# Patient Record
Sex: Female | Born: 1945 | Race: White | Hispanic: Yes | Marital: Married | Smoking: Never smoker
Health system: Southern US, Community
[De-identification: ages and names within clinical notes are randomized; demographics above are authoritative.]

## PROBLEM LIST (undated history)

## (undated) DIAGNOSIS — E119 Type 2 diabetes mellitus without complications: Secondary | ICD-10-CM

## (undated) DIAGNOSIS — I10 Essential (primary) hypertension: Secondary | ICD-10-CM

## (undated) DIAGNOSIS — E785 Hyperlipidemia, unspecified: Secondary | ICD-10-CM

## (undated) DIAGNOSIS — R569 Unspecified convulsions: Secondary | ICD-10-CM

## (undated) DIAGNOSIS — I639 Cerebral infarction, unspecified: Secondary | ICD-10-CM

## (undated) HISTORY — PX: REPLACEMENT TOTAL KNEE: SUR1224

## (undated) HISTORY — PX: HX BACK SURGERY: SHX140

## (undated) HISTORY — DX: Unspecified convulsions: R56.9

## (undated) HISTORY — PX: VEIN LIGATION: SHX2652

## (undated) HISTORY — PX: HX APPENDECTOMY: SHX54

## (undated) HISTORY — DX: Cerebral infarction, unspecified: I63.9

## (undated) HISTORY — PX: SHOULDER SURGERY: SHX246

## (undated) HISTORY — PX: JOINT REPLACEMENT: SHX530

---

## 2008-04-20 ENCOUNTER — Ambulatory Visit (HOSPITAL_COMMUNITY): Payer: Self-pay | Admitting: OBSTETRICS/GYNECOLOGY

## 2016-10-28 ENCOUNTER — Other Ambulatory Visit (INDEPENDENT_AMBULATORY_CARE_PROVIDER_SITE_OTHER): Payer: Self-pay | Admitting: Family Medicine

## 2016-10-28 MED ORDER — SITAGLIPTIN PHOSPHATE 50 MG-METFORMIN 1,000 MG TABLET
1.00 | ORAL_TABLET | Freq: Two times a day (BID) | ORAL | 3 refills | Status: DC
Start: 2016-10-28 — End: 2017-10-29

## 2016-10-28 NOTE — Telephone Encounter (Signed)
Regarding: refill  ----- Message from Johnny Bridge sent at 10/27/2016 11:46 AM EDT -----  Means    janument    Preferred Pharmacy     [facility] - [place], [place] - [address]    [address] [place] [address]    Phone: [phone] Fax: [phone]    Not a 24 hour pharmacy; exact hours not known

## 2016-11-27 ENCOUNTER — Other Ambulatory Visit (INDEPENDENT_AMBULATORY_CARE_PROVIDER_SITE_OTHER): Payer: Self-pay | Admitting: Family Medicine

## 2016-12-17 ENCOUNTER — Encounter (INDEPENDENT_AMBULATORY_CARE_PROVIDER_SITE_OTHER): Payer: Self-pay | Admitting: Family Medicine

## 2016-12-17 DIAGNOSIS — I10 Essential (primary) hypertension: Secondary | ICD-10-CM

## 2016-12-17 DIAGNOSIS — E039 Hypothyroidism, unspecified: Secondary | ICD-10-CM

## 2016-12-17 DIAGNOSIS — E782 Mixed hyperlipidemia: Secondary | ICD-10-CM

## 2016-12-17 DIAGNOSIS — G4733 Obstructive sleep apnea (adult) (pediatric): Secondary | ICD-10-CM

## 2016-12-17 DIAGNOSIS — E119 Type 2 diabetes mellitus without complications: Secondary | ICD-10-CM

## 2016-12-17 HISTORY — DX: Obstructive sleep apnea (adult) (pediatric): G47.33

## 2016-12-17 HISTORY — DX: Essential (primary) hypertension: I10

## 2016-12-17 HISTORY — DX: Mixed hyperlipidemia: E78.2

## 2016-12-17 HISTORY — DX: Type 2 diabetes mellitus without complications: E11.9

## 2016-12-17 HISTORY — DX: Hypothyroidism, unspecified: E03.9

## 2016-12-19 ENCOUNTER — Ambulatory Visit (INDEPENDENT_AMBULATORY_CARE_PROVIDER_SITE_OTHER): Payer: Medicare Other | Admitting: Family Medicine

## 2016-12-19 ENCOUNTER — Encounter (INDEPENDENT_AMBULATORY_CARE_PROVIDER_SITE_OTHER): Payer: Self-pay | Admitting: Family Medicine

## 2016-12-19 VITALS — BP 134/84 | HR 68 | Temp 98.0°F | Resp 16 | Ht 66.0 in | Wt 213.0 lb

## 2016-12-19 DIAGNOSIS — M25571 Pain in right ankle and joints of right foot: Secondary | ICD-10-CM

## 2016-12-19 DIAGNOSIS — G4733 Obstructive sleep apnea (adult) (pediatric): Secondary | ICD-10-CM

## 2016-12-19 DIAGNOSIS — Z23 Encounter for immunization: Secondary | ICD-10-CM

## 2016-12-19 DIAGNOSIS — G8929 Other chronic pain: Secondary | ICD-10-CM

## 2016-12-19 DIAGNOSIS — I10 Essential (primary) hypertension: Secondary | ICD-10-CM

## 2016-12-19 DIAGNOSIS — Z1239 Encounter for other screening for malignant neoplasm of breast: Secondary | ICD-10-CM

## 2016-12-19 DIAGNOSIS — E119 Type 2 diabetes mellitus without complications: Secondary | ICD-10-CM

## 2016-12-19 DIAGNOSIS — Z1231 Encounter for screening mammogram for malignant neoplasm of breast: Secondary | ICD-10-CM

## 2016-12-19 DIAGNOSIS — E039 Hypothyroidism, unspecified: Secondary | ICD-10-CM

## 2016-12-19 DIAGNOSIS — E782 Mixed hyperlipidemia: Principal | ICD-10-CM

## 2016-12-19 HISTORY — DX: Other chronic pain: G89.29

## 2016-12-19 HISTORY — DX: Pain in right ankle and joints of right foot: M25.571

## 2016-12-19 NOTE — Progress Notes (Signed)
[facility]  [address]  [place] [address]  Office Visit    Name: Linda [name]   DOB: [birth year]  Date of Service: 12/19/2016     Chief Complaint(s):   Chief Complaint   Patient presents with   . Diabetes Follow up   . Hypertension   . Hyperlipidemia   . Hypothyroidism       SUBJECTIVE:  She has been doing well and she has been really trying to watch her diet and she has been staying active. She states that her bs has been very good at home and she denies any hypo or hyperglycemic episodes. She denies any issues with the medications. She is having the pain at the right foot. She has seen ortho for this and she did have an injection and she did also have pt but still no relief. She states that th ankle feels like it is burning and this is constant pain. The pain is below the ankle and posteriorly. She denies any cp or sob. She did have the labs done and her labs are stable but they did not do an a1c this time but her last a1c was 6.2.    ROS:  Constitutional: Denies fevers, chills, night sweats. No recent weight changes or fatigue.   Eyes: Denies change in vision. No irritation, erythema, discharge or pain.   Ears: Denies any difficulty with hearing. No ear pain, tinnitus or discharge.  Mouth/Throat: Denies any oral ulcers or other lesions. No sore throat, hoarseness or dysphagia.  Cardiovascular: Denies any chest pain, palpitations, PND or DOE  Respiratory: Denies any shortness of breath, wheezing, cough   GI: Denies any abdominal pain, nausea, vomiting, diarrhea or constipation. No melena or hematochezia.  GU: Denies any dysuria, frequency, hematuria, hesitancy. Denies nocturia or incontinence.  Musculoskeletal: Does have the right ankle pain and burning.  Neurological: No history of syncope or seizures. Denies any dizziness or headaches.  Emotional/Psychiatric: Denies any memory issues. No history of depression or anxiety.  Skin: Denies any recent rashes or lesions.     Past Medical  History:  Patient Active Problem List    Diagnosis Date Noted   . Chronic pain of right ankle 12/19/2016   . Type 2 diabetes mellitus without complication, without long-term current use of insulin (CMS HCC) 12/17/2016   . Mixed hyperlipidemia 12/17/2016   . Essential hypertension, benign 12/17/2016   . Obstructive sleep apnea 12/17/2016   . Hypothyroidism, adult 12/17/2016       Medications:  Current Outpatient Prescriptions   Medication Sig   . glimepiride (AMARYL) 2 mg Oral Tablet take 1 tablet by mouth once daily   . levothyroxine (SYNTHROID) 125 mcg Oral Tablet Take 125 mcg by mouth Once a day   . losartan (COZAAR) 50 mg Oral Tablet Take 50 mg by mouth Once a day   . pravastatin (PRAVACHOL) 80 mg Oral Tablet Take 80 mg by mouth Once a day   . sitaGLIPtin-metformin (JANUMET) 50-1,000 mg Oral Tablet Take 1 Tab by mouth Twice daily with food        Allergies:  No Known Allergies     Social History:  Social History   Substance Use Topics   . Smoking status: Never Smoker   . Smokeless tobacco: Never Used   . Alcohol use No        Family History:  Family Medical History:     Problem Relation (Age of Onset)    Leukemia Mother  Lymphoma Father              Objective:  BP 134/84  Pulse 68  Temp 36.7 C (98 F)  Resp 16  Ht 1.676 m ( )  Wt 96.6 kg (213 lb)  SpO2 98%  BMI 34.38 kg/m2     Physical Exam:  General: No apparent acute distress. Very pleasant.   Eyes: Conjunctiva are clear. No discharge or icterus noted.  HENT: Mucous membranes are moist. Nares are clear. Posterior oropharynx is clear without erythema or exudates.    Neck: Supple.  Thyroid palpated as normal  Lungs: Clear to auscultation bilaterally. Good air movement. No wheezes or rhonchi.    Cardiovascular: Normal rate and regular rhythm. No murmurs, rubs or gallops.  Abdomen: Soft. BS present. Non-tender. No rebound, guarding, or peritoneal signs.   Extremities: Atraumatic. No cyanosis. No significant peripheral edema.  Skin: Warm and dry. No  significant rashes or lesions  Neurologic: Strength and sensation grossly normal throughout. Normal gait.    Psychiatric: Alert and oriented x 3. Affect within normal limits.  Musculoskeletal: Extremities intact x 4. Spine full range of motion without restrictions. No gross deformities.  BMI addressed: Advised on diet, weight loss, and exercise to reduce above normal BMI.              POCT Results:                 I have reviewed and confirmed the above point of care results.    Assessment and Plan:    ICD-10-CM    1. Mixed hyperlipidemia E78.2 Lipid Panel   2. Essential hypertension, benign I10 CBC     Comp Metabolic Panel- Fasting   3. Obstructive sleep apnea G47.33    4. Type 2 diabetes mellitus without complication, without long-term current use of insulin (CMS HCC) E11.9 A1C with Estimated Average Glucose   5. Hypothyroidism, adult E03.9    6. Chronic pain of right ankle M25.571     G89.29    7. Screening for breast cancer Z12.31 Mammogram: Screening Bilateral       Orders Placed This Encounter   . Mammogram: Screening Bilateral   . A1C with Estimated Average Glucose   . CBC   . Comp Metabolic Panel- Fasting   . Lipid Panel           We did review patient diabetes status and need for continued diet, exercise and regular follow up with labs and A1c. We will repeat the a1c as this was not done on the current labs. We did rec yearly ophthalmology exams and also regular foot exam and did review foot care. Patient will continue to monitor blood glucose and call with any issues or concerns. She will cont f/u with orthopedics for the ankle problems. She will have the labs prior to her next visit. Patient has been [facility] patient with [facility] and has been seen within the previous 3 years.        Algis Downs [employer] DO 12/19/2016, 08:45        Portions of this note may be dictated using voice recognition software. Variances in spelling and vocabulary are possible and unintentional. Not all errors are  caught/corrected. Please notify the Thereasa Parkin if any discrepancies are noted or if the meaning of any statement is not clear.

## 2016-12-19 NOTE — Nursing Note (Signed)
Patient here for check up doing well she tries to watch diet and stay active  she denies chest pain or sob she does c/o right ankle pain seeing ortho Solmon Ice, MA  12/19/2016, 08:22

## 2016-12-19 NOTE — Nurses Notes (Signed)
Patient received vaccine in clinic.  Tolerated it well, given VIS sheet and was discharged to home.  Immunization administered     Name Date Dose VIS Date Route    INFLUENZA VACCINE IM 12/19/2016 0.5 mL 10/14/2013 Intramuscular    Site: Right deltoid    Given By: Solmon Ice, MA    Manufacturer: [employer]    Lot: [id number]    NDC: [id number]    Prevnar 13 (Admin) 12/19/2016 0.5 mL 07/22/2016 Intramuscular    Site: Right deltoid    Given By: Solmon Ice, MA    Manufacturer: [employer]    Lot: [id number]    NDC: [id number]            Solmon Ice, MA  12/19/2016, 17:10

## 2017-03-04 ENCOUNTER — Other Ambulatory Visit (INDEPENDENT_AMBULATORY_CARE_PROVIDER_SITE_OTHER): Payer: Self-pay | Admitting: Family Medicine

## 2017-05-27 ENCOUNTER — Ambulatory Visit (INDEPENDENT_AMBULATORY_CARE_PROVIDER_SITE_OTHER): Payer: Self-pay | Admitting: Family Medicine

## 2017-05-27 NOTE — Telephone Encounter (Signed)
-----   Message from Ronette Deteronya Baker sent at 05/27/2017 11:22 AM EDT -----  Jacob MooresPaul E Means Jr., DO    Pt called for refill, states she is completely out and pharmacy has sent a fax for this refill.  Thank you     levothyroxine (SYNTHROID) 125 mcg Oral Tablet   08/02/2016    Sig - Route: Take 125 mcg by mouth Once a day - Oral     Preferred Pharmacy     [facility] - [place], [place] - [address]    [address] [place] [id number]    Phone: [phone] Fax: [phone]    Not a 24 hour pharmacy; exact hours not known

## 2017-05-28 MED ORDER — LEVOTHYROXINE 125 MCG TABLET
125.0000 ug | ORAL_TABLET | Freq: Every day | ORAL | 1 refills | Status: DC
Start: 2017-05-28 — End: 2017-11-13

## 2017-05-29 ENCOUNTER — Other Ambulatory Visit (INDEPENDENT_AMBULATORY_CARE_PROVIDER_SITE_OTHER): Payer: Self-pay | Admitting: Family Medicine

## 2017-10-29 ENCOUNTER — Other Ambulatory Visit (INDEPENDENT_AMBULATORY_CARE_PROVIDER_SITE_OTHER): Payer: Self-pay | Admitting: Family Medicine

## 2017-11-11 ENCOUNTER — Other Ambulatory Visit (INDEPENDENT_AMBULATORY_CARE_PROVIDER_SITE_OTHER): Payer: Self-pay | Admitting: Family Medicine

## 2017-11-13 ENCOUNTER — Other Ambulatory Visit (INDEPENDENT_AMBULATORY_CARE_PROVIDER_SITE_OTHER): Payer: Self-pay | Admitting: Family Medicine

## 2017-11-23 ENCOUNTER — Ambulatory Visit (INDEPENDENT_AMBULATORY_CARE_PROVIDER_SITE_OTHER): Payer: No Typology Code available for payment source

## 2017-11-23 ENCOUNTER — Encounter (FREE_STANDING_LABORATORY_FACILITY): Payer: Medicare Other | Admitting: Family Medicine

## 2017-11-23 ENCOUNTER — Encounter (FREE_STANDING_LABORATORY_FACILITY)
Admit: 2017-11-23 | Discharge: 2017-11-23 | Disposition: A | Discharge status: Home / Self Care | Payer: Medicare Other | Attending: Family Medicine | Admitting: Family Medicine

## 2017-11-23 DIAGNOSIS — E782 Mixed hyperlipidemia: Secondary | ICD-10-CM

## 2017-11-23 DIAGNOSIS — E119 Type 2 diabetes mellitus without complications: Secondary | ICD-10-CM | POA: Insufficient documentation

## 2017-11-23 DIAGNOSIS — I10 Essential (primary) hypertension: Secondary | ICD-10-CM

## 2017-11-23 LAB — CBC
HCT: 42.6 % (ref 34.8–46.0)
HGB: 13.7 g/dL (ref 11.5–16.0)
MCH: 30.5 pg (ref 26.0–32.0)
MCHC: 32.2 g/dL (ref 31.0–35.5)
MCV: 94.9 fL (ref 78.0–100.0)
MPV: 9.9 fL (ref 8.7–12.5)
PLATELETS: 246 x10ˆ3/uL (ref 150–400)
RBC: 4.49 x10ˆ6/uL (ref 3.85–5.22)
RDW-CV: 13.6 % (ref 11.5–15.5)
WBC: 5.6 x10ˆ3/uL (ref 3.7–11.0)

## 2017-11-23 LAB — COMPREHENSIVE METABOLIC PNL, FASTING
ALBUMIN: 4.5 g/dL (ref 3.4–4.8)
ALKALINE PHOSPHATASE: 64 U/L (ref 55–145)
ALT (SGPT): 15 U/L (ref <55)
ANION GAP: 6 mmol/L (ref 4–13)
AST (SGOT): 22 U/L (ref 8–41)
BILIRUBIN TOTAL: 0.6 mg/dL (ref 0.3–1.3)
BUN/CREA RATIO: 21 (ref 6–22)
BUN: 16 mg/dL (ref 8–25)
CALCIUM: 10.1 mg/dL (ref 8.5–10.2)
CHLORIDE: 109 mmol/L (ref 96–111)
CO2 TOTAL: 26 mmol/L (ref 22–32)
CREATININE: 0.78 mg/dL (ref 0.49–1.10)
ESTIMATED GFR: >59 mL/min/1.73m?2 (ref >59)
ESTIMATED GFR: >59 mL/min/{1.73_m2} (ref >59)
GLUCOSE: 122 mg/dL — ABNORMAL HIGH (ref 70–105)
POTASSIUM: 4.5 mmol/L (ref 3.5–5.1)
PROTEIN TOTAL: 7.5 g/dL (ref 6.0–8.0)
SODIUM: 141 mmol/L (ref 136–145)

## 2017-11-23 LAB — HGA1C (HEMOGLOBIN A1C WITH EST AVG GLUCOSE)
ESTIMATED AVERAGE GLUCOSE: 126 mg/dL
HEMOGLOBIN A1C: 6 % — ABNORMAL HIGH (ref 4.0–5.6)

## 2017-11-23 LAB — LIPID PANEL
CHOL/HDL RATIO: 4.3
CHOLESTEROL: 171 mg/dL (ref <200)
HDL CHOL: 40 mg/dL — ABNORMAL LOW (ref >=49)
LDL CALC: 100 mg/dL (ref <=100)
NON-HDL: 131 mg/dL (ref <=190)
TRIGLYCERIDES: 156 mg/dL — ABNORMAL HIGH (ref <=150)
VLDL CALC: 31 mg/dL — ABNORMAL HIGH (ref <30)

## 2017-11-23 NOTE — Nursing Note (Signed)
Department of Community Practice     Venipuncture performed in office on right arm antecubital vein, dry pressure dressing was applied to site and patient tolerated it well.  Specimen was centrifuged, aliquoted as needed and specimen was labeled and packaged for transport.  Garnet Overfield [place], MA  11/23/2017, 10:53

## 2017-11-24 ENCOUNTER — Other Ambulatory Visit (INDEPENDENT_AMBULATORY_CARE_PROVIDER_SITE_OTHER): Payer: Self-pay | Admitting: Family Medicine

## 2017-11-24 ENCOUNTER — Encounter (FREE_STANDING_LABORATORY_FACILITY): Payer: No Typology Code available for payment source | Attending: Family Medicine | Admitting: Family Medicine

## 2017-11-24 ENCOUNTER — Ambulatory Visit (INDEPENDENT_AMBULATORY_CARE_PROVIDER_SITE_OTHER): Payer: Medicare Other | Admitting: Family Medicine

## 2017-11-24 VITALS — BP 120/80 | HR 73 | Temp 98.0°F | Resp 16 | Ht 66.0 in | Wt 208.0 lb

## 2017-11-24 DIAGNOSIS — E119 Type 2 diabetes mellitus without complications: Secondary | ICD-10-CM

## 2017-11-24 DIAGNOSIS — E039 Hypothyroidism, unspecified: Secondary | ICD-10-CM

## 2017-11-24 DIAGNOSIS — G4733 Obstructive sleep apnea (adult) (pediatric): Secondary | ICD-10-CM

## 2017-11-24 DIAGNOSIS — I10 Essential (primary) hypertension: Secondary | ICD-10-CM

## 2017-11-24 DIAGNOSIS — E782 Mixed hyperlipidemia: Secondary | ICD-10-CM

## 2017-11-24 DIAGNOSIS — Z0000 Encounter for general adult medical examination without abnormal findings: Secondary | ICD-10-CM

## 2017-11-24 NOTE — Nursing Note (Signed)
11/24/17 1253   Medicare Wellness Assessment   Medicare initial or wellness physical in the last year? Yes   Advance Directives   Does patient have a living will or MPOA no   Has patient provided [facility] with a copy? no   Advance directive information given to the patient today? no   Activities of Daily Living   Do you need help with dressing, bathing, or walking? No   Do you need help with shopping, housekeeping, medications, or finances? No   Do you have rugs in hallways, broken steps, or poor lighting? No   Do you have grab bars in your bathroom, non-slip strips in your tub, and hand rails on your stairs? No   Urinary Incontinence Screen-Women only   Do you ever leak urine when you don't want to? YES   Cognitive Function Screen   What is you age? 1   What is the time to the nearest hour? 1   What is the year? 1   What is the name of this clinic? 1   Can the patient recognize two persons (the doctor, the nurse, home help, etc.)? 1   What is the date of your birth? (day and month sufficient)  1   In what year did World War II end? 0   Who is the current president of the [place]? 1   Count from 20 down to 1? 1   What address did I give you earlier? 0   Total Score 8   Depression Screen   Little interest or pleasure in doing things. 0   Feeling down, depressed, or hopeless 0   PHQ 2 Total 0   Hearing Screen   Have you noticed any hearing difficulties? Yes   After whispering 9-1-6 how many numbers did the patient repeat correctly? 1   After whispering 4-7-8 how many numbers did the patient repeat correctly? 1   Total Correct 2   Fall Risk Assessment   Do you feel unsteady when standing or walking? No   Do you worry about falling? No   Have you fallen in the past year? No   How many times have you fallen? Once   Were you ever injured from falling? Yes   Vision Screen   Right Eye = 20 30   Left Eye = 20 30

## 2017-11-24 NOTE — Progress Notes (Signed)
[facility]  [address]  [place] [id number]  [facility]  Medicare Annual Wellness Visit    Name: Linda MartiniCarol Bayley MRN:  [id number]   Date: 11/24/2017 Age: 72 y.o.       SUBJECTIVE:   Linda [name] is a 72 y.o. female for presenting for Medicare Wellness exam.   I have reviewed and reconciled the medication list with the patient today.  DM: Fasting FS range 100   Nonfasting FS range 120   Hypoglycemia episodes  No   Compliant with diabetic diet Yes   Sores on feet No  Diabetes Monitors  A1C: 6  A1C Date: 11/23/2017          Urine Microalbumin: Not Found   Microalbumin Date: Not Found    Last Lipid Panel  (Last result in the past 2 years)      Cholesterol   HDL   LDL   Direct LDL   Triglycerides      11/23/17 0904 171 40 100   156        Retinal Exam Date: Not Found  Last diabetic foot exam: Not Found  HTN: HAs No    Dizziness No   Feeling like BP is too high or low No   Compliant with taking meds Yes   Home BP:  not doing  HLD: at goal     "healthy" diet  in general   Is pt fasting today? No  She has been doing much better and she is trying to lose weight as she is going to have tkra done in October. She is having increased pain at the knee and this is limiting her activity. Her bs have really improved and her a1c has been excellent. She denies any issues with the medications.   I have reviewed and updated as appropriate the past medical, family and social history. 11/24/2017 as summarized below:  Past Medical History:   Diagnosis Date   . Chronic pain of right ankle 12/19/2016   . Essential hypertension, benign 12/17/2016   . Hypothyroidism, adult 12/17/2016   . Mixed hyperlipidemia 12/17/2016   . Obstructive sleep apnea 12/17/2016   . Type 2 diabetes mellitus without complication, without long-term current use of insulin (CMS HCC) 12/17/2016     Past Surgical History:   Procedure Laterality Date   . Hx appendectomy     . Hx back surgery     . Shoulder surgery     . Vein ligation        Current Outpatient Medications   Medication Sig   . glimepiride (AMARYL) 2 mg Oral Tablet take 1 tablet by mouth once daily   . JANUMET 50-1,000 mg Oral Tablet take 1 tablet by mouth twice a day with food   . levothyroxine (SYNTHROID) 125 mcg Oral Tablet take 1 tablet by mouth once daily   . losartan (COZAAR) 50 mg Oral Tablet take 1 tablet by mouth once daily   . pravastatin (PRAVACHOL) 80 mg Oral Tablet take 1 tablet by mouth once daily     Family Medical History:     Problem Relation (Age of Onset)    Leukemia Mother    Lymphoma Father            Social History     Socioeconomic History   . Marital status: Married     Spouse name: Not on file   . Number of children: Not on file   . Years of education: Not on  file   . Highest education level: Not on file   Tobacco Use   . Smoking status: Never Smoker   . Smokeless tobacco: Never Used   Substance and Sexual Activity   . Alcohol use: No     Review of Systems: Pertinent items are noted in HPI.     List of Current Health Care Providers   Care Team     PCP     Name Type Specialty Phone Number    Means, Hipolito Bayley., DO Physician [facility] [phone]          Care Team     No care team found                  Health Maintenance   Topic Date Due   . Adult Tdap-Td (1 - Tdap) 04/20/1965   . Hepatitis C screening antibody  04/21/1991   . Mammography  04/20/1996   . Shingles Vaccine (1 of 2) 04/20/1996   . Colonoscopy  04/20/1996   . Osteoporosis screening  04/21/2011   . Annual Wellness Exam  10/22/2016   . Influenza Vaccine (1) 11/08/2017   . Pneumococcal 65+ Years Low Risk (2 of 2 - PPSV23) 12/19/2017   . Depression Screening  12/19/2017     Medicare Wellness Assessment   Medicare initial or wellness physical in the last year?: Yes  Advance Directives (optional)   Does patient have a living will or MPOA: no   Has patient provided [employer] with a copy?: no   Advance directive information given to the patient today?: no      Activities of Daily Living   Do  you need help with dressing, bathing, or walking?: No   Do you need help with shopping, housekeeping, medications, or finances?: No   Do you have rugs in hallways, broken steps, or poor lighting?: No   Do you have grab bars in your bathroom, non-slip strips in your tub, and hand rails on your stairs?: No   Urinary Incontinence Screen (Women >=65 only)   Do you ever leak urine when you don't want to?: YES   Cognitive Function Screen (1=Yes, 0=No)   What is you age?: Correct   What is the time to the nearest hour?: Correct   What is the year?: Correct   What is the name of this clinic?: Correct   Can the patient recognize two persons (the doctor, the nurse, home help, etc.)?: Correct   What is the date of your birth? (day and month sufficient) : Correct   In what year did World War II end?: Incorrect   Who is the current president of the [place]?: Correct   Count from 20 down to 1?: Correct   What address did I give you earlier?: Incorrect   Total Score: 8       Hearing Screen   Have you noticed any hearing difficulties?: Yes  After whispering 9-1-6 how many numbers did the patient repeat correctly?: 1  After whispering 4-7-8 how many numbers did the patient repeat correctly?: 1   Fall Risk Screen   Do you feel unsteady when standing or walking?: No  Do you worry about falling?: No  Have you fallen in the past year?: No  How many times have you fallen?: Once  Were you ever injured from falling?: Yes   Vision Screen   Right Eye = 20: 30   Left Eye = 20: 30   Depression Screen  Little interest or pleasure in doing things.: Not at all  Feeling down, depressed, or hopeless: Not at all  PHQ 2 Total: 0        OBJECTIVE:   BP 120/80   Pulse 73   Temp 36.7 C (98 F)   Resp 16   Ht 1.676 m (5\' 6" )   Wt 94.3 kg (208 lb)   SpO2 97%   BMI 33.57 kg/m        Other appropriate exam:  General: Patient is very pleasant and cooperative and in no acute distress.  HEENT: Perl, eomi, tm intact. Nares clear. Oral mucosa  pink and moist. No Lesions. TM intact bilaterally.  Neck: Soft, Supple, no masses, Thyroid palpated and not enlarged.  Heart: Regular rate and rhythm and no murmurs, clicks or rubs.  Lungs: Clear to auscultation bilaterally. Good air movement. No wheezing or rhochi.  Abdomen: Soft, non tender, no masses , no organomegaly. + normoactive/ normopitch bowel sounds.  Extremities: Intact x 4. no gross deformity and no edema.  Musculoskeletal: knees with decreased rom and joint line tenderness and pain with rom  Neurologic: Strength and sensation grossly equal and appropriate. No focal deficits noted.  Skin: No lesions or rashes noted.    Health Maintenance Due   Topic Date Due   . Adult Tdap-Td (1 - Tdap) 04/20/1965   . Hepatitis C screening antibody  04/21/1991   . Mammography  04/20/1996   . Shingles Vaccine (1 of 2) 04/20/1996   . Colonoscopy  04/20/1996   . Osteoporosis screening  04/21/2011   . Annual Wellness Exam  10/22/2016   . Influenza Vaccine (1) 11/08/2017      ASSESSMENT & PLAN:    Identified Risk Factors/ Recommended Actions     ICD-10-CM    1. Medicare annual wellness visit, subsequent Z00.00    2. Mixed hyperlipidemia E78.2 Lipid Panel   3. Essential hypertension, benign I10 CBC     Comp Metabolic Panel- Fasting   4. Obstructive sleep apnea G47.33    5. Type 2 diabetes mellitus without complication, without long-term current use of insulin (CMS HCC) E11.9 Hemoglobin A1C   6. Hypothyroidism, adult E03.9 TSH Sensitive     Fall Risk Follow up plan of care: Footwear and potential problems addressed    Urinary Incontinence Plan of Care: Behavioral interventions with bladder training, pelvic floor muscle training, and/or prompted voiding  Orders Placed This Encounter   . CBC   . Comp Metabolic Panel- Fasting   . Lipid Panel   . Hemoglobin A1C   . TSH Sensitive          The patient has been educated about risk factors and recommended preventive care. Written Prevention Plan completed/ updated and given to patient  (see After Visit Summary).  We did review patient diabetes status and need for continued diet, exercise and regular follow up with labs and A1c. Her a1c is excellent and her other labs are stable. I did rec that continue her current medications. She will continue follow up with orthopedics for her knee oa and will have the right tkra. We did rec yearly ophthalmology exams and also regular foot exam and did review foot care. Patient will continue to monitor blood glucose and call with any issues or concerns.  Return in about 3 months (around 02/23/2018).    Jacob Moores., DO  [facility]  [facility]  [address]  [place] [address]  [phone]

## 2017-11-24 NOTE — Patient Instructions (Signed)
Medicare Preventive Services  Medicare coverage information Recommendation for YOU   Heart Disease and Diabetes   Lipid profile every 5 years or more often if at risk for cardiovascular disease  Last Lipid Panel  (Last result in the past 2 years)      Cholesterol   HDL   LDL   Direct LDL   Triglycerides      11/23/17 0904 171 40 100   156         Diabetes Screening  yearly for those at risk for diabetes, 2 tests per year for those with prediabetes Last Glucose: 122    Diabetes Self Management Training or Medical Nutrition Therapy  for those with diabetes, up to 10 hrs initial training within a year, subsequent years up to 2 hrs of follow up training Optional for those with diabetes     Medical Nutrition Therapy three hours of one-on-one counseling in first year, two hours in subsequent years Optional for those with diabetes, kidney disease   Intensive Behavioral Therapy for Obesity  Face-to-face counseling, first month every week, month 2-6 every other week, month 7-12 every month if continued progress is documented Optional for those with Body Mass Index 30 or higher  Your Body mass index is 33.57 kg/m.   Tobacco Cessation (Quitting) Counseling   two attempts per year, max 4 sessions per attempt, up to 8 per year, for those with tobacco-related health condition Optional for those that use tobacco   Cancer Screening   Colorectal screening   for anyone age 40 to 2 or any age if high risk:  . Screening Colonoscopy every 10 yrs if low risk,  more frequent if higher risk  OR  . Flexible  Sigmoidoscopy  every 5 yr OR  . Fecal Occult Blood Testing yearly OR  . Cologuard Stool DNA test once every 3 years OR  . CT Colonography every 5 yrs    See your schedule below   Screening Pap Test recommended every 3 years for all women age 28 to 55, or every five years if combined with HPV test (routine screening not needed after total hysterectomy).  Medicare covers every 2 years, up to yearly if high risk.  Screening Pelvic Exam  Medicare covers every 2 years, yearly if high risk or childbearing age with abnormal Pap in last 3 yrs. See your schedule below   Screening Mammogram   Recommended every 1-2 years for women age 27 to 66, and selectively recommended for women between 82-49 based on shared decisions about risk. Covered by Medicare up to every year for women age 72 or older See your schedule below   Lung Cancer Screening  Annual low dose computed tomography (LDCT scan) is recommended for those age 3-77 who smoked 30 pack-years and are current smokers or quit smoking within past 15 years (one pack-year= smoking one PPD for one year), after counseling by your doctor or nurse clinician about the possible benefits or harms See your schedule below   Vaccinations   Pneumococcal Vaccine recommended routinely age 55+ with two separate vaccines one year apart (Prevnar then Pneumovax).  Recommended before age 71 if medical conditions increase risk  Seasonal Influenza Vaccine once every flu season   Hepatitis B Vaccine 3 doses if risk (including anyone with diabetes or liver disease)  Shingles Vaccine Once or twice at age 89 or older  Diphtheria Tetanus Pertussis Vaccine ONCE as adult, booster every 10 years     Immunization History   Administered  Date(s) Administered   . Influenza Vaccine IM (ADMIN) 12/19/2016   . Prevnar 13 (Admin) 12/19/2016     Shingles vaccine and Diphtheria Tetanus Pertussis vaccines are available at pharmacies or local health department without a prescription.   Other Screening   Bone Densitometry   every 24 months for anyone at risk, including postmenopausal       Glaucoma Screening   yearly if in high risk group such as diabetes, family history, African American age 71+ or Hispanic American age 70+      Hepatitis C Screening recommended ONCE for those born between 1945-1965, or high risk for HCV infection     HIV Testing   yearly or up to 3 times in pregnancy     Abdominal Aortic Aneurysm Screening Ultrasound   once  with Initial Welcome to Medicare Physical within 12 months of coverage if  65 + with family history of AAA       Your Personalized Schedule for Preventive Tests   Health Maintenance: Pending and Last Completed       Date Due Completion Date    Adult Tdap-Td (1 - Tdap) 04/20/1965 ---    Hepatitis C screening antibody 04/21/1991 ---    Mammography 04/20/1996 ---    Shingles Vaccine (1 of 2) 04/20/1996 ---    Colonoscopy 04/20/1996 ---    Osteoporosis screening 04/21/2011 ---    Annual Wellness Exam 10/22/2016 ---    Influenza Vaccine (1) 11/08/2017 12/19/2016    Pneumococcal 65+ Years Low Risk (2 of 2 - PPSV23) 12/19/2017 12/19/2016    Depression Screening 12/19/2017 12/19/2016

## 2017-11-30 ENCOUNTER — Other Ambulatory Visit (INDEPENDENT_AMBULATORY_CARE_PROVIDER_SITE_OTHER): Payer: Self-pay | Admitting: Family Medicine

## 2017-12-04 ENCOUNTER — Ambulatory Visit (INDEPENDENT_AMBULATORY_CARE_PROVIDER_SITE_OTHER): Payer: Self-pay | Admitting: Family Medicine

## 2017-12-04 ENCOUNTER — Ambulatory Visit (INDEPENDENT_AMBULATORY_CARE_PROVIDER_SITE_OTHER): Payer: Self-pay | Admitting: Family

## 2017-12-04 NOTE — Telephone Encounter (Signed)
faxed.    Starleen Arms, Ambulatory Care Assistant  12/04/2017, 13:27

## 2017-12-04 NOTE — Telephone Encounter (Signed)
Please advise, thank you.    Starleen Arms, Ambulatory Care Assistant  12/04/2017, 13:16

## 2017-12-04 NOTE — Telephone Encounter (Signed)
Regarding: Fax   ----- Message from Jill Alexanders sent at 12/04/2017  1:07 PM EDT -----  Jacob Moores., DO  DR. Dijoya's office  Was Calling to find out if patient was cleared for surgery, She saw Dr. Charlestine Night on the 17th of this month     Patient is Scheduled for a total knee replacement on  October 22nd  And they were needing office notes from her visit on the 17th.     Fax [phone]

## 2018-01-07 ENCOUNTER — Other Ambulatory Visit (INDEPENDENT_AMBULATORY_CARE_PROVIDER_SITE_OTHER): Payer: Self-pay | Admitting: Family Medicine

## 2018-02-09 ENCOUNTER — Other Ambulatory Visit (INDEPENDENT_AMBULATORY_CARE_PROVIDER_SITE_OTHER): Payer: Self-pay | Admitting: Family Medicine

## 2018-02-10 ENCOUNTER — Other Ambulatory Visit (INDEPENDENT_AMBULATORY_CARE_PROVIDER_SITE_OTHER): Payer: Self-pay | Admitting: Family Medicine

## 2018-02-22 ENCOUNTER — Other Ambulatory Visit (INDEPENDENT_AMBULATORY_CARE_PROVIDER_SITE_OTHER): Payer: Self-pay | Admitting: Family Medicine

## 2018-03-07 ENCOUNTER — Other Ambulatory Visit (INDEPENDENT_AMBULATORY_CARE_PROVIDER_SITE_OTHER): Payer: Self-pay | Admitting: Family Medicine

## 2018-04-09 ENCOUNTER — Other Ambulatory Visit (INDEPENDENT_AMBULATORY_CARE_PROVIDER_SITE_OTHER): Payer: Self-pay | Admitting: Family Medicine

## 2018-04-12 ENCOUNTER — Other Ambulatory Visit (INDEPENDENT_AMBULATORY_CARE_PROVIDER_SITE_OTHER): Payer: Self-pay | Admitting: Family Medicine

## 2018-05-11 ENCOUNTER — Other Ambulatory Visit (INDEPENDENT_AMBULATORY_CARE_PROVIDER_SITE_OTHER): Payer: Self-pay | Admitting: Family Medicine

## 2018-05-13 ENCOUNTER — Other Ambulatory Visit (INDEPENDENT_AMBULATORY_CARE_PROVIDER_SITE_OTHER): Payer: Self-pay | Admitting: Family Medicine

## 2018-05-14 ENCOUNTER — Other Ambulatory Visit (INDEPENDENT_AMBULATORY_CARE_PROVIDER_SITE_OTHER): Payer: Self-pay | Admitting: Family

## 2018-05-22 ENCOUNTER — Other Ambulatory Visit (INDEPENDENT_AMBULATORY_CARE_PROVIDER_SITE_OTHER): Payer: Self-pay | Admitting: Family Medicine

## 2018-05-24 ENCOUNTER — Other Ambulatory Visit (INDEPENDENT_AMBULATORY_CARE_PROVIDER_SITE_OTHER): Payer: Self-pay | Admitting: Family Medicine

## 2018-05-24 MED ORDER — BLOOD SUGAR DIAGNOSTIC STRIPS
ORAL_STRIP | 3 refills | Status: DC
Start: 2018-05-24 — End: 2020-10-23

## 2018-05-24 NOTE — Telephone Encounter (Signed)
-----   Message from Gerhard Munch sent at 05/24/2018  9:54 AM EDT -----  Jacob Moores., DO    Refills    Contour next test strips    Preferred Pharmacy     [facility] - [place], [place] - [address]    [address] [place] [address]    Phone: [phone] Fax: [phone]    Not a 24 hour pharmacy; exact hours not known.

## 2018-05-25 ENCOUNTER — Encounter (INDEPENDENT_AMBULATORY_CARE_PROVIDER_SITE_OTHER): Payer: Self-pay

## 2018-05-25 ENCOUNTER — Ambulatory Visit (INDEPENDENT_AMBULATORY_CARE_PROVIDER_SITE_OTHER): Payer: Self-pay | Admitting: Family Medicine

## 2018-06-14 ENCOUNTER — Other Ambulatory Visit (INDEPENDENT_AMBULATORY_CARE_PROVIDER_SITE_OTHER): Payer: Self-pay | Admitting: Family

## 2018-06-14 NOTE — Nursing Note (Signed)
Last scheduled appointment with you was Visit date not found.  Currently scheduled future appointment is Visit date not found.      Mertie Clause, MA  06/14/2018, 10:25

## 2018-06-17 ENCOUNTER — Ambulatory Visit (INDEPENDENT_AMBULATORY_CARE_PROVIDER_SITE_OTHER): Payer: Self-pay | Admitting: Family Medicine

## 2018-06-17 NOTE — Telephone Encounter (Signed)
Regarding: Consult   ----- Message from Jill Alexanders sent at 06/17/2018  8:50 AM EDT -----  Jacob Moores., DO    DWO form for medicare please fax back as soon as possible so they can dispense medication     Blood Sugar Diagnostic (CONTOUR NEXT TEST STRIPS) Strip 100 Strip 3 05/24/2018    Sig: check BS twice a day   Sent to pharmacy as: blood sugar diagnostic strips (Contour Next Test Strips)   Class: E-Rx   Notes to Pharmacy: dx e11.65 and npi [id number]   Non-formulary Exception Code: RXHUB/No Formulary Info Available       Preferred Pharmacy     [facility] - [place], [place] - [address]    [address] [place] [address]    Phone: [phone] Fax: [phone]    Not a 24 hour pharmacy; exact hours not known.

## 2018-06-21 ENCOUNTER — Other Ambulatory Visit (INDEPENDENT_AMBULATORY_CARE_PROVIDER_SITE_OTHER): Payer: Self-pay | Admitting: Family Medicine

## 2018-06-21 NOTE — Nursing Note (Signed)
Last scheduled appointment with you was Visit date not found.  Currently scheduled future appointment is Visit date not found.      Mertie Clause, MA  06/21/2018, 17:39

## 2018-07-06 ENCOUNTER — Other Ambulatory Visit (INDEPENDENT_AMBULATORY_CARE_PROVIDER_SITE_OTHER): Payer: Self-pay | Admitting: Family Medicine

## 2018-07-06 MED ORDER — GLIMEPIRIDE 2 MG TABLET
ORAL_TABLET | ORAL | 3 refills | Status: DC
Start: 2018-07-06 — End: 2018-08-30

## 2018-07-14 ENCOUNTER — Other Ambulatory Visit (INDEPENDENT_AMBULATORY_CARE_PROVIDER_SITE_OTHER): Payer: Self-pay | Admitting: Family Medicine

## 2018-07-14 NOTE — Telephone Encounter (Signed)
Pt LOV 11/24/2017.       Starleen Arms, Ambulatory Care Assistant  07/14/2018, 09:50

## 2018-07-26 ENCOUNTER — Telehealth (INDEPENDENT_AMBULATORY_CARE_PROVIDER_SITE_OTHER): Payer: Self-pay | Admitting: Family

## 2018-07-26 ENCOUNTER — Ambulatory Visit (INDEPENDENT_AMBULATORY_CARE_PROVIDER_SITE_OTHER): Payer: Self-pay | Admitting: Family Medicine

## 2018-07-26 NOTE — Telephone Encounter (Signed)
Called patient prior to appointment to prescreen for COVID-19.      Have you had new or worsened shortness of breath in the past 14 days?  No  Have you had a new or worsening cough in the past 14 days?  No  Have you had a fever in the past 14 days?  No  Have you experienced a loss of taste or smell in the past 14 days? No  Have you or someone you have been in close contact with, been test for COVID-19 or tested positive for COVID-19? No    Patient or patients guardian/attendant has a Negative Prescreen.  Instructed patient to proceed with appointment as planned.    Patient informed of visitor policy at this time.    Informed patient that we are requesting all patients wear a patient supplied mask when entering the clinic.     Appointment notes have been updated to reflect screening.    Patient instructed to present to the clinic for scheduled appointment    Linda [name]  07/26/2018, 13:35

## 2018-07-26 NOTE — Telephone Encounter (Signed)
I spoke with the patient and she is aware she can have blood work at her appointment tomorrow. Patient is also aware she can still schedule to see Dr. Charlestine Night tomorrow after her Appointment with Delice Bison if she needs to.  Earnest Rosier, [place]  07/26/2018, 08:52

## 2018-07-26 NOTE — Telephone Encounter (Signed)
Regarding: numb / swollen legs and feet   ----- Message from Lynnell Grain, [facility] sent at 07/23/2018  2:58 PM EDT -----  Pt would also like to do b/w when she comes in along with an order for lyme's.     ----- Message from Lynnell Grain, [facility] sent at 07/23/2018  2:57 PM EDT -----  Jacob Moores., DO    Pt called and scheduled an apt with Margo Aye for the 19th, But stated that she has surgery on her right knee in October, well the pt is having issues with both of her legs / feet are swollen and numb and wanted to discuss the with means.     Please call pt to advise ,thank you

## 2018-07-26 NOTE — Telephone Encounter (Signed)
Attempted to contact patient to complete COVID-19 prescreen prior to scheduled appointment. Unable to reach patient at this time, left message for patient to call back in and complete prescreening prior to appointment. Call back number provided [facility] [phone].   Gar Ponto  07/26/2018, 11:42

## 2018-07-27 ENCOUNTER — Ambulatory Visit (INDEPENDENT_AMBULATORY_CARE_PROVIDER_SITE_OTHER): Payer: Medicare Other | Admitting: Family

## 2018-07-27 ENCOUNTER — Encounter (FREE_STANDING_LABORATORY_FACILITY): Payer: Medicare Other | Admitting: Family

## 2018-07-27 ENCOUNTER — Encounter (INDEPENDENT_AMBULATORY_CARE_PROVIDER_SITE_OTHER): Payer: Self-pay | Admitting: Family

## 2018-07-27 ENCOUNTER — Encounter (FREE_STANDING_LABORATORY_FACILITY)
Admit: 2018-07-27 | Discharge: 2018-07-27 | Disposition: A | Discharge status: Home / Self Care | Payer: Medicare Other | Attending: Family | Admitting: Family

## 2018-07-27 VITALS — BP 136/92 | HR 68 | Temp 97.3°F | Resp 14 | Ht 66.5 in | Wt 222.5 lb

## 2018-07-27 DIAGNOSIS — E039 Hypothyroidism, unspecified: Secondary | ICD-10-CM | POA: Insufficient documentation

## 2018-07-27 DIAGNOSIS — R202 Paresthesia of skin: Secondary | ICD-10-CM

## 2018-07-27 DIAGNOSIS — I10 Essential (primary) hypertension: Secondary | ICD-10-CM | POA: Insufficient documentation

## 2018-07-27 DIAGNOSIS — E782 Mixed hyperlipidemia: Secondary | ICD-10-CM

## 2018-07-27 DIAGNOSIS — W57XXXA Bitten or stung by nonvenomous insect and other nonvenomous arthropods, initial encounter: Secondary | ICD-10-CM

## 2018-07-27 DIAGNOSIS — E119 Type 2 diabetes mellitus without complications: Secondary | ICD-10-CM

## 2018-07-27 DIAGNOSIS — M5416 Radiculopathy, lumbar region: Secondary | ICD-10-CM

## 2018-07-27 DIAGNOSIS — I83813 Varicose veins of bilateral lower extremities with pain: Secondary | ICD-10-CM

## 2018-07-27 LAB — COMPREHENSIVE METABOLIC PNL, FASTING
ALBUMIN: 4.3 g/dL (ref 3.4–4.8)
ALKALINE PHOSPHATASE: 60 U/L (ref 55–145)
ALT (SGPT): 16 U/L (ref <55)
ANION GAP: 10 mmol/L (ref 4–13)
AST (SGOT): 19 U/L (ref 8–41)
BILIRUBIN TOTAL: 0.5 mg/dL (ref 0.3–1.3)
BUN/CREA RATIO: 25 — ABNORMAL HIGH (ref 6–22)
BUN: 21 mg/dL (ref 8–25)
CALCIUM: 10.4 mg/dL — ABNORMAL HIGH (ref 8.5–10.2)
CHLORIDE: 106 mmol/L (ref 96–111)
CO2 TOTAL: 23 mmol/L (ref 22–32)
CREATININE: 0.85 mg/dL (ref 0.49–1.10)
ESTIMATED GFR: >60 mL/min/1.73mˆ2 (ref >60)
GLUCOSE: 134 mg/dL — ABNORMAL HIGH (ref 70–105)
POTASSIUM: 4.5 mmol/L (ref 3.5–5.1)
PROTEIN TOTAL: 7.5 g/dL (ref 6.0–8.0)
SODIUM: 139 mmol/L (ref 136–145)

## 2018-07-27 LAB — CBC WITH DIFF
BASOPHIL #: <0.1 x10ˆ3/uL (ref <=0.20)
BASOPHIL %: 1 %
EOSINOPHIL #: <0.1 10*3/uL (ref <=0.50)
EOSINOPHIL %: 1 %
HCT: 41.6 % (ref 34.8–46.0)
HGB: 13.4 g/dL (ref 11.5–16.0)
IMMATURE GRANULOCYTE #: <0.1 10*3/uL (ref <0.10)
IMMATURE GRANULOCYTE #: <0.1 10*3/uL (ref <0.10)
IMMATURE GRANULOCYTE %: 0 % (ref 0–1)
LYMPHOCYTE #: 1.11 10*3/uL (ref 1.00–4.80)
LYMPHOCYTE %: 27 %
MCH: 30.7 pg (ref 26.0–32.0)
MCHC: 32.2 g/dL (ref 31.0–35.5)
MCV: 95.4 fL (ref 78.0–100.0)
MCV: 95.4 fL (ref 78.0–100.0)
MONOCYTE #: 0.35 x10ˆ3/uL (ref 0.20–1.10)
MONOCYTE %: 8 %
MPV: 9.9 fL (ref 8.7–12.5)
NEUTROPHIL #: 2.67 10*3/uL (ref 1.50–7.70)
NEUTROPHIL %: 63 %
PLATELETS: 217 10*3/uL (ref 150–400)
RBC: 4.36 x10ˆ6/uL (ref 3.85–5.22)
RDW-CV: 13.9 % (ref 11.5–15.5)
WBC: 4.2 x10ˆ3/uL (ref 3.7–11.0)

## 2018-07-27 LAB — LIPID PANEL
CHOL/HDL RATIO: 4.7
CHOLESTEROL: 217 mg/dL — ABNORMAL HIGH (ref <200)
HDL CHOL: 46 mg/dL — ABNORMAL LOW (ref >=49)
LDL CALC: 127 mg/dL — ABNORMAL HIGH (ref <=100)
NON-HDL: 171 mg/dL (ref <=190)
TRIGLYCERIDES: 221 mg/dL — ABNORMAL HIGH (ref <=150)
VLDL CALC: 44 mg/dL — ABNORMAL HIGH (ref <30)

## 2018-07-27 LAB — HEPATIC FUNCTION PANEL
ALBUMIN: 4.3 g/dL (ref 3.4–4.8)
ALKALINE PHOSPHATASE: 60 U/L (ref 55–145)
ALT (SGPT): 16 U/L (ref <55)
AST (SGOT): 19 U/L (ref 8–41)
BILIRUBIN DIRECT: 0.2 mg/dL (ref <0.3)
BILIRUBIN TOTAL: 0.5 mg/dL (ref 0.3–1.3)
PROTEIN TOTAL: 7.5 g/dL (ref 6.0–8.0)

## 2018-07-27 LAB — IRON TRANSFERRIN AND TIBC
IRON (TRANSFERRIN) SATURATION: 16 % — ABNORMAL LOW (ref 20–50)
IRON: 76 ug/dL (ref 45–170)
TOTAL IRON BINDING CAPACITY: 463 ug/dL — ABNORMAL HIGH (ref 210–330)
TRANSFERRIN: 331 mg/dL (ref 160–340)

## 2018-07-27 LAB — VITAMIN B12: VITAMIN B 12: 469 pg/mL (ref 200–1000)

## 2018-07-27 LAB — HGA1C (HEMOGLOBIN A1C WITH EST AVG GLUCOSE)
ESTIMATED AVERAGE GLUCOSE: 128 mg/dL
HEMOGLOBIN A1C: 6.1 % — ABNORMAL HIGH (ref 4.0–5.6)

## 2018-07-27 LAB — FOLATE: FOLATE: 19.7 ng/mL (ref >=7.0)

## 2018-07-27 LAB — THYROID STIMULATING HORMONE WITH FREE T4 REFLEX: TSH: 0.794 u[IU]/mL (ref 0.350–5.000)

## 2018-07-27 LAB — FERRITIN: FERRITIN: 54 ng/mL (ref 10–200)

## 2018-07-27 MED ORDER — JANUMET 50 MG-1,000 MG TABLET
1.0000 | ORAL_TABLET | Freq: Every day | ORAL | 3 refills | Status: DC
Start: 2018-07-27 — End: 2018-08-05

## 2018-07-27 NOTE — Progress Notes (Signed)
OUTPATIENT PROGRESS NOTE    Subjective:   Patient ID:  Linda [name] is a pleasant 73 y.o. female.    Chief Complaint: Pain In Joint and Swelling      History of Present Illness:  HTN: HAs No    Dizziness No   Numbness/tingling lower legs and feet   Feeling like BP is too high or low No   Compliant with taking meds Yes   Home BP:  not doing              Any chest pain or shortness of breath No              Any side effects to medication No  HLD: tolerating cholosterol lowering medication     "unhealthy" diet in general   Is pt fasting today? Yes  Hypothyroidism: Hair changes No     Skin changes No     Diarrhea or constipation  No     Heat or cold intolerance  No     Palpitations No     Compliant with taking Synthroid Yes    Patient complains of chronic lumbar spine pain.  Patient does have a history of surgery to her lumbar spine and states that she has been having pain that radiates from her back down her posterior buttocks down her leg into her feet.  Patient states that she does have paresthesia to her feet but she does have a lot of tenderness to her lower extremities.  Patient states that she has been sitting around a lot over the past 2 months and not exercising and she has not been eating well.  Patient states that now she has been trying to improve her diet over the past week as well as increased activity and she has noticed increased swelling and tenderness to lower legs due to it more activity.  Patient says she has been having increased tiredness and fatigue and shortness of breath with minimal exertion.  Patient denies any chest pain. Patient states that she did find a tick on her this past Fall and states that she did not have Lyme testing but was treated with doxycycline for 14 days.   The history is provided by the patient.      Allergies:   No Known Allergies      Medications:   Blood Sugar Diagnostic (CONTOUR NEXT TEST STRIPS) Strip, check BS twice a day  glimepiride (AMARYL) 2 mg Oral Tablet, take 1  tablet by mouth once daily  Ibuprofen (MOTRIN) 800 mg Oral Tablet, take 1 tablet by mouth three times a day  levothyroxine (SYNTHROID) 125 mcg Oral Tablet, take 1 tablet by mouth once daily  losartan (COZAAR) 50 mg Oral Tablet, take 1 tablet by mouth once daily  pravastatin (PRAVACHOL) 80 mg Oral Tablet, take 1 tablet by mouth once daily  JANUMET 50-1,000 mg Oral Tablet, take 1 tablet by mouth twice a day with food    No facility-administered medications prior to visit.         Immunization History:     Immunization History   Administered Date(s) Administered   . Influenza Vaccine IM (ADMIN) 12/19/2016   . Prevnar 13 (Admin) 12/19/2016         Past Medical History:     Past Medical History:   Diagnosis Date   . Chronic pain of right ankle 12/19/2016   . Essential hypertension, benign 12/17/2016   . Hypothyroidism, adult 12/17/2016   . Mixed hyperlipidemia 12/17/2016   .  Obstructive sleep apnea 12/17/2016   . Type 2 diabetes mellitus without complication, without long-term current use of insulin (CMS HCC) 12/17/2016             Past Surgical History:     Past Surgical History:   Procedure Laterality Date   . HX APPENDECTOMY     . HX BACK SURGERY     . REPLACEMENT TOTAL KNEE     . SHOULDER SURGERY     . VEIN LIGATION               Family History:     Family Medical History:     Problem Relation (Age of Onset)    Leukemia Mother    Lymphoma Father                Social History:   Linda [name]  reports that she has never smoked. She has never used smokeless tobacco. She reports that she does not drink alcohol.          Review of Systems: Other than ROS in HPI, all other systems are negative.      Objective:   Vitals:    Vitals:    07/27/18 1040   BP: (!) 136/92   Pulse: 68   Resp: 14   Temp: 36.3 C (97.3 F)   TempSrc: Thermal Scan   SpO2: 97%   Weight: 101 kg (222 lb 8 oz)   Height: 1.689 m (5' 6.5")   BMI: 35.45          Body mass index is 35.37 kg/m.      Constitutional: Alert, well developed, well nourished  HEENT:   Head: NC/AT    Eyes: Sclera anicteric, conjunctiva not injected    Ears: EAC normal, bilateral TMs clear    Nose: No discharge    Throat: MMM, posterior pharynx without erythema or exudate  Neck:   Supple with normal ROM, no cervical LAD, no thyromegaly, no JVD, no carotid bruits  Cardiovascular: RRR, normal S1/S2, no murmurs/rubs/gallops  Pulmonary:  CTAB, equal air entry, nonlabored, no wheezes/crackles/rhonchi  Abdomen:   NABS, NT/ND, soft, no HSM, no masses  Musculoskeletal:  No deformity, no injury, no edema  Neurological:   Alert, oriented x 3, no abnormal tone  Skin:     Warm, pink, dry, no rashes, no jaundice, no pallor, no cyanosis, varicose veins to bilateral lower legs and feet, trace edema to bilateral lower legs and feer.   Psychiatric:  Normal mood, affect, behavior, judgment, and thought content        Assessment & Plan:   Linda [name] was seen today for pain in joint and swelling.    Diagnoses and all orders for this visit:    Essential hypertension, benign  -     IRON; Future  -     FERRITIN; Future  -     IRON TRANSFERRIN AND TIBC; Future  -     CBC/DIFF; Future  -     COMPREHENSIVE METABOLIC PNL, FASTING; Future  -     LIPID PANEL; Future  -     HEPATIC FUNCTION PANEL; Future  -     THYROID STIMULATING HORMONE WITH FREE T4 REFLEX; Future  -     VITAMIN B12; Future  -     FOLATE; Future  -     VITAMIN D 25, TOTAL; Future  -     NCS/EMG; Future  -     Peripheral Venous Duplex- Lower; Future  -  Pulse Volume Recording (PVR) with ABI- Lower; Future  -     TRANSTHORACIC ECHOCARDIOGRAM - ADULT; Future  -     MRI Lumbosacral Spine WO; Future  -     Cancel: IRON  -     FERRITIN  -     IRON TRANSFERRIN AND TIBC  -     CBC/DIFF  -     COMPREHENSIVE METABOLIC PNL, FASTING  -     LIPID PANEL  -     HEPATIC FUNCTION PANEL  -     THYROID STIMULATING HORMONE WITH FREE T4 REFLEX  -     VITAMIN B12  -     FOLATE  -     VITAMIN D 25, TOTAL  -     CBC WITH DIFF    Mixed hyperlipidemia  -     IRON; Future  -      FERRITIN; Future  -     IRON TRANSFERRIN AND TIBC; Future  -     CBC/DIFF; Future  -     COMPREHENSIVE METABOLIC PNL, FASTING; Future  -     LIPID PANEL; Future  -     HEPATIC FUNCTION PANEL; Future  -     THYROID STIMULATING HORMONE WITH FREE T4 REFLEX; Future  -     VITAMIN B12; Future  -     FOLATE; Future  -     VITAMIN D 25, TOTAL; Future  -     NCS/EMG; Future  -     Peripheral Venous Duplex- Lower; Future  -     Pulse Volume Recording (PVR) with ABI- Lower; Future  -     TRANSTHORACIC ECHOCARDIOGRAM - ADULT; Future  -     MRI Lumbosacral Spine WO; Future  -     Cancel: IRON  -     FERRITIN  -     IRON TRANSFERRIN AND TIBC  -     CBC/DIFF  -     COMPREHENSIVE METABOLIC PNL, FASTING  -     LIPID PANEL  -     HEPATIC FUNCTION PANEL  -     THYROID STIMULATING HORMONE WITH FREE T4 REFLEX  -     VITAMIN B12  -     FOLATE  -     VITAMIN D 25, TOTAL  -     CBC WITH DIFF    Type 2 diabetes mellitus without complication, without long-term current use of insulin (CMS HCC)  -     IRON; Future  -     FERRITIN; Future  -     IRON TRANSFERRIN AND TIBC; Future  -     CBC/DIFF; Future  -     COMPREHENSIVE METABOLIC PNL, FASTING; Future  -     LIPID PANEL; Future  -     HEPATIC FUNCTION PANEL; Future  -     THYROID STIMULATING HORMONE WITH FREE T4 REFLEX; Future  -     VITAMIN B12; Future  -     FOLATE; Future  -     VITAMIN D 25, TOTAL; Future  -     Hemoglobin A1C; Future  -     NCS/EMG; Future  -     Peripheral Venous Duplex- Lower; Future  -     Pulse Volume Recording (PVR) with ABI- Lower; Future  -     TRANSTHORACIC ECHOCARDIOGRAM - ADULT; Future  -     MRI Lumbosacral Spine WO; Future  -  Cancel: IRON  -     FERRITIN  -     IRON TRANSFERRIN AND TIBC  -     CBC/DIFF  -     COMPREHENSIVE METABOLIC PNL, FASTING  -     LIPID PANEL  -     HEPATIC FUNCTION PANEL  -     THYROID STIMULATING HORMONE WITH FREE T4 REFLEX  -     VITAMIN B12  -     FOLATE  -     VITAMIN D 25, TOTAL  -     Hemoglobin A1C  -     CBC WITH  DIFF    Hypothyroidism, adult  -     IRON; Future  -     FERRITIN; Future  -     IRON TRANSFERRIN AND TIBC; Future  -     CBC/DIFF; Future  -     COMPREHENSIVE METABOLIC PNL, FASTING; Future  -     LIPID PANEL; Future  -     HEPATIC FUNCTION PANEL; Future  -     THYROID STIMULATING HORMONE WITH FREE T4 REFLEX; Future  -     VITAMIN B12; Future  -     FOLATE; Future  -     VITAMIN D 25, TOTAL; Future  -     NCS/EMG; Future  -     Peripheral Venous Duplex- Lower; Future  -     Pulse Volume Recording (PVR) with ABI- Lower; Future  -     TRANSTHORACIC ECHOCARDIOGRAM - ADULT; Future  -     MRI Lumbosacral Spine WO; Future  -     Cancel: IRON  -     FERRITIN  -     IRON TRANSFERRIN AND TIBC  -     CBC/DIFF  -     COMPREHENSIVE METABOLIC PNL, FASTING  -     LIPID PANEL  -     HEPATIC FUNCTION PANEL  -     THYROID STIMULATING HORMONE WITH FREE T4 REFLEX  -     VITAMIN B12  -     FOLATE  -     VITAMIN D 25, TOTAL  -     CBC WITH DIFF    Varicose veins of both lower extremities with pain  -     NCS/EMG; Future  -     Peripheral Venous Duplex- Lower; Future  -     Pulse Volume Recording (PVR) with ABI- Lower; Future  -     TRANSTHORACIC ECHOCARDIOGRAM - ADULT; Future  -     MRI Lumbosacral Spine WO; Future    Bilateral leg paresthesia  -     NCS/EMG; Future  -     Peripheral Venous Duplex- Lower; Future  -     Pulse Volume Recording (PVR) with ABI- Lower; Future  -     TRANSTHORACIC ECHOCARDIOGRAM - ADULT; Future  -     MRI Lumbosacral Spine WO; Future    Lumbar radiculopathy  -     NCS/EMG; Future  -     Peripheral Venous Duplex- Lower; Future  -     Pulse Volume Recording (PVR) with ABI- Lower; Future  -     TRANSTHORACIC ECHOCARDIOGRAM - ADULT; Future  -     MRI Lumbosacral Spine WO; Future    Tick bite, initial encounter  -     LYME ANTIBODY PANEL WITH REFLEX; Future    Other orders  -     SITagliptin-metformin (  JANUMET) 50-1,000 mg Oral Tablet; Take 1 Tab by mouth Once a day     Discussed with patient to increase activity  as tolerated and to check blood pressure morning and evening over the next week or if symptomatic with increased fatigue.  Discussed with patient will call her with results of testing and if symptoms worsen to go to the emergency room.              Health Maintenance   Topic Date Due   . Adult Tdap-Td (1 - Tdap) 04/20/1965   . Hepatitis C screening antibody  04/21/1991   . Mammography  04/20/1996   . Shingles Vaccine (1 of 2) 04/20/1996   . Osteoporosis screening  04/21/2011   . Pneumococcal 65+ Years Low Risk (2 of 2 - PPSV23) 12/19/2017   . Influenza Vaccine (Season Ended) 11/09/2018   . Depression Screening  11/25/2018   . Annual Wellness Exam  11/25/2018   . Colonoscopy  07/02/2025       Return in about 1 week (around 08/03/2018), or if symptoms worsen or fail to improve.    The patient has been educated and verbalized understanding regarding the services provided during this visit.      Grace Isaac, APRN,NP-C  Patient was seen independently in clinic. The cosigning physician was available but did not participate in an examination or management of this patient unless otherwise noted.   Algis Downs [employer] DO

## 2018-07-27 NOTE — Nursing Note (Signed)
Department of Community Practice     Venipuncture performed in office on right arm antecubital vein, dry pressure dressing was applied to site and patient tolerated it well.  Specimen was centrifuged, aliquoted as needed and specimen was labeled and packaged for transport.  Rudi Coco, CA  07/27/2018, 11:27

## 2018-07-28 ENCOUNTER — Other Ambulatory Visit (INDEPENDENT_AMBULATORY_CARE_PROVIDER_SITE_OTHER): Payer: Self-pay | Admitting: Family Medicine

## 2018-07-28 LAB — VITAMIN D 25, TOTAL: VITAMIN D, 25OH: 29 ng/mL — ABNORMAL LOW (ref 30–100)

## 2018-07-28 MED ORDER — CHOLECALCIFEROL (VITAMIN D3) 1,250 MCG (50,000 UNIT) CAPSULE
50000.00 [IU] | ORAL_CAPSULE | ORAL | 0 refills | Status: DC
Start: 2018-07-28 — End: 2018-08-30

## 2018-07-28 NOTE — Telephone Encounter (Signed)
-----   Message from [place], [place] sent at 07/28/2018  1:14 PM EDT -----  vit d 50,000 q week x 8 weeks

## 2018-07-28 NOTE — Telephone Encounter (Signed)
Left message. Pended med.      Starleen Arms, Ambulatory Care Assistant  07/28/2018, 14:30

## 2018-07-29 ENCOUNTER — Other Ambulatory Visit (INDEPENDENT_AMBULATORY_CARE_PROVIDER_SITE_OTHER): Payer: Self-pay | Admitting: Family Medicine

## 2018-07-29 MED ORDER — CHOLECALCIFEROL (VITAMIN D3) 1,250 MCG (50,000 UNIT) CAPSULE
50000.00 [IU] | ORAL_CAPSULE | ORAL | 0 refills | Status: AC
Start: 2018-07-29 — End: ?

## 2018-07-29 NOTE — Telephone Encounter (Signed)
Pended med. Spoke to pt.      Starleen Arms, Ambulatory Care Assistant  07/29/2018, 08:26

## 2018-07-29 NOTE — Telephone Encounter (Signed)
Regarding: return call   ----- Message from Lynnell Grain, [facility] sent at 07/28/2018  3:17 PM EDT -----  Jacob Moores., DO    Linda [name] - pt called back to speak with you, Please call.

## 2018-07-30 LAB — LYME ANTIBODY PANEL WITH REFLEX
LYME IGG: NEGATIVE
LYME IGM: NEGATIVE

## 2018-08-02 ENCOUNTER — Other Ambulatory Visit (INDEPENDENT_AMBULATORY_CARE_PROVIDER_SITE_OTHER): Payer: Self-pay | Admitting: Family Medicine

## 2018-08-03 ENCOUNTER — Ambulatory Visit (INDEPENDENT_AMBULATORY_CARE_PROVIDER_SITE_OTHER): Payer: Self-pay | Admitting: Family Medicine

## 2018-08-03 NOTE — Telephone Encounter (Signed)
Pt requesting a different script can not afford meds. Thank You Sorry Doc,

## 2018-08-03 NOTE — Telephone Encounter (Signed)
Regarding: med issue   ----- Message from Lynnell Grain, [facility] sent at 08/03/2018 10:15 AM EDT -----  Jacob Moores., DO    Pt called stating that the Thera Flake is to expensive, and the insurance state that there is other medications.  Pt would like for a nurse to call her.  Please call pt to discuss medication. Thank you    SITagliptin-metformin (JANUMET) 50-1,000 mg Oral Tablet 90 Tab 3 07/27/2018    Sig - Route: Take 1 Tab by mouth Once a day - Oral   Sent to pharmacy as: Janumet 50 mg-1,000 mg tablet (SITagliptin-metformin)   Class: E-Rx   Non-formulary Exception Code: RXHUB/No Formulary Info Available   E-Prescribing Status: Receipt confirmed by pharmacy (07/27/2018 11:16 AM EDT)     Preferred Pharmacy     [facility] - [place], [place] - [address]    [address] [place] [address]    Phone: [phone] Fax: [phone]    Not a 24 hour pharmacy; exact hours not known.

## 2018-08-04 ENCOUNTER — Ambulatory Visit (INDEPENDENT_AMBULATORY_CARE_PROVIDER_SITE_OTHER): Payer: Self-pay | Admitting: Family Medicine

## 2018-08-04 NOTE — Telephone Encounter (Signed)
Regarding: med issue / labs   ----- Message from Lynnell Grain, [facility] sent at 08/04/2018  2:36 PM EDT -----  Jacob Moores., DO    Pt called stating that she would like to get her lab results, and a new diabetica medication called in, pt has been out for a few days.  Pt is requesting a call to go over the list of medication she can use.      Please call

## 2018-08-04 NOTE — Telephone Encounter (Signed)
Pt states her insurance will only cover Actosplus-met instead of JANUMET. And if prescribed do you want her to continue Glimepiride. Please advise, thank you.      Veatrice Bourbon, Ambulatory Care Assistant  08/04/2018, 15:40

## 2018-08-05 MED ORDER — METFORMIN 1,000 MG TABLET
1000.00 mg | ORAL_TABLET | Freq: Two times a day (BID) | ORAL | 4 refills | Status: DC
Start: 2018-08-05 — End: 2018-08-30

## 2018-08-05 NOTE — Telephone Encounter (Signed)
I do not rec the actos plus met due to potential side effects and I do rec just changing from the janumet to the metformin and increase to bid and yes continue the Amaryl. I sent in new rx for metformin and DC the janumet

## 2018-08-05 NOTE — Telephone Encounter (Signed)
Pt notified.    Starleen Arms, Ambulatory Care Assistant  08/05/2018, 10:26

## 2018-08-10 ENCOUNTER — Ambulatory Visit (INDEPENDENT_AMBULATORY_CARE_PROVIDER_SITE_OTHER): Payer: Self-pay | Admitting: Family Medicine

## 2018-08-10 NOTE — Telephone Encounter (Signed)
Regarding: fax 2 orders  ----- Message from Lelon Frohlich Egidi sent at 08/10/2018 10:03 AM EDT -----  Jacob Moores., DO//T. Catarina Hartshorn, [facility] is requesting the order for pt's echo and mri be faxed over to [phone].  Pt has an appt for both of these tomorrow, 06/03

## 2018-08-10 NOTE — Telephone Encounter (Signed)
Faxed orders.  Earnest Rosier, [place]  08/10/2018, 11:01

## 2018-08-12 ENCOUNTER — Ambulatory Visit (INDEPENDENT_AMBULATORY_CARE_PROVIDER_SITE_OTHER): Payer: Self-pay | Admitting: Family Medicine

## 2018-08-12 ENCOUNTER — Other Ambulatory Visit (INDEPENDENT_AMBULATORY_CARE_PROVIDER_SITE_OTHER): Payer: Self-pay | Admitting: Family Medicine

## 2018-08-12 NOTE — Telephone Encounter (Signed)
Pt having side effects with metformin and Amaryl. Terrible stomach pains, diarrhea, nausea. Pt stopped medication yesterday and is feeling better. Please advise, thank you.        Starleen Arms, Ambulatory Care Assistant  08/12/2018, 15:52

## 2018-08-12 NOTE — Telephone Encounter (Signed)
Regarding: Missed Call   ----- Message from Gerhard Munch sent at 08/12/2018  3:50 PM EDT -----  Pt was returning your call again.  Please call to advise.     ----- Message from [facility] sent at 08/12/2018  1:35 PM EDT -----  Jacob Moores., DO    Patient is returning a missed call from [facility]

## 2018-08-12 NOTE — Telephone Encounter (Signed)
Regarding: med side affects   ----- Message from Lynnell Grain, [facility] sent at 08/11/2018  9:22 AM EDT -----  Jacob Moores., DO    Pt called stating that she is having a lot of side affects, with her new diabetic medication. Pt really would like to speak with a nurse.  Please call. Thank you.    MetFORMIN (GLUCOPHAGE) 1,000 mg Oral Tablet  glimepiride (AMARYL) 2 mg Oral Tablet

## 2018-08-12 NOTE — Telephone Encounter (Signed)
Left message.       Starleen Arms, Ambulatory Care Assistant  08/12/2018, 11:51

## 2018-08-12 NOTE — Telephone Encounter (Signed)
Regarding: Missed Call   ----- Message from [facility] sent at 08/12/2018  1:35 PM EDT -----  Jacob Moores., DO    Patient is returning a missed call from [facility]

## 2018-08-12 NOTE — Telephone Encounter (Signed)
it would be the metformin so resume the Amaryl as rx

## 2018-08-12 NOTE — Telephone Encounter (Signed)
Left message.      Starleen Arms, Ambulatory Care Assistant  08/12/2018, 14:48

## 2018-08-13 ENCOUNTER — Other Ambulatory Visit (INDEPENDENT_AMBULATORY_CARE_PROVIDER_SITE_OTHER): Payer: Self-pay | Admitting: Family Medicine

## 2018-08-13 NOTE — Telephone Encounter (Signed)
Pt notified.      Starleen Arms, Ambulatory Care Assistant  08/13/2018, 11:19

## 2018-08-23 ENCOUNTER — Ambulatory Visit (INDEPENDENT_AMBULATORY_CARE_PROVIDER_SITE_OTHER): Payer: Self-pay | Admitting: Family

## 2018-08-23 NOTE — Telephone Encounter (Signed)
Echo report normal.  Patient voiced understanding.  Has questions about he medicine and sugar. I explained she should come in and discuss this with Dr.Means.  Lowella Dell Tameyah Koch, MA  08/23/2018, 11:53

## 2018-08-27 ENCOUNTER — Telehealth (INDEPENDENT_AMBULATORY_CARE_PROVIDER_SITE_OTHER): Payer: Self-pay | Admitting: Family Medicine

## 2018-08-27 NOTE — Telephone Encounter (Signed)
Called patient prior to appointment to prescreen for COVID-19.      Have you had new or worsened shortness of breath in the past 14 days?  No  Have you had a new or worsening cough in the past 14 days?  No  Have you had a fever in the past 14 days?  No  Have you experienced a loss of taste or smell in the past 14 days? No  Have you or someone you have been in close contact with, been test for COVID-19 or tested positive for COVID-19? No    Patient or patients guardian/attendant has a Negative Prescreen.  Instructed patient to proceed with appointment as planned.    Patient informed of visitor policy at this time.    Informed patient that we are requesting all patients wear a patient supplied mask when entering the clinic.     Appointment notes have been updated to reflect screening.    Patient instructed to present to the clinic for scheduled appointment    Linda [name]  08/27/2018, 11:01

## 2018-08-29 NOTE — Progress Notes (Signed)
OUTPATIENT PROGRESS NOTE    Subjective:   Patient ID:  Linda [name] is a pleasant 73 y.o. female.    Chief Complaint: Diabetes      History of Present Illness:  DM: Fasting FS range 120   Nonfasting FS range 170   Hypoglycemia episodes  No   Compliant with diabetic diet Yes   Sores on feet No  Diabetes Monitors  A1C: 6.1  A1C Date: 07/27/2018           Nephropathy Screening: On ACEI or ARB    Last Lipid Panel  (Last result in the past 2 years)      Cholesterol   HDL   LDL   Direct LDL   Triglycerides      07/27/18 1119 217 46 127   221        Retinal Exam Date: Not Found  Last diabetic foot exam: Not Found  HTN: HAs No    Dizziness No   Feeling like BP is too high or low No   Compliant with taking meds Yes   Home BP:  not doing  HLD: tolerating cholosterol lowering medication     "healthy" diet  in general   Is pt fasting today? No   Hypothyroidism: Hair changes No     Skin changes No     Diarrhea or constipation  No     Heat or cold intolerance  No     Palpitations No     Compliant with taking Synthroid Yes   She states that she did stop the metformin as she was having lower abdominal cramping and some diarrhea and she states that she stopped the medications over a month ago but her symptoms have persisted. She has been under a lot of stress and she states that this has been due to a wedding that has been drastically changed due to the COVID situation. She states that when she is stressed this is worse. She has not had any low BS or hypoglycemia.  The history is provided by the patient.      Allergies:   No Known Allergies      Medications:   Blood Sugar Diagnostic (CONTOUR NEXT TEST STRIPS) Strip, check BS twice a day  cholecalciferol, vitamin D3, 1,250 mcg (50,000 unit) Oral Capsule, Take 1 Cap (50,000 Units total) by mouth Every 7 days  Ibuprofen (MOTRIN) 800 mg Oral Tablet, take 1 tablet by mouth three times a day  levothyroxine (SYNTHROID) 125 mcg Oral Tablet, take 1 tablet by mouth once daily  losartan (COZAAR)  50 mg Oral Tablet, take 1 tablet by mouth once daily  pravastatin (PRAVACHOL) 80 mg Oral Tablet, take 1 tablet by mouth once daily  cholecalciferol, vitamin D3, 1,250 mcg (50,000 unit) Oral Capsule, Take 1 Cap (50,000 Units total) by mouth Every 7 days  glimepiride (AMARYL) 2 mg Oral Tablet, take 1 tablet by mouth once daily  MetFORMIN (GLUCOPHAGE) 1,000 mg Oral Tablet, Take 1 Tab (1,000 mg total) by mouth Twice daily with food    No facility-administered medications prior to visit.         Immunization History:     Immunization History   Administered Date(s) Administered   . Influenza Vaccine IM (ADMIN) 12/19/2016   . Prevnar 13 (Admin) 12/19/2016         Past Medical History:     Past Medical History:   Diagnosis Date   . Chronic pain of right ankle 12/19/2016   . Essential hypertension,  benign 12/17/2016   . Hypothyroidism, adult 12/17/2016   . Mixed hyperlipidemia 12/17/2016   . Obstructive sleep apnea 12/17/2016   . Type 2 diabetes mellitus without complication, without long-term current use of insulin (CMS HCC) 12/17/2016             Past Surgical History:     Past Surgical History:   Procedure Laterality Date   . HX APPENDECTOMY     . HX BACK SURGERY     . REPLACEMENT TOTAL KNEE     . SHOULDER SURGERY     . VEIN LIGATION               Family History:     Family Medical History:     Problem Relation (Age of Onset)    Leukemia Mother    Lymphoma Father                Social History:   Rhys MartiniCarol Delgreco  reports that she has never smoked. She has never used smokeless tobacco. She reports that she does not drink alcohol.          ROS:  Constitutional: Denies fevers, chills, night sweats. No recent weight changes or fatigue.   Eyes: Denies change in vision. No irritation, erythema, discharge or pain.   Ears: Denies any difficulty with hearing. No ear pain, tinnitus or discharge.  Mouth/Throat: Denies any oral ulcers or other lesions. No sore throat, hoarseness or dysphagia.  Cardiovascular: Denies any chest pain,  palpitations, PND or DOE  Respiratory: Denies any shortness of breath, wheezing, cough   GI: Does have the lower abd cramping and diarrhea  GU: Denies any dysuria, frequency, hematuria, hesitancy. Denies nocturia or incontinence.  Musculoskeletal: Does have the low back pain and leg pain.  Neurological: No history of syncope or seizures. Denies any dizziness or headaches.  Emotional/Psychiatric: Denies any memory issues. No history of depression. does have some anxiety.  Skin: Denies any recent rashes or lesions.         Objective:   Vitals:    Vitals:    08/30/18 0804   BP: 128/80   Pulse: 76   Resp: 18   Temp: 36.4 C (97.5 F)   SpO2: 97%   Weight: 99.8 kg (220 lb)   Height: 1.689 m (5' 6.5")   BMI: 35.05          Body mass index is 34.98 kg/m.      Constitutional: Alert, well developed, well nourished  HEENT:  Head: NC/AT    Eyes: Sclera anicteric, conjunctiva not injected    Ears: EAC normal, bilateral TMs clear    Nose: No discharge    Throat: MMM, posterior pharynx without erythema or exudate  Neck:   Supple with normal ROM, no cervical LAD, no thyromegaly, no JVD, no carotid bruits  Cardiovascular: RRR, normal S1/S2, no murmurs/rubs/gallops  Pulmonary:  CTAB, equal air entry, nonlabored, no wheezes/crackles/rhonchi  Abdomen:   NABS, NT/ND, soft, no HSM, no masses  Musculoskeletal:  tender lumbar spine and with ropiness and spasm  Neurological:   Alert, oriented x 3, no abnormal tone  Skin:     Warm, pink, dry, no rashes, no jaundice, no pallor, no cyanosis  Psychiatric:  Normal mood, affect, behavior, judgment, and thought content        Assessment & Plan:       ICD-10-CM    1. Type 2 diabetes mellitus without complication, without long-term current use of insulin (CMS HCC) E11.9 Hemoglobin  A1C    with recent change in medications due to cost   2. Mixed hyperlipidemia E78.2 Lipid Panel   3. Essential hypertension, benign I10 CBC     Comp Metabolic Panel- Fasting   4. Obstructive sleep apnea G47.33    5.  Hypothyroidism, adult E03.9 TSH Sensitive   6. Screening for breast cancer Z12.39 Mammogram: Screening Bilateral   7. Chronic pain of right ankle M25.571     G89.29    8. Lumbar radiculopathy M54.16 Refer to External Provider     Refer to Physical Therapy-External    with increased right leg sx and with foraminal narrowing at l5-s1 and with now bulging disc at l3-4       Orders Placed This Encounter   . Mammogram: Screening Bilateral   . CBC   . Comp Metabolic Panel- Fasting   . Lipid Panel   . Hemoglobin A1C   . TSH Sensitive   . Refer to External Provider   . Refer to Physical Therapy-External   . dicyclomine (BENTYL) 10 mg Oral Capsule   . SITagliptin-metformin (JANUMET) 50-1,000 mg Oral Tablet                   Health Maintenance   Topic Date Due   . Adult Tdap-Td (1 - Tdap) 04/20/1965   . Hepatitis C screening  04/21/1991   . Mammography  04/20/1996   . Shingles Vaccine (1 of 2) 04/20/1996   . Osteoporosis screening  04/21/2011   . Pneumococcal 65+ Years Low Risk (2 of 2 - PPSV23) 12/19/2017   . Influenza Vaccine (Season Ended) 11/09/2018   . Depression Screening  11/25/2018   . Annual Wellness Exam  11/25/2018   . Colonoscopy  07/02/2025   We did review patient diabetes status and need for continued diet, exercise and regular follow up with labs and A1c. I did review her last labs and these were excellent but now her medications have changed and this was due to cost but she now wants to resume her prior medications due to her lack of control now. I did rec that she continue her other medications. 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 I did rec that we try the bentyl as above and did rec stress reduction. If her symptoms do worsen then I did rec GI w/u. I did rec repeat labs in 2 months.  We did rec yearly ophthalmology exams and also regular foot exam and did review foot care. Patient will continue to monitor blood glucose and call with any issues or  concerns. 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 I would rec physical therapy for her lumbar spine and also referral to pain management for probable injections.     Return in about 3 months (around 11/30/2018).    Junious Dresser [employer] DO

## 2018-08-30 ENCOUNTER — Encounter (INDEPENDENT_AMBULATORY_CARE_PROVIDER_SITE_OTHER): Payer: Self-pay | Admitting: Family Medicine

## 2018-08-30 ENCOUNTER — Ambulatory Visit (INDEPENDENT_AMBULATORY_CARE_PROVIDER_SITE_OTHER): Payer: Medicare Other | Admitting: Family Medicine

## 2018-08-30 ENCOUNTER — Other Ambulatory Visit: Payer: Self-pay

## 2018-08-30 VITALS — BP 128/80 | HR 76 | Temp 97.5°F | Resp 18 | Ht 66.5 in | Wt 220.0 lb

## 2018-08-30 DIAGNOSIS — G8929 Other chronic pain: Secondary | ICD-10-CM

## 2018-08-30 DIAGNOSIS — I10 Essential (primary) hypertension: Secondary | ICD-10-CM

## 2018-08-30 DIAGNOSIS — G4733 Obstructive sleep apnea (adult) (pediatric): Secondary | ICD-10-CM

## 2018-08-30 DIAGNOSIS — E119 Type 2 diabetes mellitus without complications: Principal | ICD-10-CM

## 2018-08-30 DIAGNOSIS — M25571 Pain in right ankle and joints of right foot: Secondary | ICD-10-CM

## 2018-08-30 DIAGNOSIS — E039 Hypothyroidism, unspecified: Secondary | ICD-10-CM

## 2018-08-30 DIAGNOSIS — M5416 Radiculopathy, lumbar region: Secondary | ICD-10-CM

## 2018-08-30 DIAGNOSIS — E782 Mixed hyperlipidemia: Secondary | ICD-10-CM

## 2018-08-30 DIAGNOSIS — Z1239 Encounter for other screening for malignant neoplasm of breast: Secondary | ICD-10-CM

## 2018-08-30 MED ORDER — DICYCLOMINE 10 MG CAPSULE
10.00 mg | ORAL_CAPSULE | Freq: Two times a day (BID) | ORAL | 5 refills | Status: AC | PRN
Start: 2018-08-30 — End: ?

## 2018-08-30 MED ORDER — SITAGLIPTIN PHOSPHATE 50 MG-METFORMIN 1,000 MG TABLET
1.0000 | ORAL_TABLET | Freq: Two times a day (BID) | ORAL | 5 refills | Status: DC
Start: 2018-08-30 — End: 2019-09-22

## 2018-08-30 NOTE — Nursing Note (Signed)
08/30/18 0800   Depression Screen   Little interest or pleasure in doing things. 0   Feeling down, depressed, or hopeless 0   PHQ 2 Total 0   Consandra Laske, Ambulatory Care Assistant  08/30/2018, 08:02

## 2018-10-04 ENCOUNTER — Ambulatory Visit (INDEPENDENT_AMBULATORY_CARE_PROVIDER_SITE_OTHER): Payer: Self-pay | Admitting: Family Medicine

## 2018-10-04 DIAGNOSIS — M5416 Radiculopathy, lumbar region: Secondary | ICD-10-CM

## 2018-10-04 NOTE — Telephone Encounter (Signed)
Regarding: [place] rehab - script request   ----- Message from Sandria Senter, [facility] sent at 10/04/2018  1:46 PM EDT -----  Harrietta Guardian., DO    Stephane called from [facility] stating that the pt called to scheduled an apt for her lumbar physical therapy, but her script is out dated by June and there only good for 30days, and wanted to see if they can have another updated script faxed over so they can get her scheduled..     Please fax, thank you    Fax: [phone]

## 2018-10-04 NOTE — Telephone Encounter (Signed)
done. please  fax

## 2018-10-05 NOTE — Telephone Encounter (Signed)
Linda [name], Ambulatory Care Assistant  10/05/2018, 11:39

## 2018-10-07 ENCOUNTER — Ambulatory Visit (INDEPENDENT_AMBULATORY_CARE_PROVIDER_SITE_OTHER): Payer: Self-pay | Admitting: Family Medicine

## 2018-10-07 NOTE — Telephone Encounter (Signed)
Leonia Reader, Ambulatory Care Assistant  10/07/2018, 14:15

## 2018-10-07 NOTE — Telephone Encounter (Signed)
Faxed to [employer]  10/07/2018, 13:37

## 2018-10-07 NOTE — Telephone Encounter (Signed)
Regarding: Order Request   ----- Message from [facility] sent at 10/07/2018 11:26 AM EDT -----  Harrietta Guardian., DO    Colletta Maryland is requesting a copy of the patients lumbar physical therapy order with the update date on it. Colletta Maryland states she hs not received a copy of it yet.     [phone]

## 2018-11-11 ENCOUNTER — Ambulatory Visit (INDEPENDENT_AMBULATORY_CARE_PROVIDER_SITE_OTHER): Payer: Self-pay | Admitting: Family Medicine

## 2018-11-11 DIAGNOSIS — M5416 Radiculopathy, lumbar region: Secondary | ICD-10-CM

## 2018-11-11 NOTE — Telephone Encounter (Signed)
Can you please order and i will fax?      Veatrice Bourbon, Ambulatory Care Assistant  11/11/2018, 11:38

## 2018-11-11 NOTE — Telephone Encounter (Signed)
done , please fax

## 2018-11-11 NOTE — Telephone Encounter (Signed)
Regarding: Rehab orders requested   ----- Message from Luciana Axe sent at 11/11/2018 10:59 AM EDT -----  Harrietta Guardian., DO    [facility] rehab called stating they will need an updated script for the patients physical therapy for her lumbar ( lower back ) Please fax to advise .      Fax [phone]

## 2018-11-16 NOTE — Telephone Encounter (Signed)
Linda [name], Ambulatory Care Assistant  11/16/2018, 07:57

## 2018-11-21 ENCOUNTER — Other Ambulatory Visit (INDEPENDENT_AMBULATORY_CARE_PROVIDER_SITE_OTHER): Payer: Self-pay | Admitting: Family Medicine

## 2019-01-10 ENCOUNTER — Telehealth (INDEPENDENT_AMBULATORY_CARE_PROVIDER_SITE_OTHER): Payer: Self-pay | Admitting: Family Medicine

## 2019-01-10 NOTE — Progress Notes (Signed)
OUTPATIENT PROGRESS NOTE    Subjective:   Patient ID:  Linda [name] is a pleasant 73 y.o. female.    Chief Complaint: Diabetes Follow up (Here for 3 month check Labs Frick Janumet 50/1000 ); Hypertension (Maintained on meds to control losartan 100 po q d); Hypothyroidism (cont on levothyroxine 125 po q d ); and Hyperlipidemia (Maintained on pravastatin 80)      History of Present Illness:  HTN: HAs No    Dizziness No   Feeling like BP is too high or low No   Compliant with taking meds Yes   Home BP:  not doing  HLD: tolerating cholosterol lowering medication     "healthy" diet  in general   Is pt fasting today? No   DM: Fasting FS range 190   Nonfasting FS range 200   Hypoglycemia episodes  No   Compliant with diabetic diet Yes   Sores on feet No  Diabetes Monitors  A1C: 6.1  A1C Date: 07/27/2018           Nephropathy Screening: On ACEI or ARB    Last Lipid Panel  (Last result in the past 2 years)      Cholesterol   HDL   LDL   Direct LDL   Triglycerides      07/27/18 1119 217 46 127   221        Retinal Exam Date: Not Found  Last diabetic foot exam: Not Found  Hypothyroidism: Hair changes No     Skin changes No     Diarrhea or constipation  No     Heat or cold intolerance  No     Palpitations No     Compliant with taking Synthroid Yes  She states that her BS have been increased and sometimes are in the 200 range. She states that she had changed her medications in the past due to the cost. She does try to watch her diet and she does try to stay active. She feels that her thyroid has been fine and her energy levels have been ok.   The history is provided by the patient.      Allergies:   No Known Allergies      Medications:   Blood Sugar Diagnostic (CONTOUR NEXT TEST STRIPS) Strip, check BS twice a day  cholecalciferol, vitamin D3, 1,250 mcg (50,000 unit) Oral Capsule, Take 1 Cap (50,000 Units total) by mouth Every 7 days  dicyclomine (BENTYL) 10 mg Oral Capsule, Take 1 Cap (10 mg total) by mouth Twice per day as  needed  Ibuprofen (MOTRIN) 800 mg Oral Tablet, take 1 tablet by mouth three times a day  levothyroxine (SYNTHROID) 125 mcg Oral Tablet, take 1 tablet by mouth once daily  losartan (COZAAR) 50 mg Oral Tablet, take 1 tablet by mouth once daily  pravastatin (PRAVACHOL) 80 mg Oral Tablet, take 1 tablet by mouth once daily  SITagliptin-metformin (JANUMET) 50-1,000 mg Oral Tablet, Take 1 Tab by mouth Twice daily with food    No facility-administered medications prior to visit.         Immunization History:     Immunization History   Administered Date(s) Administered   . Influenza Vaccine IM-Quadrivalent (Admin) 12/19/2016   . Prevnar 13 (Admin) 12/19/2016         Past Medical History:     Past Medical History:   Diagnosis Date   . Chronic pain of right ankle 12/19/2016   . Essential hypertension, benign 12/17/2016   .  Hypothyroidism, adult 12/17/2016   . Mixed hyperlipidemia 12/17/2016   . Obstructive sleep apnea 12/17/2016   . Type 2 diabetes mellitus without complication, without long-term current use of insulin (CMS HCC) 12/17/2016             Past Surgical History:     Past Surgical History:   Procedure Laterality Date   . HX APPENDECTOMY     . HX BACK SURGERY     . REPLACEMENT TOTAL KNEE     . SHOULDER SURGERY     . VEIN LIGATION               Family History:     Family Medical History:     Problem Relation (Age of Onset)    Leukemia Mother    Lymphoma Father                Social History:   [name]  reports that she has never smoked. She has never used smokeless tobacco. She reports that she does not drink alcohol.          ROS:  Constitutional: Denies fevers, chills, night sweats. No recent weight changes or fatigue.   Eyes: Denies change in vision. No irritation, erythema, discharge or pain.   Ears: Denies any difficulty with hearing. No ear pain, tinnitus or discharge.  Mouth/Throat: Denies any oral ulcers or other lesions. No sore throat, hoarseness or dysphagia.  Cardiovascular: Denies any chest pain,  palpitations, PND or DOE  Respiratory: Denies any shortness of breath, wheezing, cough   GI: does have the occ ibs symptoms.   GU: Denies any dysuria, frequency, hematuria, hesitancy. Denies nocturia or incontinence.  Musculoskeletal: Denies any arthritis or edema. No muscle/joint pain or swelling.   Neurological: No history of syncope or seizures. Denies any dizziness or headaches.  Emotional/Psychiatric: Denies any memory issues. No history of depression or anxiety.  Skin: Denies any recent rashes or lesions.         Objective:   Vitals:    Vitals:    01/11/19 0931   BP: (!) 140/92   Pulse: 74   Resp: 14   Temp: 35.8 C (96.5 F)   TempSrc: Thermal Scan   SpO2: 97%   Weight: 99.2 kg (218 lb 11.2 oz)          Body mass index is 34.77 kg/m.      Constitutional: Alert, well developed, well nourished  HEENT:  Head: NC/AT    Eyes: Sclera anicteric, conjunctiva not injected    Ears: EAC normal, bilateral TMs clear    Nose: No discharge    Throat: MMM, posterior pharynx without erythema or exudate  Neck:   Supple with normal ROM, no cervical LAD, no thyromegaly, no JVD, no carotid bruits  Cardiovascular: RRR, normal S1/S2, no murmurs/rubs/gallops  Pulmonary:  CTAB, equal air entry, nonlabored, no wheezes/crackles/rhonchi  Abdomen:   NABS, NT/ND, soft, no HSM, no masses  Musculoskeletal:  No deformity, no injury, no edema  Neurological:   Alert, oriented x 3, no abnormal tone  Skin:     Warm, pink, dry, no rashes, no jaundice, no pallor, no cyanosis  Psychiatric:  Normal mood, affect, behavior, judgment, and thought content        Assessment & Plan:       ICD-10-CM    1. Type 2 diabetes mellitus without complication, without long-term current use of insulin (CMS HCC)  E11.9 Hemoglobin A1C    with elevated a1c   2.  Mixed hyperlipidemia  E78.2 Lipid Panel   3. Essential hypertension, benign  I10 CBC     Comp Metabolic Panel- Fasting   4. Obstructive sleep apnea  G47.33    5. Hypothyroidism, adult  E03.9 TSH Sensitive   6.  Lumbar radiculopathy  M54.16    7. Chronic pain of right ankle  M25.571     G89.29        Orders Placed This Encounter   . CBC   . Comp Metabolic Panel- Fasting   . Lipid Panel   . TSH Sensitive   . Hemoglobin A1C   . glimepiride (AMARYL) 1 mg Oral Tablet                   Health Maintenance   Topic Date Due   . Adult Tdap-Td (1 - Tdap) 04/20/1965   . Hepatitis C screening  04/21/1991   . Mammography  04/20/1996   . Shingles Vaccine (1 of 2) 04/20/1996   . Osteoporosis screening  04/21/2011   . Pneumococcal 65+ Years Low Risk (2 of 2 - PPSV23) 12/19/2017   . Influenza Vaccine (1) 11/09/2018   . Annual Wellness Visit  11/25/2018   . Hemoglobin A1C for Diabetes Control  07/27/2019   . Depression Screening  08/30/2019   . Colonoscopy  07/02/2025   We did review patient diabetes status and need for continued diet, exercise and regular follow up with labs and A1c. I did discuss with the patient and her a1c did increased slightly from her prior and she is at 7.4% and I did rec that we add the Amaryl back to her regimen and that will follow her a1c and she will increase her diet and activity. I did rec that she continue her other medications and we did review her IBS and did rec daily probiotic. We did rec yearly ophthalmology exams and also regular foot exam and did review foot care. Patient will continue to monitor blood glucose and call with any issues or concerns.    Return in about 3 months (around 04/13/2019).    Algis DownsPaul E [employer] Jr., DO

## 2019-01-10 NOTE — Telephone Encounter (Signed)
Department of Community Practice     Attempted to contact patient to complete COVID-19 prescreen prior to scheduled appointment. Unable to reach patient at this time, left message for patient to call back in and complete prescreening prior to appointment. Call back number provided [facility] [phone].    Linda [name]  01/10/2019, 15:33

## 2019-01-11 ENCOUNTER — Ambulatory Visit (INDEPENDENT_AMBULATORY_CARE_PROVIDER_SITE_OTHER): Payer: Medicare Other | Admitting: Family Medicine

## 2019-01-11 ENCOUNTER — Encounter (INDEPENDENT_AMBULATORY_CARE_PROVIDER_SITE_OTHER): Payer: Self-pay | Admitting: Family Medicine

## 2019-01-11 ENCOUNTER — Other Ambulatory Visit: Payer: Self-pay

## 2019-01-11 VITALS — BP 140/92 | HR 74 | Temp 96.5°F | Resp 14 | Wt 218.7 lb

## 2019-01-11 DIAGNOSIS — M25571 Pain in right ankle and joints of right foot: Secondary | ICD-10-CM

## 2019-01-11 DIAGNOSIS — E119 Type 2 diabetes mellitus without complications: Secondary | ICD-10-CM

## 2019-01-11 DIAGNOSIS — M5416 Radiculopathy, lumbar region: Secondary | ICD-10-CM

## 2019-01-11 DIAGNOSIS — G8929 Other chronic pain: Secondary | ICD-10-CM

## 2019-01-11 DIAGNOSIS — E039 Hypothyroidism, unspecified: Secondary | ICD-10-CM

## 2019-01-11 DIAGNOSIS — G4733 Obstructive sleep apnea (adult) (pediatric): Secondary | ICD-10-CM

## 2019-01-11 DIAGNOSIS — I10 Essential (primary) hypertension: Secondary | ICD-10-CM

## 2019-01-11 DIAGNOSIS — E782 Mixed hyperlipidemia: Secondary | ICD-10-CM

## 2019-01-11 MED ORDER — GLIMEPIRIDE 1 MG TABLET
1.00 mg | ORAL_TABLET | Freq: Every morning | ORAL | 4 refills | Status: DC
Start: 2019-01-11 — End: 2020-02-06

## 2019-01-14 ENCOUNTER — Encounter (INDEPENDENT_AMBULATORY_CARE_PROVIDER_SITE_OTHER): Payer: Self-pay | Admitting: Family Medicine

## 2019-04-02 ENCOUNTER — Other Ambulatory Visit (INDEPENDENT_AMBULATORY_CARE_PROVIDER_SITE_OTHER): Payer: Self-pay | Admitting: Family Medicine

## 2019-04-13 ENCOUNTER — Encounter (INDEPENDENT_AMBULATORY_CARE_PROVIDER_SITE_OTHER): Payer: Self-pay

## 2019-04-13 ENCOUNTER — Encounter (INDEPENDENT_AMBULATORY_CARE_PROVIDER_SITE_OTHER): Payer: Self-pay | Admitting: Family Medicine

## 2019-05-10 ENCOUNTER — Other Ambulatory Visit (INDEPENDENT_AMBULATORY_CARE_PROVIDER_SITE_OTHER): Payer: Self-pay | Admitting: Family Medicine

## 2019-05-10 NOTE — Telephone Encounter (Signed)
Last scheduled appointment with you was Visit date not found.  Currently scheduled future appointment is Visit date not found.    Patient has been seen within the last year: Yes.    Confirmed preferred pharmacy for this refill encounter is   Preferred Pharmacy     [facility] - [place], [place] - [address]    [address] [place] [address]    Phone: [phone] Fax: [phone]    Hours: Not open 24 hours      .     Deberah Castle, MA  05/10/2019, 10:06

## 2019-05-26 ENCOUNTER — Encounter (INDEPENDENT_AMBULATORY_CARE_PROVIDER_SITE_OTHER): Payer: Self-pay | Admitting: Family Medicine

## 2019-05-26 LAB — ENTER/EDIT EXTERNAL COMMON LAB RESULTS
CHOLESTEROL: 206
HDL-CHOLESTEROL: 44
HEMOGLOBIN A1C: 6.6
LDL CHOLESTEROL,DIRECT: 122
TRIGLYCERIDES: 201

## 2019-05-30 ENCOUNTER — Telehealth (INDEPENDENT_AMBULATORY_CARE_PROVIDER_SITE_OTHER): Payer: Self-pay | Admitting: Family Medicine

## 2019-05-30 ENCOUNTER — Encounter (INDEPENDENT_AMBULATORY_CARE_PROVIDER_SITE_OTHER): Payer: Self-pay | Admitting: Family Medicine

## 2019-05-30 NOTE — Telephone Encounter (Signed)
Department of Community Practice     Attempted to contact patient to complete COVID-19 prescreen prior to scheduled appointment. Unable to reach patient at this time, left message for patient to call back in and complete prescreening prior to appointment. Call back number provided [facility] [phone].    Eliane Decree  05/30/2019, 09:05

## 2019-05-30 NOTE — Progress Notes (Signed)
FAMILY MEDICINE [place]  [address]  [place] [address]  [facility]  Medicare Annual Wellness Visit    Name: Linda [name] MRN:  [id number]   Date: 05/31/2019 Age: 74 y.o.       SUBJECTIVE:   Linda [name] is a 74 y.o. female for presenting for Medicare Wellness exam.   I have reviewed and reconciled the medication list with the patient today.  She states that she has been having some dizziness over the last several weeks. She states that she just feels like her balance is off. She feels tired at times. She did have recent episode of low BS and this did improve with eating. She states that this is when she gets up at times and changes positions. She denies any vision changes. She has to be careful when she is doing things. She will easily lose her balance. She did have the covid vaccine.   Comprehensive Health Assessment:  Paper document COMPREHENSIVE HEALTH ASSESSMENT reviewed, signed and scanned into medical record    I have reviewed and updated as appropriate the past medical, family and social history. 05/31/2019 as summarized below:  Past Medical History:   Diagnosis Date   . Chronic pain of right ankle 12/19/2016   . Essential hypertension, benign 12/17/2016   . Hypothyroidism, adult 12/17/2016   . Mixed hyperlipidemia 12/17/2016   . Obstructive sleep apnea 12/17/2016   . Type 2 diabetes mellitus without complication, without long-term current use of insulin (CMS HCC) 12/17/2016     Past Surgical History:   Procedure Laterality Date   . Hx appendectomy     . Hx back surgery     . Replacement total knee     . Shoulder surgery     . Vein ligation       Current Outpatient Medications   Medication Sig   . Blood Sugar Diagnostic (CONTOUR NEXT TEST STRIPS) Strip check BS twice a day   . cholecalciferol, vitamin D3, 1,250 mcg (50,000 unit) Oral Capsule Take 1 Cap (50,000 Units total) by mouth Every 7 days   . glimepiride (AMARYL) 1 mg Oral Tablet Take 1 Tab (1 mg total) by mouth Every  morning   . Ibuprofen (MOTRIN) 800 mg Oral Tablet take 1 tablet by mouth three times a day   . levothyroxine (SYNTHROID) 125 mcg Oral Tablet take 1 tablet by mouth once daily   . losartan (COZAAR) 50 mg Oral Tablet take 1 tablet by mouth once daily   . meclizine (ANTIVERT) 12.5 mg Oral Tablet Take 1 Tablet (12.5 mg total) by mouth Every 8 hours as needed for Dizziness   . pravastatin (PRAVACHOL) 80 mg Oral Tablet take 1 tablet by mouth once daily   . SITagliptin-metformin (JANUMET) 50-1,000 mg Oral Tablet Take 1 Tab by mouth Twice daily with food     Family Medical History:     Problem Relation (Age of Onset)    Leukemia Mother    Lymphoma Father            Social History     Socioeconomic History   . Marital status: Married     Spouse name: Not on file   . Number of children: Not on file   . Years of education: Not on file   . Highest education level: Not on file   Tobacco Use   . Smoking status: Never Smoker   . Smokeless tobacco: Never Used   Substance and Sexual Activity   .  Alcohol use: No     Social Determinants of Company secretary Strain:    . Difficulty of Paying Living Expenses:    Food Insecurity:    . Worried About Charity fundraiser in the Last Year:    . Arboriculturist in the Last Year:    Transportation Needs:    . Film/video editor (Medical):    Marland Kitchen Lack of Transportation (Non-Medical):    Physical Activity:    . Days of Exercise per Week:    . Minutes of Exercise per Session:    Stress:    . Feeling of Stress :    Intimate Partner Violence:    . Fear of Current or Ex-Partner:    . Emotionally Abused:    Marland Kitchen Physically Abused:    . Sexually Abused:      Review of Systems: Pertinent items are noted in HPI.     List of Current Health Care Providers   Care Team     PCP     Name Type Specialty Phone Number    Means, Marice Potter., DO Physician [place] [phone]          Care Team     No care team found                  Health Maintenance   Topic Date Due   . Osteoporosis screening   Never done   . Hepatitis C screening  Never done   . Adult Tdap-Td (1 - Tdap) Never done   . Mammography  Never done   . Shingles Vaccine (1 of 2) Never done   . Pneumococcal 65+ Years Low Risk (1 of 2 - PPSV23) 02/13/2017   . Influenza Vaccine (1) 11/09/2018   . Hemoglobin A1C for Diabetes Control  07/27/2019   . Depression Screening  05/30/2020   . Annual Wellness Visit  05/30/2020   . Colonoscopy  07/02/2025     Medicare Wellness Assessment   Medicare initial or wellness physical in the last year?: No  Advance Directives (optional)   Does patient have a living will or MPOA: YES   Has patient provided [employer] with a copy?: no          Activities of Daily Living   Do you need help with dressing, bathing, or walking?: No   Do you need help with shopping, housekeeping, medications, or finances?: No   Do you have rugs in hallways, broken steps, or poor lighting?: No   Do you have grab bars in your bathroom, non-slip strips in your tub, and hand rails on your stairs?: Yes   Urinary Incontinence Screen (Women >=65 only)   Do you ever leak urine when you don't want to?: YES   Cognitive Function Screen (1=Yes, 0=No)   What is you age?: Correct   What is the time to the nearest hour?: Correct   What is the year?: Correct   What is the name of this clinic?: Correct   Can the patient recognize two persons (the doctor, the nurse, home help, etc.)?: Correct   What is the date of your birth? (day and month sufficient) : Correct   In what year did World War II end?: Correct   Who is the current president of the [place]?: Correct   Count from 20 down to 1?: Correct   What address did I give you earlier?: Correct   Total Score:  10       Hearing Screen   Have you noticed any hearing difficulties?: Yes  After whispering 9-1-6 how many numbers did the patient repeat correctly?: 3   Fall Risk Screen   Do you feel unsteady when standing or walking?: No (little dizzy and lightheaded)  Do you worry about falling?: No  Have  you fallen in the past year?: No   Vision Screen   Right Eye = 20: 20   Left Eye = 20: 20   Depression Screen   Little interest or pleasure in doing things.: Not at all  Feeling down, depressed, or hopeless: Not at all  PHQ 2 Total: 0        OBJECTIVE:   BP 138/84   Pulse 72   Temp 35.6 C (96.1 F)   Ht 1.676 m (5\' 6" )   Wt 99.8 kg (220 lb)   SpO2 98%   BMI 35.51 kg/m        Other appropriate exam:  General: Patient is very pleasant and cooperative and in no acute distress.  HEENT: Perl, eomi, tm intact. Nares clear. Oral mucosa pink and moist. No Lesions. TM intact bilaterally.  Neck: Soft, Supple, no masses, Thyroid palpated and not enlarged.  Heart: Regular rate and rhythm and no murmurs, clicks or rubs.  Lungs: Clear to auscultation bilaterally. Good air movement. No wheezing or rhochi.  Abdomen: Soft, non tender, no masses , no organomegaly. + normoactive/ normopitch bowel sounds.  Extremities: Intact x 4. no gross deformity and no edema.  Musculoskeletal: Good ROM. Spine intact and no gross deformities.  Neurologic: Strength and sensation grossly equal and appropriate. No focal deficits noted.  Skin: No lesions or rashes noted.    Health Maintenance Due   Topic Date Due   . Osteoporosis screening  Never done   . Hepatitis C screening  Never done   . Adult Tdap-Td (1 - Tdap) Never done   . Mammography  Never done   . Shingles Vaccine (1 of 2) Never done   . Pneumococcal 65+ Years Low Risk (1 of 2 - PPSV23) 02/13/2017   . Influenza Vaccine (1) 11/09/2018      ASSESSMENT & PLAN:   1. Medicare annual wellness visit, subsequent    2. Vertigo    3. Mixed hyperlipidemia    4. Essential hypertension, benign    5. Type 2 diabetes mellitus without complication, without long-term current use of insulin (CMS HCC)    6. Hypothyroidism, adult       Identified Risk Factors/ Recommended Actions           Orders Placed This Encounter   . CBC   . Comp Metabolic Panel- Fasting   . Lipid Panel   . Hemoglobin A1C   . TSH  Sensitive   . Refer to Physical Therapy-External   . meclizine (ANTIVERT) 12.5 mg Oral Tablet        I did discuss with the patient and I did review her most recent labs and her a1c is still at goal at 6.6% and her lipids are overall stable with the current pravastatin. Her BP is stable. I did review her "dizziness" and this per her history appears to be vertigo in nature and I did rec that we refer her to vestibular therapy and will await their eval and treatment recommendations. If this would not improve, worsen or change , will do further neurologic w/u. She will continue her other medications as rx.  The patient has been educated about risk factors and recommended preventive care. Written Prevention Plan completed/ updated and given to patient (see After Visit Summary).    Return in about 3 months (around 08/31/2019).    Jacob Moores., DO  FAMILY MEDICINE [place]  [address]  [place] [address]  Phone: [phone]  Fax: [phone]

## 2019-05-31 ENCOUNTER — Other Ambulatory Visit: Payer: Self-pay

## 2019-05-31 ENCOUNTER — Encounter (INDEPENDENT_AMBULATORY_CARE_PROVIDER_SITE_OTHER): Payer: Self-pay | Admitting: Family Medicine

## 2019-05-31 ENCOUNTER — Ambulatory Visit (INDEPENDENT_AMBULATORY_CARE_PROVIDER_SITE_OTHER): Payer: Medicare Other | Admitting: Family Medicine

## 2019-05-31 VITALS — BP 138/84 | HR 72 | Temp 96.1°F | Ht 66.0 in | Wt 220.0 lb

## 2019-05-31 DIAGNOSIS — E039 Hypothyroidism, unspecified: Secondary | ICD-10-CM

## 2019-05-31 DIAGNOSIS — E119 Type 2 diabetes mellitus without complications: Secondary | ICD-10-CM

## 2019-05-31 DIAGNOSIS — E782 Mixed hyperlipidemia: Secondary | ICD-10-CM

## 2019-05-31 DIAGNOSIS — I10 Essential (primary) hypertension: Secondary | ICD-10-CM

## 2019-05-31 DIAGNOSIS — R42 Dizziness and giddiness: Secondary | ICD-10-CM | POA: Insufficient documentation

## 2019-05-31 DIAGNOSIS — Z0000 Encounter for general adult medical examination without abnormal findings: Secondary | ICD-10-CM

## 2019-05-31 MED ORDER — MECLIZINE 12.5 MG TABLET
12.5000 mg | ORAL_TABLET | Freq: Three times a day (TID) | ORAL | 4 refills | Status: DC | PRN
Start: 2019-05-31 — End: 2020-08-22

## 2019-05-31 NOTE — Patient Instructions (Signed)
Medicare Preventive Services  Medicare coverage information Recommendation for YOU   Heart Disease and Diabetes   Lipid profile Every 5 years or more often if at risk for cardiovascular disease  Last Lipid Panel  (Last result in the past 2 years)      Cholesterol   HDL   LDL   Direct LDL   Triglycerides      05/26/19 0000 206 44   122 201         Diabetes Screening  yearly for those at risk for diabetes, 2 tests per year for those with prediabetes Last Glucose: 134    Diabetes Self Management Training or Medical Nutrition Therapy  For those with diabetes, up to 10 hrs initial training within a year, subsequent years up to 2 hrs of follow up training Optional for those with diabetes     Medical Nutrition Therapy Three hours of one-on-one counseling in first year, two hours in subsequent years Optional for those with diabetes, kidney disease   Intensive Behavioral Therapy for Obesity  Face-to-face counseling, first month every week, month 2-6 every other week, month 7-12 every month if continued progress is documented Optional for those with Body Mass Index 30 or higher  Your Body mass index is 35.51 kg/m.   Tobacco Cessation (Quitting) Counseling   Two attempts per year, max 4 sessions per attempt, up to 8 per year, for those with tobacco-related health condition Optional for those that use tobacco   Cancer Screening   Colorectal screening   For anyone age 39 to 23 or any age if high risk:  . Screening Colonoscopy every 10 yrs if low risk,  more frequent if higher risk  OR  . Flexible  Sigmoidoscopy  every 5 yr OR  . Fecal Occult Blood Testing yearly OR  . Cologuard Stool DNA test once every 3 years OR  . CT Colonography every 5 yrs    See your schedule below   Screening Pap Test Recommended every 3 years for all women age 27 to 73, or every five years if combined with HPV test (routine screening not needed after total hysterectomy).  Medicare covers every 2 years, up to yearly if high risk.  Screening Pelvic Exam  Medicare covers every 2 years, yearly if high risk or childbearing age with abnormal Pap in last 3 yrs. See your schedule below   Screening Mammogram   Recommended every 1-2 years for women age 66 to 53, and selectively recommended for women between 59-49 based on shared decisions about risk. Covered by Medicare up to every year for women age 71 or older See your schedule below   Lung Cancer Screening  Annual low dose computed tomography (LDCT scan) is recommended for those age 4-77 who smoked 30 pack-years and are current smokers or quit smoking within past 15 years (one pack-year= smoking one PPD for one year), after counseling by your doctor or nurse clinician about the possible benefits or harms See your schedule below   Vaccinations   Pneumococcal Vaccine Recommended routinely age 29+ with two separate vaccines one year apart (Prevnar then Pneumovax).  Recommended before age 83 if medical conditions increase risk  Seasonal Influenza Vaccine Once every flu season   Hepatitis B Vaccine 3 doses if risk (including anyone with diabetes or liver disease)  Shingles Vaccine Once or twice at age 24 or older  Diphtheria Tetanus Pertussis Vaccine ONCE as adult, booster every 10 years     Immunization History   Administered  Date(s) Administered   . Influenza Vaccine, 6 month-adult 12/19/2016   . PREVNAR 13 (ADMIN) 12/19/2016     Shingles vaccine and Diphtheria Tetanus Pertussis vaccines are available at pharmacies or local health department without a prescription.   Other Screening   Bone Densitometry   Every 24 months for anyone at risk, including postmenopausal       Glaucoma Screening   Yearly if in high risk group such as diabetes, family history, African American age 34+ or Hispanic American age 61+      Hepatitis C Screening recommended ONCE for those born between 1945-1965, or high risk for HCV infection     HIV Testing   Yearly or up to 3 times in pregnancy     Abdominal Aortic Aneurysm Screening Ultrasound   Once  between the age of 54-75 with a family history of AAA       Your Personalized Schedule for Preventive Tests   Health Maintenance: Pending and Last Completed       Date Due Completion Date    Osteoporosis screening Never done ---    Hepatitis C screening Never done ---    Adult Tdap-Td (1 - Tdap) Never done ---    Mammography Never done ---    Shingles Vaccine (1 of 2) Never done ---    Pneumococcal 65+ Years Low Risk (1 of 2 - PPSV23) 02/13/2017 12/19/2016    Influenza Vaccine (1) 11/09/2018 12/19/2016    Hemoglobin A1C for Diabetes Control 07/27/2019 07/27/2018    Depression Screening 05/30/2020 05/31/2019    Annual Wellness Visit 05/30/2020 05/31/2019    Colonoscopy 07/02/2025 07/03/2015

## 2019-05-31 NOTE — Progress Notes (Signed)
Tried calling patient about lab results.  Linda [name], [place]  05/31/2019, 12:02

## 2019-05-31 NOTE — Nursing Note (Signed)
05/31/19 0900   Medicare Wellness Assessment   Medicare initial or wellness physical in the last year? No   Advance Directives   Does patient have a living will or MPOA YES   Has patient provided [employer] with a copy? no   Activities of Daily Living   Do you need help with dressing, bathing, or walking? No   Do you need help with shopping, housekeeping, medications, or finances? No   Do you have rugs in hallways, broken steps, or poor lighting? No   Do you have grab bars in your bathroom, non-slip strips in your tub, and hand rails on your stairs? Yes   Urinary Incontinence Screen-Women only   Do you ever leak urine when you don't want to? YES   Cognitive Function Screen   What is you age? 1   What is the time to the nearest hour? 1   What is the year? 1   What is the name of this clinic? 1   Can the patient recognize two persons (the doctor, the nurse, home help, etc.)? 1   What is the date of your birth? (day and month sufficient)  1   In what year did World War II end? 1   Who is the current president of the [place]? 1   Count from 20 down to 1? 1   What address did I give you earlier? 1   Total Score 10   Depression Screen   Little interest or pleasure in doing things. 0   Feeling down, depressed, or hopeless 0   PHQ 2 Total 0   Hearing Screen   Have you noticed any hearing difficulties? Yes   After whispering 9-1-6 how many numbers did the patient repeat correctly? 3   Fall Risk Assessment   Do you feel unsteady when standing or walking? No  (little dizzy and lightheaded)   Do you worry about falling? No   Have you fallen in the past year? No   Vision Screen   Right Eye = 20 20   Left Eye = 20 20

## 2019-05-31 NOTE — Nursing Note (Signed)
labs stable, a1c very good

## 2019-06-27 ENCOUNTER — Telehealth (INDEPENDENT_AMBULATORY_CARE_PROVIDER_SITE_OTHER): Payer: Self-pay | Admitting: Family Medicine

## 2019-06-27 ENCOUNTER — Encounter (INDEPENDENT_AMBULATORY_CARE_PROVIDER_SITE_OTHER): Payer: Self-pay | Admitting: Family Medicine

## 2019-06-27 NOTE — Telephone Encounter (Signed)
Spoke to Patient about her mammogram results.  Linda [name], [place]  06/27/2019, 14:38

## 2019-08-25 LAB — HGA1C (HEMOGLOBIN A1C WITH EST AVG GLUCOSE)
ESTIMATED AVERAGE GLUCOSE: 143
HEMOGLOBIN A1C: 6.6

## 2019-08-26 ENCOUNTER — Encounter (INDEPENDENT_AMBULATORY_CARE_PROVIDER_SITE_OTHER): Payer: Self-pay | Admitting: Family Medicine

## 2019-08-26 ENCOUNTER — Other Ambulatory Visit (INDEPENDENT_AMBULATORY_CARE_PROVIDER_SITE_OTHER): Payer: Self-pay | Admitting: Family Medicine

## 2019-08-31 ENCOUNTER — Telehealth (INDEPENDENT_AMBULATORY_CARE_PROVIDER_SITE_OTHER): Payer: Self-pay | Admitting: Family Medicine

## 2019-08-31 NOTE — Progress Notes (Signed)
OUTPATIENT PROGRESS NOTE    Subjective:   Patient ID:  Linda [name] is a pleasant 74 y.o. female.    Chief Complaint: Diabetes Follow up and Follow Up 3 Months      History of Present Illness:  DM: Fasting FS range 140   Nonfasting FS range not checking   Hypoglycemia episodes  No   Compliant with diabetic diet Yes   Sores on feet No  Diabetes Monitors  A1C: 6.6  A1C Date: 08/25/2019           Nephropathy Screening: On ACEI or ARB    Last Lipid Panel  (Last result in the past 2 years)      Cholesterol   HDL   LDL   Direct LDL   Triglycerides      05/26/19 0000 206 44   122 201        Retinal Exam Date: Not Found  Last diabetic foot exam: Not Found  HTN: HAs No    Dizziness No   Feeling like BP is too high or low No   Compliant with taking meds Yes   Home BP:  not doing  HLD: tolerating cholosterol lowering medication     "healthy" diet  in general   Is pt fasting today? No   Hypothyroidism: Hair changes No     Skin changes No     Diarrhea or constipation  No     Heat or cold intolerance  No     Palpitations No     Compliant with taking Synthroid Yes   She states that she has overall been doing about the same. She does take her medications as rx and she does try to watch her diet and she does try to stay active but she admits that she hasn't been exercising. She denies any cp or sob.   The history is provided by the patient.      Allergies:   No Known Allergies      Medications:   Blood Sugar Diagnostic (CONTOUR NEXT TEST STRIPS) Strip, check BS twice a day  cholecalciferol, vitamin D3, 1,250 mcg (50,000 unit) Oral Capsule, Take 1 Cap (50,000 Units total) by mouth Every 7 days  glimepiride (AMARYL) 1 mg Oral Tablet, Take 1 Tab (1 mg total) by mouth Every morning  Ibuprofen (MOTRIN) 800 mg Oral Tablet, take 1 tablet by mouth three times a day  levothyroxine (SYNTHROID) 125 mcg Oral Tablet, take 1 tablet by mouth once daily  losartan (COZAAR) 50 mg Oral Tablet, take 1 tablet by mouth once daily  meclizine (ANTIVERT) 12.5  mg Oral Tablet, Take 1 Tablet (12.5 mg total) by mouth Every 8 hours as needed for Dizziness  pravastatin (PRAVACHOL) 80 mg Oral Tablet, take 1 tablet by mouth once daily  SITagliptin-metformin (JANUMET) 50-1,000 mg Oral Tablet, Take 1 Tab by mouth Twice daily with food    No facility-administered medications prior to visit.        Immunization History:     Immunization History   Administered Date(s) Administered    Influenza Vaccine, 6 month-adult 12/19/2016    PREVNAR 13 (ADMIN) 12/19/2016         Past Medical History:     Past Medical History:   Diagnosis Date    Chronic pain of right ankle 12/19/2016    Essential hypertension, benign 12/17/2016    Hypothyroidism, adult 12/17/2016    Mixed hyperlipidemia 12/17/2016    Obstructive sleep apnea 12/17/2016    Type 2 diabetes  mellitus without complication, without long-term current use of insulin (CMS HCC) 12/17/2016             Past Surgical History:     Past Surgical History:   Procedure Laterality Date    HX APPENDECTOMY      HX BACK SURGERY      REPLACEMENT TOTAL KNEE      SHOULDER SURGERY      VEIN LIGATION               Family History:     Family Medical History:     Problem Relation (Age of Onset)    Leukemia Mother    Lymphoma Father                Social History:   Jolane Bankhead  reports that she has never smoked. She has never used smokeless tobacco. She reports that she does not drink alcohol.          ROS:  Constitutional: Denies fevers, chills, night sweats. No recent weight changes or fatigue.   Eyes: Denies change in vision. No irritation, erythema, discharge or pain.   Ears: Denies any difficulty with hearing. No ear pain, tinnitus or discharge.  Mouth/Throat: Denies any oral ulcers or other lesions. No sore throat, hoarseness or dysphagia.  Cardiovascular: Denies any chest pain, palpitations, PND or DOE  Respiratory: Denies any shortness of breath, wheezing, cough   GI: Denies any abdominal pain, nausea, vomiting, diarrhea or constipation.  No melena or hematochezia.  GU: Denies any dysuria, frequency, hematuria, hesitancy. Denies nocturia or incontinence.  Musculoskeletal: Does have chronic ankle pain.   Neurological: No history of syncope or seizures. Denies any dizziness or headaches.  Emotional/Psychiatric: Denies any memory issues. No history of depression or anxiety.  Skin: Denies any recent rashes or lesions.         Objective:   Vitals:    Vitals:    09/01/19 1041   BP: (!) 140/82   Pulse: 73   Temp: 36.2 C (97.1 F)   SpO2: 98%   Height: 1.676 m (5\' 6" )          Body mass index is 35.51 kg/m.      Constitutional: Alert, well developed, well nourished  HEENT:  Head: NC/AT    Eyes: Sclera anicteric, conjunctiva not injected    Ears: EAC normal, bilateral TMs clear    Nose: No discharge    Throat: MMM, posterior pharynx without erythema or exudate  Neck:   Supple with normal ROM, no cervical LAD, no thyromegaly, no JVD, no carotid bruits  Cardiovascular: RRR, normal S1/S2, no murmurs/rubs/gallops  Pulmonary:  CTAB, equal air entry, nonlabored, no wheezes/crackles/rhonchi  Abdomen:   NABS, NT/ND, soft, no HSM, no masses  Musculoskeletal:    Neurological:   Alert, oriented x 3, no abnormal tone  Skin:     Warm, pink, dry, no rashes, no jaundice, no pallor, no cyanosis  Psychiatric:  Normal mood, affect, behavior, judgment, and thought content        Assessment & Plan:       ICD-10-CM    1. Essential hypertension, benign  I10 CBC     COMPREHENSIVE METABOLIC PNL, FASTING   2. Mixed hyperlipidemia  E78.2 LIPID PANEL   3. Obstructive sleep apnea  G47.33    4. Type 2 diabetes mellitus without complication, without long-term current use of insulin (CMS HCC)  E11.9 HGA1C (HEMOGLOBIN A1C WITH EST AVG GLUCOSE)   5. Hypothyroidism, adult  E03.9 THYROID STIMULATING HORMONE WITH FREE T4 REFLEX     T3, Free   6. Chronic pain of right ankle  M25.571     G89.29        Orders Placed This Encounter    CBC    COMPREHENSIVE METABOLIC PNL, FASTING    LIPID PANEL      HGA1C (HEMOGLOBIN A1C WITH EST AVG GLUCOSE)    THYROID STIMULATING HORMONE WITH FREE T4 REFLEX    T3, Free                 Health Maintenance   Topic Date Due    Osteoporosis screening  Never done    Hepatitis C screening  Never done    Adult Tdap-Td (1 - Tdap) Never done    Mammography  Never done    Shingles Vaccine (1 of 2) Never done    Pneumococcal 65+ Years Low Risk (1 of 2 - PPSV23) 02/13/2017    Influenza Vaccine (Season Ended) 11/09/2019    Depression Screening  05/30/2020    Annual Wellness Visit  05/30/2020    Hemoglobin A1C for Diabetes Control  08/24/2020    Colonoscopy  07/02/2025   We did review patient diabetes status and need for continued diet, exercise and regular follow up with labs and A1c. I did discuss with the patient and I did review her most recent labs and her a1c is excellent at 6.6%. Her lipids are stable with tchol of 187 and LDL at 107 and we did review that would like to improve these and did rec Crestor at 20mg  daily. Her TSH is WNL. I did rec that she continue her other medications as rx.  We did rec yearly ophthalmology exams and also regular foot exam and did review foot care. Patient will continue to monitor blood glucose and call with any issues or concerns. Her BP is stable.     Return in about 3 months (around 12/02/2019).    Junious Dresser [employer] DO

## 2019-08-31 NOTE — Telephone Encounter (Signed)
Department of Community Practice     Attempted to contact patient to complete COVID-19 prescreen prior to scheduled appointment. Unable to reach patient at this time, left message for patient to call back in and complete prescreening prior to appointment. Call back number provided [facility] [phone].    Eliane Decree  08/31/2019, 09:22

## 2019-09-01 ENCOUNTER — Encounter (INDEPENDENT_AMBULATORY_CARE_PROVIDER_SITE_OTHER): Payer: Self-pay | Admitting: Family Medicine

## 2019-09-01 ENCOUNTER — Ambulatory Visit (INDEPENDENT_AMBULATORY_CARE_PROVIDER_SITE_OTHER): Payer: Medicare Other | Admitting: Family Medicine

## 2019-09-01 ENCOUNTER — Other Ambulatory Visit: Payer: Self-pay

## 2019-09-01 VITALS — BP 140/82 | HR 73 | Temp 97.1°F | Ht 66.0 in

## 2019-09-01 DIAGNOSIS — G8929 Other chronic pain: Secondary | ICD-10-CM

## 2019-09-01 DIAGNOSIS — E119 Type 2 diabetes mellitus without complications: Secondary | ICD-10-CM

## 2019-09-01 DIAGNOSIS — M25571 Pain in right ankle and joints of right foot: Secondary | ICD-10-CM

## 2019-09-01 DIAGNOSIS — E782 Mixed hyperlipidemia: Secondary | ICD-10-CM

## 2019-09-01 DIAGNOSIS — E039 Hypothyroidism, unspecified: Secondary | ICD-10-CM

## 2019-09-01 DIAGNOSIS — G4733 Obstructive sleep apnea (adult) (pediatric): Secondary | ICD-10-CM

## 2019-09-01 DIAGNOSIS — I10 Essential (primary) hypertension: Secondary | ICD-10-CM

## 2019-09-22 ENCOUNTER — Other Ambulatory Visit (INDEPENDENT_AMBULATORY_CARE_PROVIDER_SITE_OTHER): Payer: Self-pay | Admitting: Family Medicine

## 2019-12-21 ENCOUNTER — Other Ambulatory Visit (INDEPENDENT_AMBULATORY_CARE_PROVIDER_SITE_OTHER): Payer: Self-pay | Admitting: Family Medicine

## 2019-12-29 ENCOUNTER — Encounter (INDEPENDENT_AMBULATORY_CARE_PROVIDER_SITE_OTHER): Payer: Self-pay | Admitting: Family Medicine

## 2020-01-09 LAB — ENTER/EDIT EXTERNAL COMMON LAB RESULTS
CHOLESTEROL: 183
HEMOGLOBIN A1C: 6.5 %
LDL CHOLESTEROL,DIRECT: 105
TRIGLYCERIDES: 174

## 2020-01-11 NOTE — Progress Notes (Signed)
OUTPATIENT PROGRESS NOTE    Subjective:   Patient ID:  Ms. Denison is a pleasant 74 y.o. female.    Chief Complaint: Hypertension      History of Present Illness:  DM: Fasting FS range 100   Nonfasting FS range 130   Hypoglycemia episodes  No   Compliant with diabetic diet Yes   Sores on feet No  Diabetes Monitors  A1C: 6.6  A1C Date: 08/25/2019           Nephropathy Screening: On ACEI or ARB    Last Lipid Panel  (Last result in the past 2 years)      Cholesterol   HDL   LDL   Direct LDL   Triglycerides        05/26/19 0000 206 44   122 201         Retinal Exam Date: Not Found  Last diabetic foot exam: Not Found  HTN: HAs No    Dizziness No   Feeling like BP is too high or low No   Compliant with taking meds Yes   Home BP:  not doing  HLD: tolerating cholosterol lowering medication     "healthy" diet  in general   Is pt fasting today? No   Hypothyroidism: Hair changes Yes     Skin changes Yes     Diarrhea or constipation  Yes     Heat or cold intolerance  No     Palpitations No     Compliant with taking Synthroid Yes   She states that she is doing well and she did have recent illness after she did take the covid booster and she did have headache and fatigue. She has been taking her medications as rx and she denies any side effects. Her BS have been doing well. she denies any other new issues.  The history is provided by the patient.      Allergies:   No Known Allergies      Medications:   Blood Sugar Diagnostic (CONTOUR NEXT TEST STRIPS) Strip, check BS twice a day  cholecalciferol, vitamin D3, 1,250 mcg (50,000 unit) Oral Capsule, Take 1 Cap (50,000 Units total) by mouth Every 7 days  glimepiride (AMARYL) 1 mg Oral Tablet, Take 1 Tab (1 mg total) by mouth Every morning  JANUMET 50-1,000 mg Oral Tablet, take 1 tablet by mouth twice a day with food  meclizine (ANTIVERT) 12.5 mg Oral Tablet, Take 1 Tablet (12.5 mg total) by mouth Every 8 hours as needed for Dizziness  pravastatin (PRAVACHOL) 80 mg Oral Tablet, take 1  tablet by mouth once daily  Ibuprofen (MOTRIN) 800 mg Oral Tablet, take 1 tablet by mouth three times a day  levothyroxine (SYNTHROID) 125 mcg Oral Tablet, take 1 tablet by mouth once daily  losartan (COZAAR) 50 mg Oral Tablet, take 1 tablet by mouth once daily    No facility-administered medications prior to visit.        Immunization History:     Immunization History   Administered Date(s) Administered    Influenza Vaccine, 6 month-adult 12/19/2016    PREVNAR 13 (ADMIN) 12/19/2016         Past Medical History:     Past Medical History:   Diagnosis Date    Chronic pain of right ankle 12/19/2016    Essential hypertension, benign 12/17/2016    Hypothyroidism, adult 12/17/2016    Mixed hyperlipidemia 12/17/2016    Obstructive sleep apnea 12/17/2016    Type 2 diabetes  mellitus without complication, without long-term current use of insulin (CMS HCC) 12/17/2016             Past Surgical History:     Past Surgical History:   Procedure Laterality Date    HX APPENDECTOMY      HX BACK SURGERY      REPLACEMENT TOTAL KNEE      SHOULDER SURGERY      VEIN LIGATION               Family History:     Family Medical History:     Problem Relation (Age of Onset)    Leukemia Mother    Lymphoma Father                Social History:   Jakara Blatter  reports that she has never smoked. She has never used smokeless tobacco. She reports that she does not drink alcohol.          ROS:  Constitutional: Denies fevers, chills, night sweats. No recent weight changes or fatigue.   Eyes: Denies change in vision. No irritation, erythema, discharge or pain.   Ears: Denies any difficulty with hearing. No ear pain, tinnitus or discharge.  Mouth/Throat: Denies any oral ulcers or other lesions. No sore throat, hoarseness or dysphagia.  Cardiovascular: Denies any chest pain, palpitations, PND or DOE  Respiratory: Denies any shortness of breath, wheezing, cough   GI: Denies any abdominal pain, nausea, vomiting, diarrhea or constipation. No melena  or hematochezia.  GU: Denies any dysuria, frequency, hematuria, hesitancy. Denies nocturia or incontinence.  Musculoskeletal: Does have the chronic ankle pain.    Neurological: No history of syncope or seizures. Denies any dizziness or headaches.  Emotional/Psychiatric: Denies any memory issues. No history of depression or anxiety.  Skin: Denies any recent rashes or lesions.         Objective:   Vitals:    Vitals:    01/12/20 1007   BP: 120/70   Pulse: 66   Temp: 35.6 C (96 F)   SpO2: 99%   Weight: 95.7 kg (211 lb)   Height: 1.676 m (5\' 6" )   BMI: 34.13          Body mass index is 34.06 kg/m.      Constitutional: Alert, well developed, well nourished  HEENT:  Head: NC/AT    Eyes: Sclera anicteric, conjunctiva not injected    Ears: EAC normal, bilateral TMs clear    Nose: No discharge    Throat: MMM, posterior pharynx without erythema or exudate  Neck:   Supple with normal ROM, no cervical LAD, no thyromegaly, no JVD, no carotid bruits  Cardiovascular: RRR, normal S1/S2, no murmurs/rubs/gallops  Pulmonary:  CTAB, equal air entry, nonlabored, no wheezes/crackles/rhonchi  Abdomen:   NABS, NT/ND, soft, no HSM, no masses  Musculoskeletal:  No deformity, no injury, no edema  Neurological:   Alert, oriented x 3, no abnormal tone  Skin:     Warm, pink, dry, no rashes, no jaundice, no pallor, no cyanosis  Psychiatric:  Normal mood, affect, behavior, judgment, and thought content        Assessment & Plan:       ICD-10-CM    1. Type 2 diabetes mellitus without complication, without long-term current use of insulin (CMS HCC)  E11.9 HGA1C (HEMOGLOBIN A1C WITH EST AVG GLUCOSE)   2. Essential hypertension, benign  I10 CBC     COMPREHENSIVE METABOLIC PNL, FASTING     LIPID PANEL  3. Mixed hyperlipidemia  E78.2 COMPREHENSIVE METABOLIC PNL, FASTING     LIPID PANEL   4. Obstructive sleep apnea  G47.33    5. Lumbar radiculopathy  M54.16    6. Hypothyroidism, adult  E03.9 T3, Free     THYROID STIMULATING HORMONE WITH FREE T4 REFLEX    7. Chronic pain of right ankle  M25.571     G89.29        Orders Placed This Encounter    Flu Vaccine, 6 month-adult   Quad 0.5 mL IM (Admin)    CBC    COMPREHENSIVE METABOLIC PNL, FASTING    LIPID PANEL    HGA1C (HEMOGLOBIN A1C WITH EST AVG GLUCOSE)    T3, Free    THYROID STIMULATING HORMONE WITH FREE T4 REFLEX    losartan (COZAAR) 50 mg Oral Tablet    Ibuprofen (MOTRIN) 800 mg Oral Tablet    levothyroxine (SYNTHROID) 150 mcg Oral Tablet                 Health Maintenance   Topic Date Due    Diabetic Retinal Exam  Never done    Osteoporosis screening  Never done    Hepatitis C screening  Never done    Covid-19 Vaccine (1 of 2) Never done    Adult Tdap-Td (1 - Tdap) Never done    Mammography  Never done    Shingles Vaccine (1 of 2) Never done    Pneumococcal 65+ Years Low Risk (1 of 2 - PPSV23) 02/13/2017    Influenza Vaccine (1) 11/09/2019    Hemoglobin A1C for Diabetes  02/24/2020    Depression Screening  05/30/2020    Annual Wellness Visit  05/30/2020    Colonoscopy  07/02/2025   We did review patient diabetes status and need for continued diet, exercise and regular follow up with labs and A1c. I did discuss with the patient and I did review her most recent labs and her a1c is stable at 6.5%. Her lipids and her tchol was 183 and her LDL is 105. Her TSH was mildly decreased at 7.97 and I did rec that we increase her synthroid to daily. Will repeat her labs in 3 months. We did rec yearly ophthalmology exams and also regular foot exam and did review foot care. Patient will continue to monitor blood glucose and call with any issues or concerns.    Return in about 3 months (around 04/13/2020).    Algis Downs [employer] DO

## 2020-01-12 ENCOUNTER — Encounter (INDEPENDENT_AMBULATORY_CARE_PROVIDER_SITE_OTHER): Payer: Self-pay | Admitting: Family Medicine

## 2020-01-12 ENCOUNTER — Ambulatory Visit (INDEPENDENT_AMBULATORY_CARE_PROVIDER_SITE_OTHER): Payer: Medicare Other | Admitting: Family Medicine

## 2020-01-12 ENCOUNTER — Other Ambulatory Visit: Payer: Self-pay

## 2020-01-12 VITALS — BP 120/70 | HR 66 | Temp 96.0°F | Ht 66.0 in | Wt 211.0 lb

## 2020-01-12 DIAGNOSIS — G4733 Obstructive sleep apnea (adult) (pediatric): Secondary | ICD-10-CM

## 2020-01-12 DIAGNOSIS — M5416 Radiculopathy, lumbar region: Secondary | ICD-10-CM

## 2020-01-12 DIAGNOSIS — Z23 Encounter for immunization: Secondary | ICD-10-CM

## 2020-01-12 DIAGNOSIS — M25571 Pain in right ankle and joints of right foot: Secondary | ICD-10-CM

## 2020-01-12 DIAGNOSIS — E039 Hypothyroidism, unspecified: Secondary | ICD-10-CM

## 2020-01-12 DIAGNOSIS — E119 Type 2 diabetes mellitus without complications: Secondary | ICD-10-CM

## 2020-01-12 DIAGNOSIS — I10 Essential (primary) hypertension: Secondary | ICD-10-CM

## 2020-01-12 DIAGNOSIS — G8929 Other chronic pain: Secondary | ICD-10-CM

## 2020-01-12 DIAGNOSIS — E782 Mixed hyperlipidemia: Secondary | ICD-10-CM

## 2020-01-12 MED ORDER — IBUPROFEN 800 MG TABLET
ORAL_TABLET | ORAL | 3 refills | Status: DC
Start: 2020-01-12 — End: 2020-05-15

## 2020-01-12 MED ORDER — LOSARTAN 50 MG TABLET
ORAL_TABLET | ORAL | 2 refills | Status: DC
Start: 2020-01-12 — End: 2020-08-22

## 2020-01-12 MED ORDER — LEVOTHYROXINE 150 MCG TABLET
150.0000 ug | ORAL_TABLET | Freq: Every morning | ORAL | 4 refills | Status: DC
Start: 2020-01-12 — End: 2020-12-13

## 2020-01-12 NOTE — Nursing Note (Signed)
[facility] of [employer]     Patient received vaccine in clinic.  Tolerated it well, given VIS sheet and was discharged to home.  Immunization administered     Name Date Dose VIS Date Route    Influenza Vaccine, 6 month-adult 01/12/2020 0.5 mL 10/14/2019 Intramuscular    Site: Right deltoid    Given By: Theone Stanley, MA    Manufacturer: [employer]    Lot: [id number]    NDC: [id number]          Theone Stanley, MA  01/12/2020, 11:23

## 2020-01-25 ENCOUNTER — Encounter (INDEPENDENT_AMBULATORY_CARE_PROVIDER_SITE_OTHER): Payer: Self-pay | Admitting: Family Medicine

## 2020-02-05 ENCOUNTER — Other Ambulatory Visit (INDEPENDENT_AMBULATORY_CARE_PROVIDER_SITE_OTHER): Payer: Self-pay | Admitting: Family Medicine

## 2020-05-15 ENCOUNTER — Other Ambulatory Visit (INDEPENDENT_AMBULATORY_CARE_PROVIDER_SITE_OTHER): Payer: Self-pay | Admitting: Family Medicine

## 2020-05-15 NOTE — Telephone Encounter (Signed)
Last Visit:01/12/2020     Upcoming appointments: 06/13/2020       Hall Busing, RN  05/15/2020, 11:12

## 2020-06-08 ENCOUNTER — Other Ambulatory Visit (INDEPENDENT_AMBULATORY_CARE_PROVIDER_SITE_OTHER): Payer: Self-pay | Admitting: Family Medicine

## 2020-06-08 NOTE — Telephone Encounter (Signed)
LOV-01/12/2020  NOV-06/13/2020

## 2020-06-11 ENCOUNTER — Other Ambulatory Visit: Payer: Self-pay

## 2020-06-11 ENCOUNTER — Other Ambulatory Visit: Payer: Medicare PPO | Attending: Family Medicine | Admitting: Family Medicine

## 2020-06-11 ENCOUNTER — Ambulatory Visit (INDEPENDENT_AMBULATORY_CARE_PROVIDER_SITE_OTHER): Payer: Medicare PPO

## 2020-06-11 DIAGNOSIS — E119 Type 2 diabetes mellitus without complications: Secondary | ICD-10-CM | POA: Insufficient documentation

## 2020-06-11 DIAGNOSIS — I10 Essential (primary) hypertension: Secondary | ICD-10-CM

## 2020-06-11 DIAGNOSIS — E782 Mixed hyperlipidemia: Secondary | ICD-10-CM

## 2020-06-11 DIAGNOSIS — E039 Hypothyroidism, unspecified: Secondary | ICD-10-CM

## 2020-06-11 DIAGNOSIS — Z0189 Encounter for other specified special examinations: Secondary | ICD-10-CM

## 2020-06-11 LAB — CBC
HCT: 41.1 % (ref 34.8–46.0)
HGB: 13.2 g/dL (ref 11.5–16.0)
MCH: 30.3 pg (ref 26.0–32.0)
MCHC: 32.1 g/dL (ref 31.0–35.5)
MCV: 94.3 fL (ref 78.0–100.0)
MPV: 10.2 fL (ref 8.7–12.5)
PLATELETS: 201 10*3/uL (ref 150–400)
RBC: 4.36 10*6/uL (ref 3.85–5.22)
RDW-CV: 13.2 % (ref 11.5–15.5)
WBC: 5 10*3/uL (ref 3.7–11.0)

## 2020-06-11 LAB — COMPREHENSIVE METABOLIC PNL, FASTING
ALBUMIN/GLOBULIN RATIO: 2 (ref >=1.0)
ALBUMIN: 4.4 g/dL (ref 3.5–5.2)
ALKALINE PHOSPHATASE: 54 U/L (ref 41–133)
ALT (SGPT): 11 U/L (ref 0–55)
ANION GAP: 5 mmol/L — ABNORMAL LOW (ref 6–15)
AST (SGOT): 14 U/L (ref 5–34)
BILIRUBIN TOTAL: 0.6 mg/dL (ref 0.2–1.2)
BUN: 16 mg/dL (ref 7–21)
CALCIUM: 9.5 mg/dL (ref 8.0–10.6)
CHLORIDE: 107 mmol/L (ref 98–107)
CO2 TOTAL: 30 mmol/L (ref 21–32)
CREATININE: 0.69 mg/dL — ABNORMAL LOW (ref 0.80–1.60)
ESTIMATED GFR: >60 mL/min/{1.73_m2}
GLUCOSE: 154 mg/dL — ABNORMAL HIGH (ref 70–100)
POTASSIUM: 4.2 mmol/L (ref 3.3–5.1)
PROTEIN TOTAL: 6.6 g/dL (ref 6.4–8.3)
SODIUM: 142 mmol/L (ref 136–146)

## 2020-06-11 LAB — LIPID PANEL
CHOL/HDL RATIO: 4.5
CHOLESTEROL: 201 mg/dL — ABNORMAL HIGH (ref 75–200)
HDL CHOL: 45 mg/dL (ref 23–72)
LDL CALC: 116 mg/dL — ABNORMAL HIGH (ref 0–99)
TRIGLYCERIDES: 199 mg/dL — ABNORMAL HIGH (ref 28–153)
VLDL CALC: 40 mg/dL (ref 0–50)

## 2020-06-11 LAB — THYROID STIMULATING HORMONE WITH FREE T4 REFLEX: TSH: 0.135 u[IU]/mL — ABNORMAL LOW (ref 0.200–5.000)

## 2020-06-11 NOTE — Nursing Note (Signed)
Pt being seen today for BW, venus puncture in right AC, pt tolerated well

## 2020-06-12 LAB — T3 (TRIIODOTHYRONINE), FREE, SERUM: T3, FREE: 3 pg/mL (ref 2.3–4.2)

## 2020-06-12 LAB — HGA1C (HEMOGLOBIN A1C WITH EST AVG GLUCOSE): HEMOGLOBIN A1C: 6.7 % — ABNORMAL HIGH (ref 4.0–6.0)

## 2020-06-12 NOTE — Progress Notes (Signed)
OUTPATIENT PROGRESS NOTE    Subjective:   Patient ID:  Linda [name] is a pleasant 75 y.o. female.    Chief Complaint: Hypertension and Diabetes Follow up      History of Present Illness:  DM: Fasting FS range 100-120   Nonfasting FS range not checking   Hypoglycemia episodes  No   Compliant with diabetic diet Yes   Sores on feet No  Diabetes Monitors  A1C: 6.7  A1C Date: 06/11/2020           Nephropathy Screening: On ACEI or ARB    Last Lipid Panel  (Last result in the past 2 years)      Cholesterol   HDL   LDL   Direct LDL   Triglycerides      06/11/20 1034 201   45   116     199          Retinal Exam Date: Not Found  Last diabetic foot exam: Not Found  HTN: HAs No    Dizziness No   Feeling like BP is too high or low No   Compliant with taking meds Yes   Home BP:  not doing  HLD: tolerating cholosterol lowering medication     "healthy" diet  in general   Is pt fasting today? No   Hypothyroidism: Hair changes No     Skin changes No     Diarrhea or constipation  No     Heat or cold intolerance  No     Palpitations No     Compliant with taking Synthroid Yes   She has been doing ok but she has had some fatigue. She denies any palpitations and she denies any cp or sob. She has been having some increasing issues wit her balance and she has had several fall. She does get some dizziness and she just feels like she is going to fall at times. She does also have new onset of severe , sharp headaches at the right side of her head. She denies any vision changes.   The history is provided by the patient.      Allergies:   No Known Allergies      Medications:   Blood Sugar Diagnostic (CONTOUR NEXT TEST STRIPS) Strip, check BS twice a day  cholecalciferol, vitamin D3, 1,250 mcg (50,000 unit) Oral Capsule, Take 1 Cap (50,000 Units total) by mouth Every 7 days  Diclofenac Sodium 3 % Gel, Apply topically  Ibuprofen (MOTRIN) 800 mg Oral Tablet, take 1 tablet by mouth three times a day  JANUMET 50-1,000 mg Oral Tablet, take 1 tablet by  mouth twice a day with food  levothyroxine (SYNTHROID) 150 mcg Oral Tablet, Take 1 Tablet (150 mcg total) by mouth Every morning  losartan (COZAAR) 50 mg Oral Tablet, take 1 tablet by mouth once daily  meclizine (ANTIVERT) 12.5 mg Oral Tablet, Take 1 Tablet (12.5 mg total) by mouth Every 8 hours as needed for Dizziness  pravastatin (PRAVACHOL) 80 mg Oral Tablet, take 1 tablet by mouth once daily  glimepiride (AMARYL) 1 mg Oral Tablet, take 1 tablet by mouth every morning    No facility-administered medications prior to visit.        Immunization History:     Immunization History   Administered Date(s) Administered   . Influenza Vaccine, 6 month-adult 12/19/2016, 01/12/2020   . PREVNAR 13 (ADMIN) 12/19/2016         Past Medical History:     Past Medical History:  Diagnosis Date   . Chronic pain of right ankle 12/19/2016   . Essential hypertension, benign 12/17/2016   . Hypothyroidism, adult 12/17/2016   . Mixed hyperlipidemia 12/17/2016   . Obstructive sleep apnea 12/17/2016   . Type 2 diabetes mellitus without complication, without long-term current use of insulin (CMS HCC) 12/17/2016             Past Surgical History:     Past Surgical History:   Procedure Laterality Date   . HX APPENDECTOMY     . HX BACK SURGERY     . REPLACEMENT TOTAL KNEE     . SHOULDER SURGERY     . VEIN LIGATION               Family History:     Family Medical History:     Problem Relation (Age of Onset)    Leukemia Mother    Lymphoma Father              Social History:   Linda [name]  reports that she has never smoked. She has never used smokeless tobacco. She reports that she does not drink alcohol.          ROS:  Constitutional: Denies fevers, chills, night sweats. No recent weight changes . some fatigue.   Eyes: Denies change in vision. No irritation, erythema, discharge or pain.   Ears: Denies any difficulty with hearing. No ear pain, tinnitus or discharge.  Mouth/Throat: Denies any oral ulcers or other lesions. No sore throat, hoarseness  or dysphagia.  Cardiovascular: Denies any chest pain, palpitations, PND or DOE  Respiratory: Denies any shortness of breath, wheezing, cough   GI: Denies any abdominal pain, nausea, vomiting, diarrhea or constipation. No melena or hematochezia.  GU: Denies any dysuria, frequency, hematuria, hesitancy. Denies nocturia or incontinence.  Musculoskeletal: Does have the chronic right knee pain.  Neurological: No history of syncope or seizures. Denies any dizziness or headaches.  Emotional/Psychiatric: Denies any memory issues. No history of depression or anxiety.  Skin: Denies any recent rashes or lesions.         Objective:   Vitals:    Vitals:    06/13/20 1124   BP: 130/80   Pulse: 69   Temp: 35.6 C (96 F)   SpO2: 97%   Weight: 98.3 kg (216 lb 12.8 oz)   Height: 1.676 m (5\' 6" )   BMI: 35.07          Body mass index is 34.99 kg/m.      Constitutional: Alert, well developed, well nourished  HEENT:  Head: NC/AT    Eyes: Sclera anicteric, conjunctiva not injected    Ears: EAC normal, bilateral TMs clear    Nose: No discharge    Throat: MMM, posterior pharynx without erythema or exudate  Neck:   Supple with normal ROM, no cervical LAD, no thyromegaly, no JVD, no carotid bruits  Cardiovascular: RRR, normal S1/S2, no murmurs/rubs/gallops  Pulmonary:  CTAB, equal air entry, nonlabored, no wheezes/crackles/rhonchi  Abdomen:   NABS, NT/ND, soft, no HSM, no masses  Musculoskeletal:  tender at the medial joint line with pain with ROM.  Neurological:   Alert, oriented x 3, no abnormal tone  Skin:     Warm, pink, dry, no rashes, no jaundice, no pallor, no cyanosis  Psychiatric:  Normal mood, affect, behavior, judgment, and thought content        Assessment & Plan:       ICD-10-CM    1.  Type 2 diabetes mellitus without complication, without long-term current use of insulin (CMS HCC)  E11.9 HGA1C (HEMOGLOBIN A1C WITH EST AVG GLUCOSE)     HGA1C (HEMOGLOBIN A1C WITH EST AVG GLUCOSE)   2. Essential hypertension, benign  I10 CBC      COMPREHENSIVE METABOLIC PNL, FASTING     CBC     COMPREHENSIVE METABOLIC PNL, FASTING   3. Mixed hyperlipidemia  E78.2 LIPID PANEL     LIPID PANEL   4. Obstructive sleep apnea  G47.33    5. Lumbar radiculopathy  M54.16    6. Hypothyroidism, adult  E03.9 Sensitive TSH     T3, Free     T4, Free     Sensitive TSH     T3, Free     T4, Free   7. Loss of balance  R26.89 MRI BRAIN WO CONTRAST   8. New onset of headaches  R51.9 MRI BRAIN WO CONTRAST   9. Personal history of fall  Z91.81 MRI BRAIN WO CONTRAST       Orders Placed This Encounter   . MRI BRAIN WO CONTRAST   . CBC   . COMPREHENSIVE METABOLIC PNL, FASTING   . LIPID PANEL   . HGA1C (HEMOGLOBIN A1C WITH EST AVG GLUCOSE)   . Sensitive TSH   . T3, Free   . T4, Free   . CBC   . COMPREHENSIVE METABOLIC PNL, FASTING   . LIPID PANEL   . HGA1C (HEMOGLOBIN A1C WITH EST AVG GLUCOSE)   . Sensitive TSH   . T3, Free   . T4, Free                 Health Maintenance   Topic Date Due   . Diabetic Retinal Exam  Never done   . Osteoporosis screening  Never done   . Hepatitis C screening  Never done   . Covid-19 Vaccine (1) Never done   . Adult Tdap-Td (1 - Tdap) Never done   . Mammography  Never done   . Shingles Vaccine (1 of 2) Never done   . Depression Screening  05/30/2020   . Annual Wellness Visit  05/30/2020   . Diabetes A1C  12/11/2020   . Pneumococcal Vaccination, Age 72+ (1 of 1 - PPSV23) 12/19/2021   . Colonoscopy  07/02/2025   . Influenza Vaccine  Completed   We did review patient diabetes status and need for continued diet, exercise and regular follow up with labs and A1c. I did discuss with the patient and I did review her most recent labs and her a1c did increase slightly to 6.7% and her lipids did also increase slightly. I did review this and will not change her medications at present and she will improve her diet and activity and will repeat the labs in 3 months. Her TSH is mildly suppressed but T3 is normal. We did rec yearly ophthalmology exams and also regular  foot exam and did review foot care. Patient will continue to monitor blood glucose and call with any issues or concerns. I did review the loss of balance , falls and the new headaches that are severe and I did rec that she have MRI of the brain and will await results. She will call or go to er if symptoms worsen or change.    Return in about 3 months (around 09/12/2020).    Algis Downs [employer] DO

## 2020-06-13 ENCOUNTER — Other Ambulatory Visit: Payer: Self-pay

## 2020-06-13 ENCOUNTER — Encounter (INDEPENDENT_AMBULATORY_CARE_PROVIDER_SITE_OTHER): Payer: Self-pay | Admitting: Family Medicine

## 2020-06-13 ENCOUNTER — Ambulatory Visit (INDEPENDENT_AMBULATORY_CARE_PROVIDER_SITE_OTHER): Payer: Medicare PPO | Admitting: Family Medicine

## 2020-06-13 VITALS — BP 130/80 | HR 69 | Temp 96.0°F | Ht 66.0 in | Wt 216.8 lb

## 2020-06-13 DIAGNOSIS — R519 Headache, unspecified: Secondary | ICD-10-CM | POA: Insufficient documentation

## 2020-06-13 DIAGNOSIS — E119 Type 2 diabetes mellitus without complications: Secondary | ICD-10-CM

## 2020-06-13 DIAGNOSIS — E782 Mixed hyperlipidemia: Secondary | ICD-10-CM

## 2020-06-13 DIAGNOSIS — M5416 Radiculopathy, lumbar region: Secondary | ICD-10-CM

## 2020-06-13 DIAGNOSIS — R2689 Other abnormalities of gait and mobility: Secondary | ICD-10-CM | POA: Insufficient documentation

## 2020-06-13 DIAGNOSIS — G4733 Obstructive sleep apnea (adult) (pediatric): Secondary | ICD-10-CM

## 2020-06-13 DIAGNOSIS — I10 Essential (primary) hypertension: Secondary | ICD-10-CM

## 2020-06-13 DIAGNOSIS — E039 Hypothyroidism, unspecified: Secondary | ICD-10-CM

## 2020-06-13 DIAGNOSIS — Z9181 History of falling: Secondary | ICD-10-CM | POA: Insufficient documentation

## 2020-06-22 ENCOUNTER — Encounter (INDEPENDENT_AMBULATORY_CARE_PROVIDER_SITE_OTHER): Payer: Self-pay | Admitting: Family Medicine

## 2020-06-22 ENCOUNTER — Telehealth (INDEPENDENT_AMBULATORY_CARE_PROVIDER_SITE_OTHER): Payer: Self-pay | Admitting: Family Medicine

## 2020-06-22 NOTE — Telephone Encounter (Signed)
Spoke with patient about lab results  Linda [name], [place]  06/22/2020, 13:40

## 2020-07-20 ENCOUNTER — Inpatient Hospital Stay (HOSPITAL_COMMUNITY): Payer: Medicare Other

## 2020-07-20 ENCOUNTER — Emergency Department (HOSPITAL_COMMUNITY): Payer: Medicare Other

## 2020-07-20 ENCOUNTER — Inpatient Hospital Stay (HOSPITAL_COMMUNITY)
Admission: EM | Admit: 2020-07-20 | Discharge: 2020-07-22 | DRG: 065 | Disposition: A | Discharge status: Home / Self Care | Payer: Medicare Other | Attending: Neurology | Admitting: Neurology

## 2020-07-20 ENCOUNTER — Encounter (HOSPITAL_COMMUNITY): Payer: Self-pay

## 2020-07-20 DIAGNOSIS — Z9114 Patient's other noncompliance with medication regimen: Secondary | ICD-10-CM | POA: Diagnosis not present

## 2020-07-20 DIAGNOSIS — E854 Organ-limited amyloidosis: Secondary | ICD-10-CM | POA: Diagnosis present

## 2020-07-20 DIAGNOSIS — I680 Cerebral amyloid angiopathy: Secondary | ICD-10-CM | POA: Diagnosis present

## 2020-07-20 DIAGNOSIS — E785 Hyperlipidemia, unspecified: Secondary | ICD-10-CM | POA: Diagnosis present

## 2020-07-20 DIAGNOSIS — I614 Nontraumatic intracerebral hemorrhage in cerebellum: Secondary | ICD-10-CM | POA: Diagnosis not present

## 2020-07-20 DIAGNOSIS — Z20822 Contact with and (suspected) exposure to covid-19: Secondary | ICD-10-CM | POA: Diagnosis present

## 2020-07-20 DIAGNOSIS — E039 Hypothyroidism, unspecified: Secondary | ICD-10-CM | POA: Diagnosis present

## 2020-07-20 DIAGNOSIS — R569 Unspecified convulsions: Secondary | ICD-10-CM | POA: Diagnosis not present

## 2020-07-20 DIAGNOSIS — I6389 Other cerebral infarction: Secondary | ICD-10-CM | POA: Diagnosis not present

## 2020-07-20 DIAGNOSIS — I619 Nontraumatic intracerebral hemorrhage, unspecified: Secondary | ICD-10-CM | POA: Diagnosis not present

## 2020-07-20 DIAGNOSIS — I161 Hypertensive emergency: Secondary | ICD-10-CM | POA: Diagnosis present

## 2020-07-20 DIAGNOSIS — I10 Essential (primary) hypertension: Secondary | ICD-10-CM | POA: Diagnosis present

## 2020-07-20 DIAGNOSIS — E119 Type 2 diabetes mellitus without complications: Secondary | ICD-10-CM | POA: Diagnosis present

## 2020-07-20 DIAGNOSIS — G3184 Mild cognitive impairment, so stated: Secondary | ICD-10-CM | POA: Diagnosis present

## 2020-07-20 DIAGNOSIS — S06379A Contusion, laceration, and hemorrhage of cerebellum with loss of consciousness of unspecified duration, initial encounter: Secondary | ICD-10-CM

## 2020-07-20 HISTORY — DX: Hyperlipidemia, unspecified: E78.5

## 2020-07-20 HISTORY — DX: Type 2 diabetes mellitus without complications: E11.9

## 2020-07-20 HISTORY — DX: Essential (primary) hypertension: I10

## 2020-07-20 LAB — URINALYSIS, ROUTINE W REFLEX MICROSCOPIC
Bilirubin Urine: NEGATIVE
Glucose, UA: NEGATIVE mg/dL
Hgb urine dipstick: NEGATIVE
Ketones, ur: 5 mg/dL — AB
Leukocytes,Ua: NEGATIVE
Nitrite: NEGATIVE
Protein, ur: NEGATIVE mg/dL
Specific Gravity, Urine: 1.01 (ref 1.005–1.030)
pH: 6 (ref 5.0–8.0)

## 2020-07-20 LAB — COMPREHENSIVE METABOLIC PANEL
ALT: 17 U/L (ref 0–44)
AST: 24 U/L (ref 15–41)
Albumin: 4.2 g/dL (ref 3.5–5.0)
Alkaline Phosphatase: 52 U/L (ref 38–126)
Anion gap: 11 (ref 5–15)
BUN: 16 mg/dL (ref 8–23)
CO2: 20 mmol/L — ABNORMAL LOW (ref 22–32)
Calcium: 9.7 mg/dL (ref 8.9–10.3)
Chloride: 107 mmol/L (ref 98–111)
Creatinine, Ser: 0.78 mg/dL (ref 0.44–1.00)
GFR, Estimated: >60 mL/min (ref >60)
Glucose, Bld: 129 mg/dL — ABNORMAL HIGH (ref 70–99)
Potassium: 3.9 mmol/L (ref 3.5–5.1)
Sodium: 138 mmol/L (ref 135–145)
Total Bilirubin: 0.9 mg/dL (ref 0.3–1.2)
Total Protein: 7.2 g/dL (ref 6.5–8.1)

## 2020-07-20 LAB — LIPID PANEL
Cholesterol: 199 mg/dL (ref 0–200)
HDL: 48 mg/dL (ref >40)
LDL Cholesterol: 102 mg/dL — ABNORMAL HIGH (ref 0–99)
Total CHOL/HDL Ratio: 4.1 RATIO
Triglycerides: 247 mg/dL — ABNORMAL HIGH (ref <150)
VLDL: 49 mg/dL — ABNORMAL HIGH (ref 0–40)

## 2020-07-20 LAB — DIFFERENTIAL
Abs Immature Granulocytes: 0.06 10*3/uL (ref 0.00–0.07)
Basophils Absolute: 0 10*3/uL (ref 0.0–0.1)
Basophils Relative: 1 %
Eosinophils Absolute: 0 10*3/uL (ref 0.0–0.5)
Eosinophils Relative: 0 %
Immature Granulocytes: 1 %
Lymphocytes Relative: 13 %
Lymphs Abs: 1.1 10*3/uL (ref 0.7–4.0)
Monocytes Absolute: 0.5 10*3/uL (ref 0.1–1.0)
Monocytes Relative: 6 %
Neutro Abs: 6.4 10*3/uL (ref 1.7–7.7)
Neutrophils Relative %: 79 %

## 2020-07-20 LAB — I-STAT CHEM 8, ED
BUN: 17 mg/dL (ref 8–23)
Calcium, Ion: 1.14 mmol/L — ABNORMAL LOW (ref 1.15–1.40)
Chloride: 109 mmol/L (ref 98–111)
Creatinine, Ser: 0.7 mg/dL (ref 0.44–1.00)
Glucose, Bld: 130 mg/dL — ABNORMAL HIGH (ref 70–99)
HCT: 42 % (ref 36.0–46.0)
Hemoglobin: 14.3 g/dL (ref 12.0–15.0)
Potassium: 3.8 mmol/L (ref 3.5–5.1)
Sodium: 141 mmol/L (ref 135–145)
TCO2: 21 mmol/L — ABNORMAL LOW (ref 22–32)

## 2020-07-20 LAB — HEMOGLOBIN A1C
Hgb A1c MFr Bld: 6.7 % — ABNORMAL HIGH (ref 4.8–5.6)
Mean Plasma Glucose: 145.59 mg/dL

## 2020-07-20 LAB — PROTIME-INR
INR: 0.9 (ref 0.8–1.2)
Prothrombin Time: 12.6 seconds (ref 11.4–15.2)

## 2020-07-20 LAB — APTT: aPTT: 26 seconds (ref 24–36)

## 2020-07-20 LAB — CBC
HCT: 43.5 % (ref 36.0–46.0)
Hemoglobin: 14.3 g/dL (ref 12.0–15.0)
MCH: 31 pg (ref 26.0–34.0)
MCHC: 32.9 g/dL (ref 30.0–36.0)
MCV: 94.2 fL (ref 80.0–100.0)
Platelets: 166 10*3/uL (ref 150–400)
RBC: 4.62 MIL/uL (ref 3.87–5.11)
RDW: 13.1 % (ref 11.5–15.5)
WBC: 8 10*3/uL (ref 4.0–10.5)
nRBC: 0 % (ref 0.0–0.2)

## 2020-07-20 LAB — RESP PANEL BY RT-PCR (FLU A&B, COVID) ARPGX2
Influenza A by PCR: NEGATIVE
Influenza B by PCR: NEGATIVE
SARS Coronavirus 2 by RT PCR: NEGATIVE

## 2020-07-20 LAB — RAPID URINE DRUG SCREEN, HOSP PERFORMED
Amphetamines: NOT DETECTED
Barbiturates: NOT DETECTED
Benzodiazepines: NOT DETECTED
Cocaine: NOT DETECTED
Opiates: NOT DETECTED
Tetrahydrocannabinol: NOT DETECTED

## 2020-07-20 LAB — ETHANOL: Alcohol, Ethyl (B): <10 mg/dL (ref <10)

## 2020-07-20 LAB — GLUCOSE, CAPILLARY: Glucose-Capillary: 134 mg/dL — ABNORMAL HIGH (ref 70–99)

## 2020-07-20 LAB — CBG MONITORING, ED: Glucose-Capillary: 149 mg/dL — ABNORMAL HIGH (ref 70–99)

## 2020-07-20 LAB — MRSA PCR SCREENING: MRSA by PCR: NEGATIVE
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
## 2020-07-20 IMAGING — MR MR MRA HEAD W/O CM
2 series · 17 of 48 positions shown · non-contrast
Comparison: No pertinent prior exam.
COMPARISON: Prior head CT from earlier the same day.

CLINICAL DATA: Initial evaluation for acute intracranial
hemorrhage, seizure.

EXAM:
MRI HEAD WITHOUT CONTRAST
MRA HEAD WITHOUT CONTRAST
TECHNIQUE: Multiplanar, multi-echo pulse sequences of the brain and surrounding
structures were acquired without intravenous contrast. Angiographic
images of the Circle of Willis were acquired using MRA technique
without intravenous contrast.

[Series 4: ax (id) · axial · 1.0mm · 0.43mm/px · z∈[-122,-39]mm · 16 of 176 slices shown]
[im 1/176]
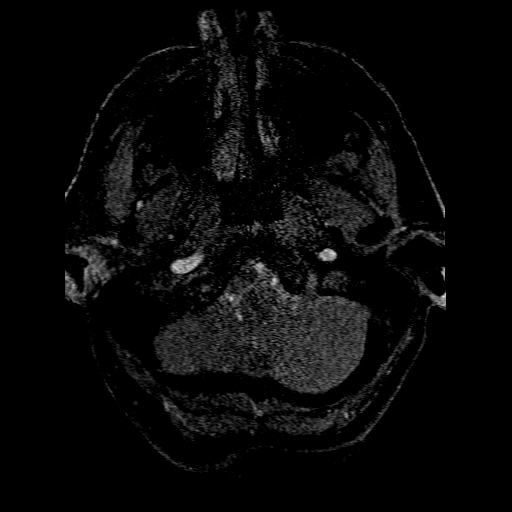
[im 4/176]
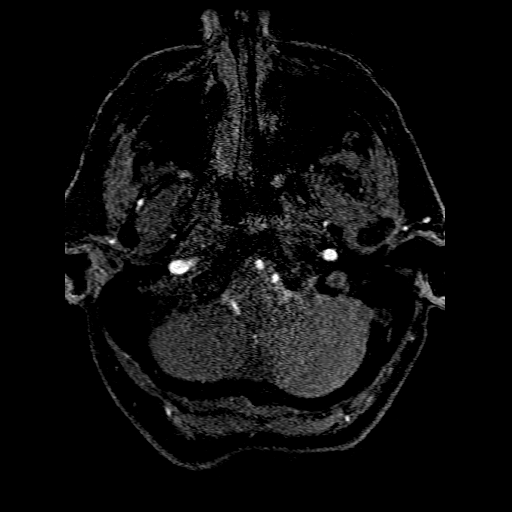
[im 8/176]
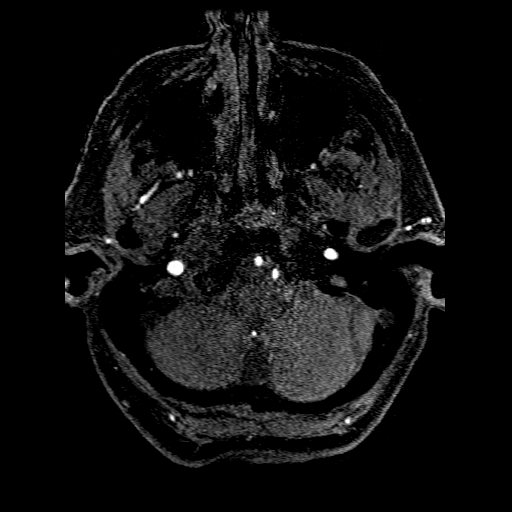
[im 12/176]
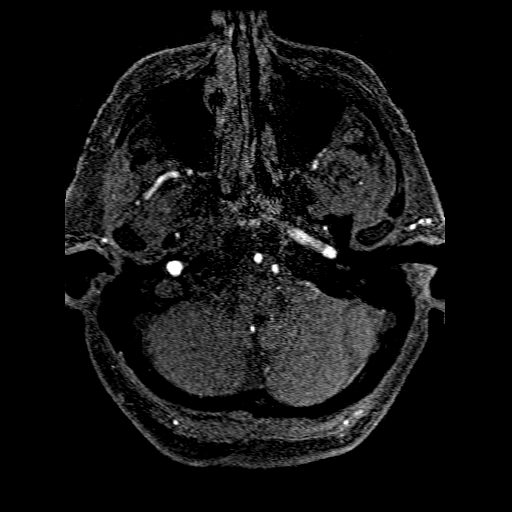
[im 16/176]
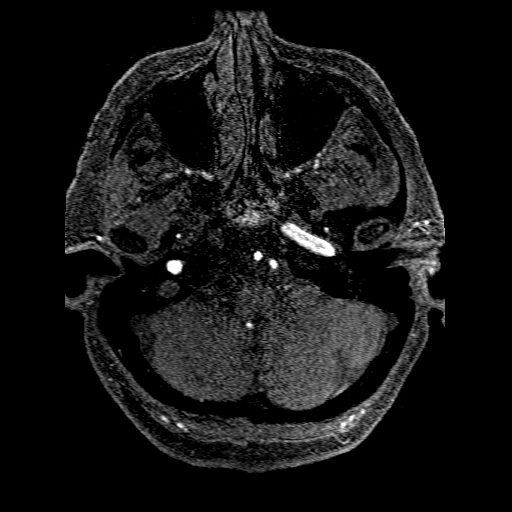
[im 20/176]
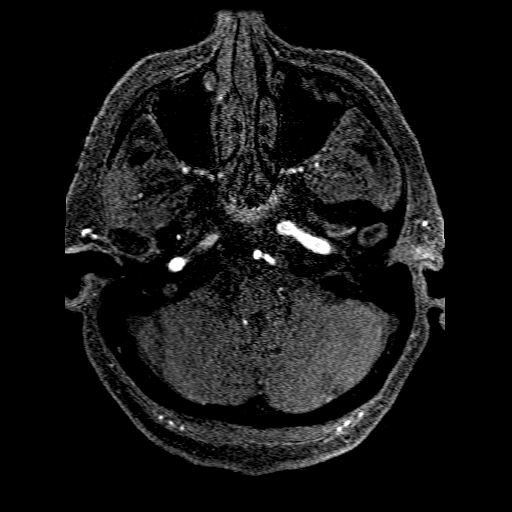
[im 27/176]
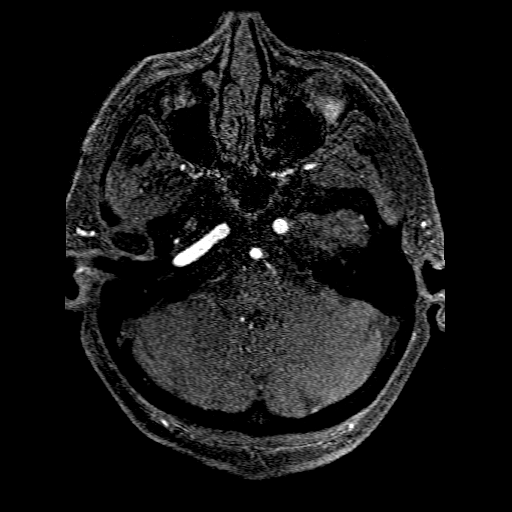
[im 31/176]
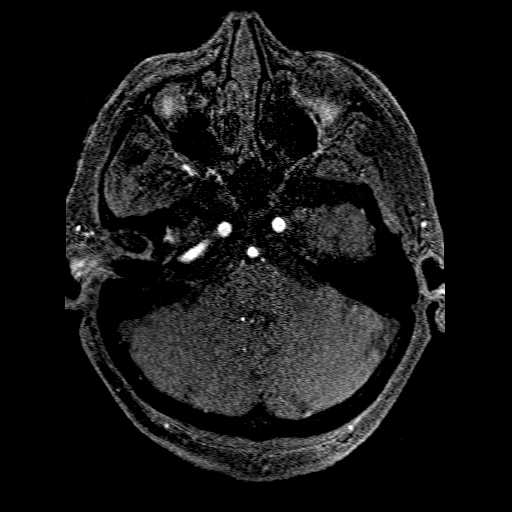
[im 54/176]
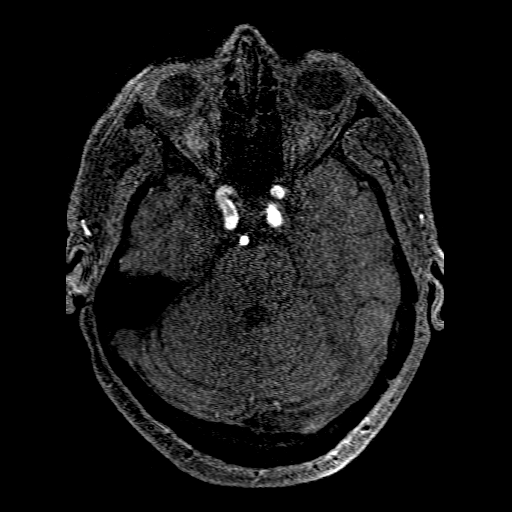
[im 77/176]
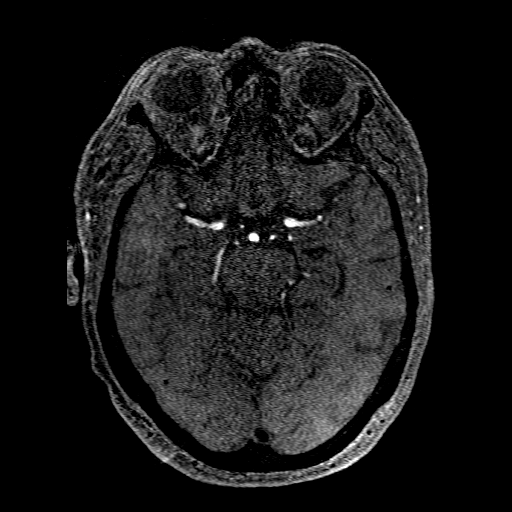
[im 88/176]
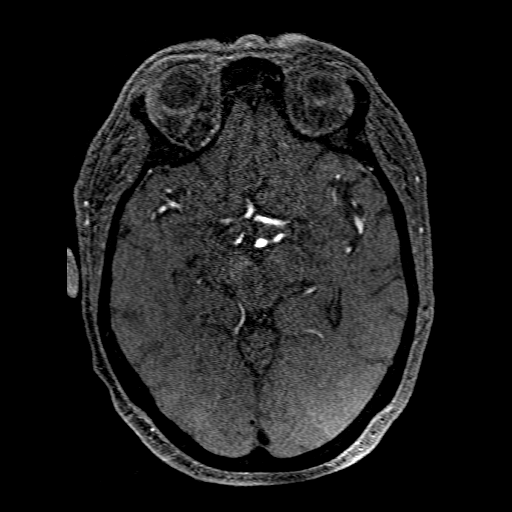
[im 99/176]
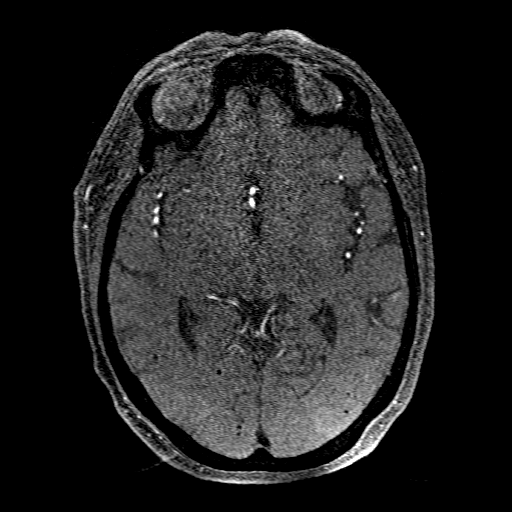
[im 122/176]
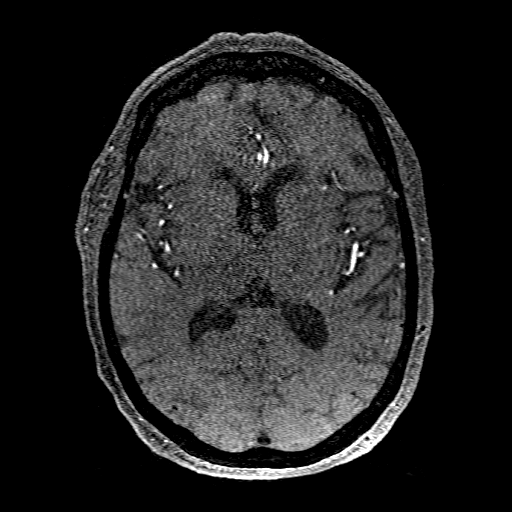
[im 145/176]
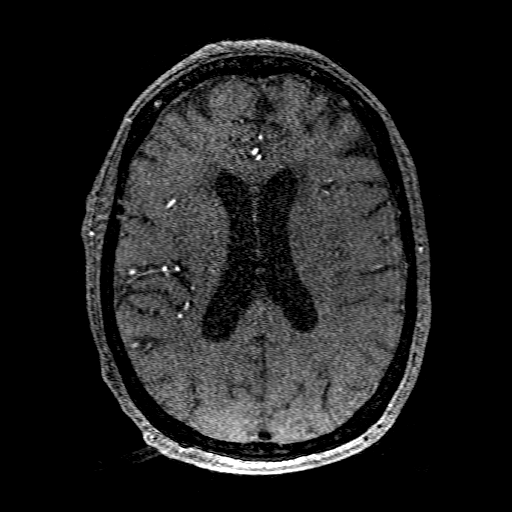
[im 149/176]
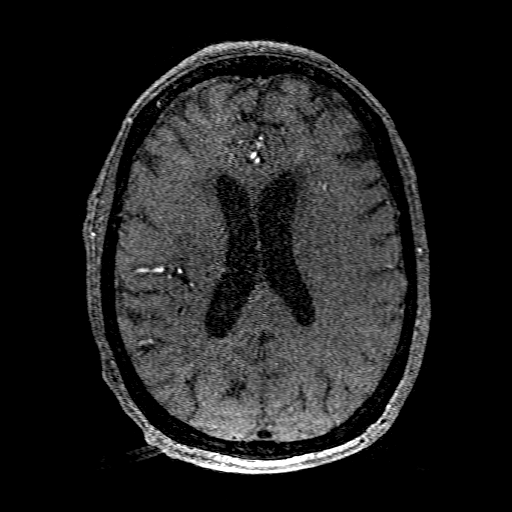
[im 168/176]
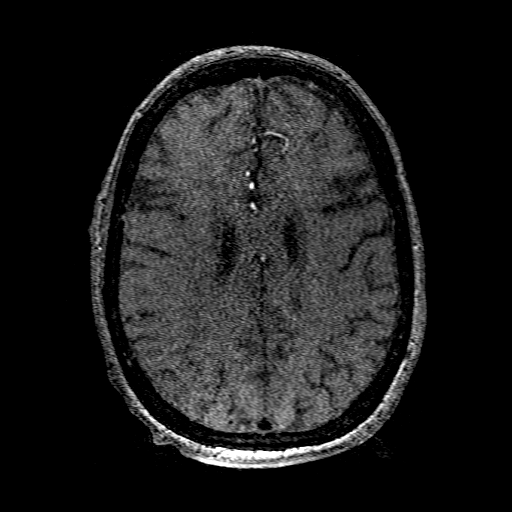

[Series 401: pjn:ax (id) · sagittal · 1.0mm · 0.43mm/px · 1 of 3 slices shown]
[im 1/3]
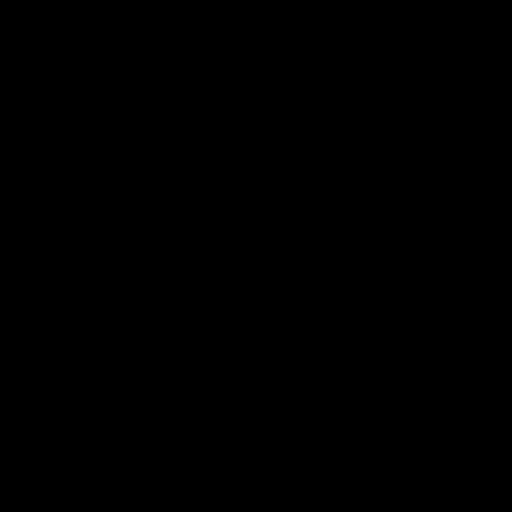

[17 of 48 positions shown; findings below may reference images not displayed]

FINDINGS: MRI HEAD FINDINGS

Brain: Examination moderately degraded by motion artifact.

Cerebral volume within normal limits for age. Patchy T2/FLAIR
hyperintensity within the periventricular deep white matter both
cerebral hemispheres most consistent with chronic small vessel
ischemic disease, mild-to-moderate in appearance. Probable patchy
involvement of the pons.

Previously identified small intraparenchymal hemorrhage positioned
at the right cerebellum again seen, similar measuring 8 mm on this
exam (series 7, image 7). Minimal surrounding edema without
significant regional mass effect. No intraventricular extension into
the adjacent fourth ventricle. No other visible acute intracranial
hemorrhage on this motion degraded exam. Innumerable chronic micro
hemorrhages seen throughout the bilateral parieto-occipital region,
with a few additional foci involving both frontal lobes and thalami.
Distribution favors chronic poorly controlled hypertension.

No other evidence for acute or subacute infarct. Gray-white matter
differentiation otherwise maintained. No mass lesion, mass effect or
midline shift. No hydrocephalus or extra-axial fluid collection.
Pituitary gland suprasellar region normal. Midline structures
intact.

Vascular: Major intracranial vascular flow voids are maintained.

Skull and upper cervical spine: Craniocervical junction within
normal limits. Bone marrow signal intensity normal. No scalp soft
tissue abnormality.

Sinuses/Orbits: Globes and orbital soft tissues within normal
limits. Paranasal sinuses are largely clear. No mastoid effusion.
Inner ear structures grossly normal.

Other: None.

MRA HEAD FINDINGS

ANTERIOR CIRCULATION:

Examination degraded by motion artifact.

Visualized distal cervical segments of the internal carotid arteries
are patent with antegrade flow. Petrous, cavernous, and supraclinoid
segments patent without stenosis or other abnormality. A1 segments
widely patent. Normal anterior communicating artery complex.
Anterior cerebral arteries patent to their distal aspects without
stenosis. No M1 stenosis or occlusion. Normal MCA bifurcations.
Distal MCA branches well perfused and symmetric. Probable distal
mild small vessel atheromatous irregularity.

POSTERIOR CIRCULATION:

Visualized distal V4 segments patent without stenosis. Neither PICA
origin well visualized on this exam. Basilar patent to its distal
aspect without stenosis. Superior cerebellar arteries patent
proximally. Both PCAs primarily supplied via the basilar. Small
right posterior communicating artery noted as well. PCAs well
perfused to their distal aspects without stenosis.

No intracranial aneurysm. No vascular abnormality seen underlying
the right cerebellar hemorrhage.
IMPRESSION: MRI HEAD IMPRESSION:

1. 8 mm right cerebellar intraparenchymal hemorrhage with minimal
surrounding edema. No significant regional mass effect.
2. Innumerable chronic micro hemorrhages involving the bilateral
parieto-occipital region, with more mild involvement about the
frontal lobes and thalami. Findings felt to be most consistent with
chronic poorly controlled hypertension.
3. Mild-to-moderate chronic microvascular ischemic disease.

MRA HEAD IMPRESSION:

1. Negative intracranial MRA with no large vessel occlusion or
hemodynamically significant stenosis.
2. No vascular abnormality seen underlying the right cerebellar
hemorrhage.
3. Distal small vessel atheromatous irregularity.

## 2020-07-20 IMAGING — MR MR HEAD W/O CM
7 of 14 series · 18 of 48 positions shown · non-contrast
Comparison: No pertinent prior exam.
COMPARISON: Prior head CT from earlier the same day.

CLINICAL DATA: Initial evaluation for acute intracranial
hemorrhage, seizure.

EXAM:
MRI HEAD WITHOUT CONTRAST
MRA HEAD WITHOUT CONTRAST
TECHNIQUE: Multiplanar, multi-echo pulse sequences of the brain and surrounding
structures were acquired without intravenous contrast. Angiographic
images of the Circle of Willis were acquired using MRA technique
without intravenous contrast.

[Series 3: DWI · axial · 3.0mm · 0.94mm/px · z∈[-148,-3]mm · 5 of 100 slices shown (1 of 2)]
[im 1/100]
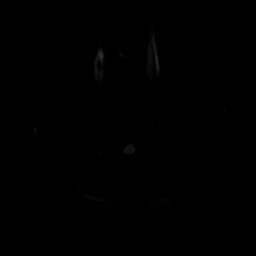
[im 25/100]
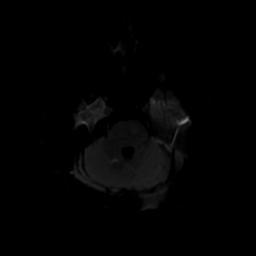
[im 50/100]
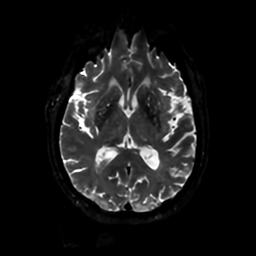
[im 75/100]
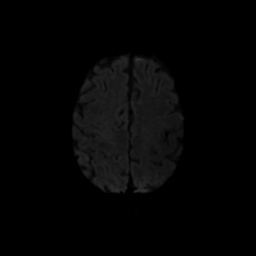
[im 100/100]
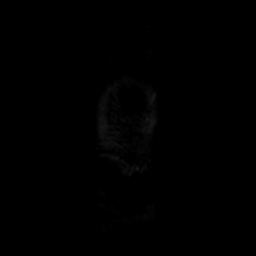

[Series 5: DWI · coronal · 4.0mm · 0.94mm/px · 4 of 73 slices shown (2 of 2)]
[im 1/73]
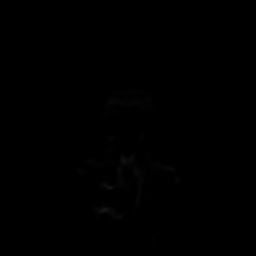
[im 25/73]
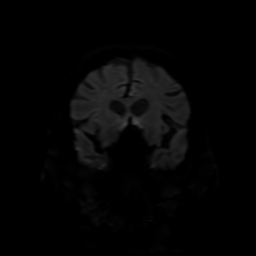
[im 49/73]
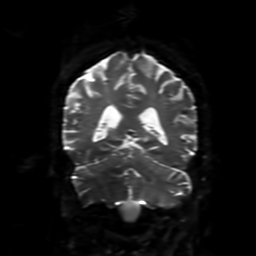
[im 73/73]
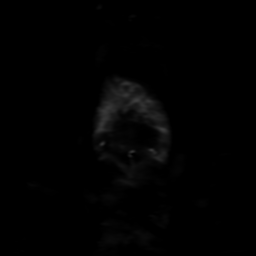

[Series 6: FLAIR · sagittal · 5.0mm · 0.23mm/px · 1 of 23 slices shown (1 of 2)]
[im 1/23]
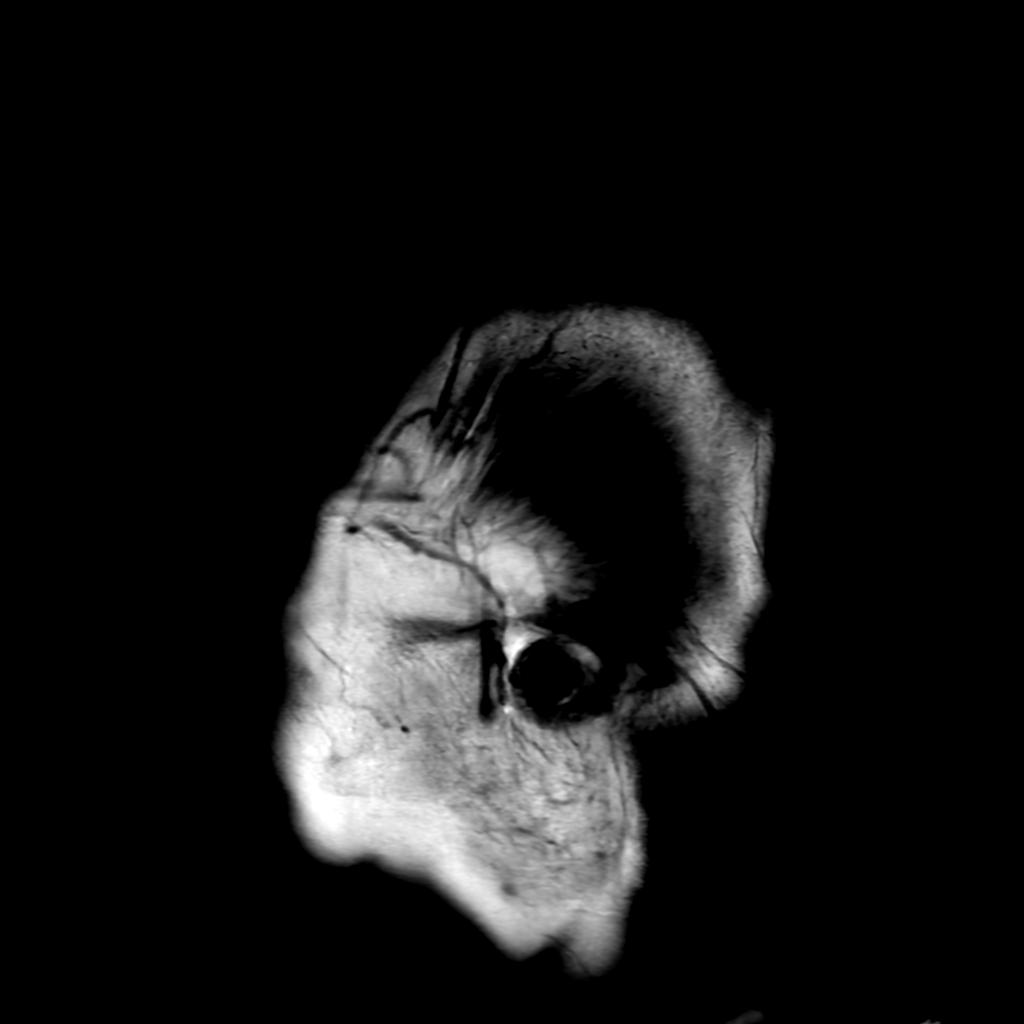

[Series 7: T2 · axial · 5.0mm · 0.23mm/px · z∈[-159,-12]mm · 2 of 26 slices shown]
[im 1/26]
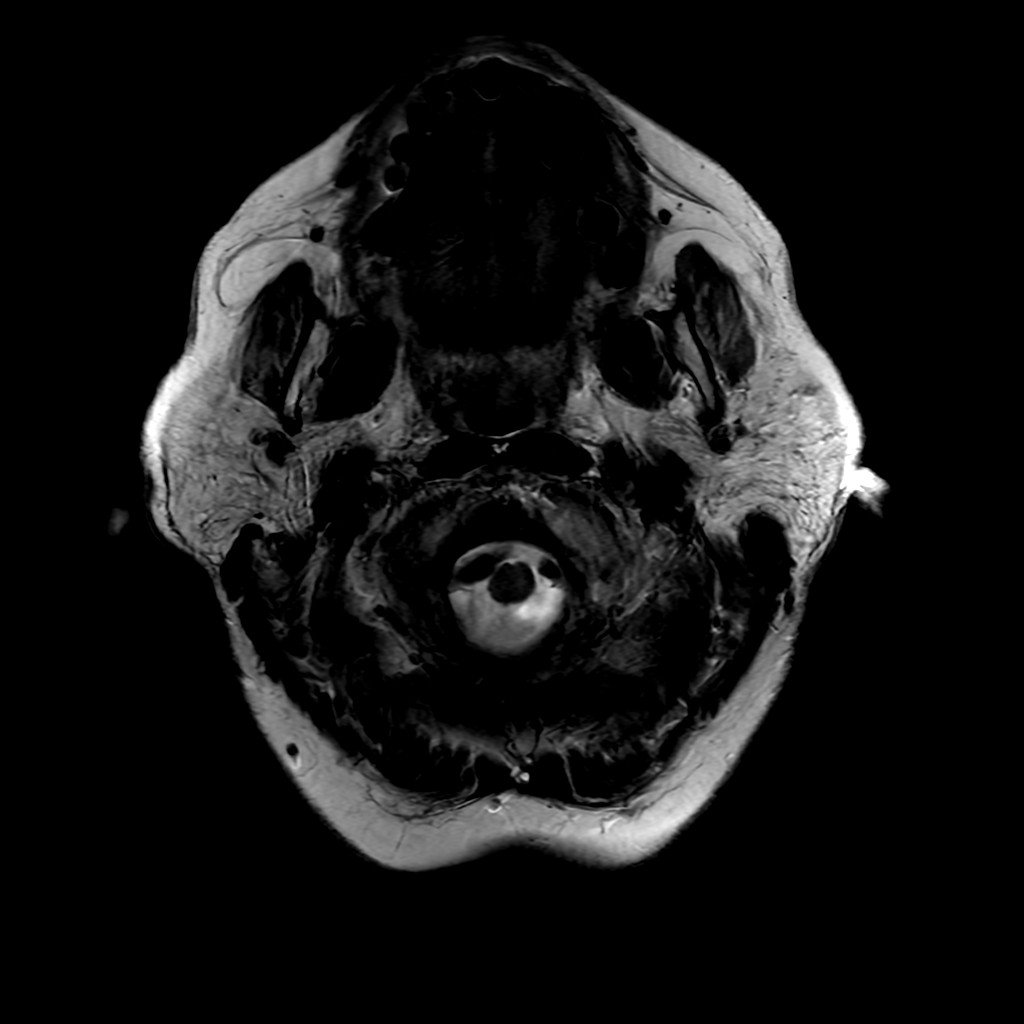
[im 26/26]
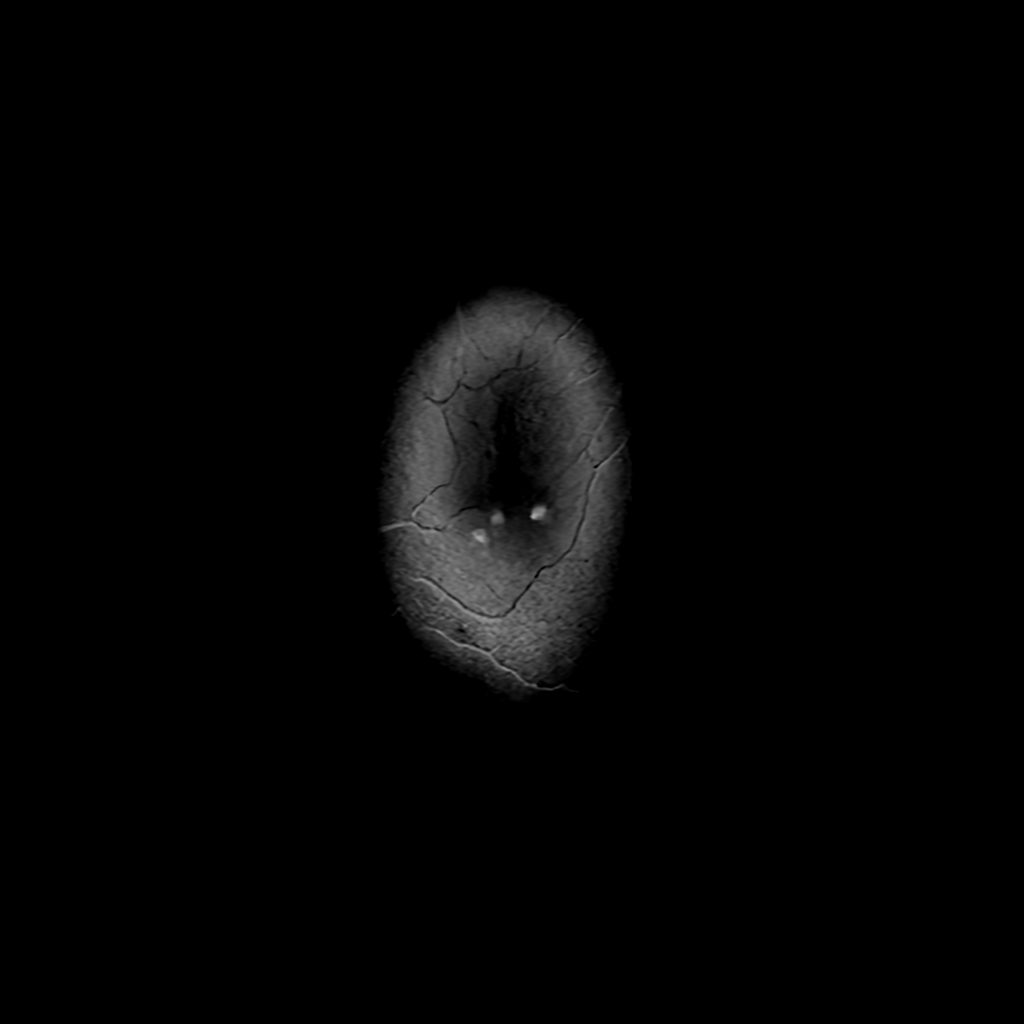

[Series 8: FLAIR · axial · 3.0mm · 0.45mm/px · 1 of 24 slices shown (2 of 2)]
[im 1/24]
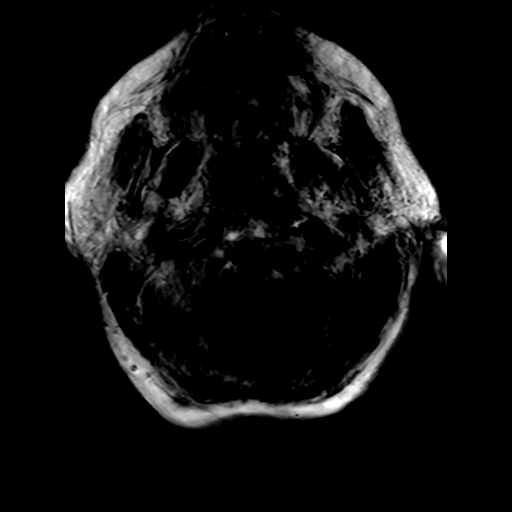

[Series 350: ADC · axial · 3.0mm · 0.94mm/px · z∈[-148,-3]mm · 3 of 50 slices shown (1 of 2)]
[im 1/50]
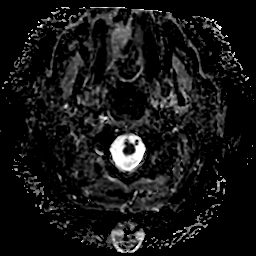
[im 25/50]
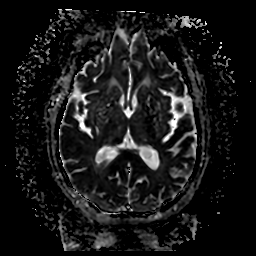
[im 50/50]
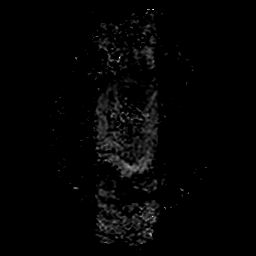

[Series 550: ADC · coronal · 4.0mm · 0.94mm/px · 2 of 36 slices shown (2 of 2)]
[im 1/36]
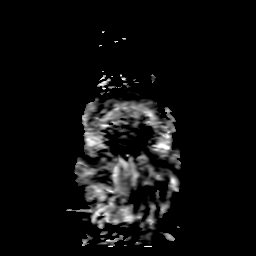
[im 36/36]
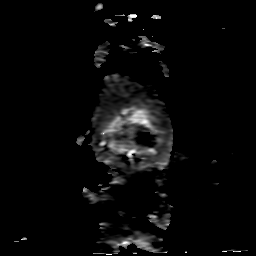

[18 of 48 positions shown; findings below may reference images not displayed]

FINDINGS: MRI HEAD FINDINGS

Brain: Examination moderately degraded by motion artifact.

Cerebral volume within normal limits for age. Patchy T2/FLAIR
hyperintensity within the periventricular deep white matter both
cerebral hemispheres most consistent with chronic small vessel
ischemic disease, mild-to-moderate in appearance. Probable patchy
involvement of the pons.

Previously identified small intraparenchymal hemorrhage positioned
at the right cerebellum again seen, similar measuring 8 mm on this
exam (series 7, image 7). Minimal surrounding edema without
significant regional mass effect. No intraventricular extension into
the adjacent fourth ventricle. No other visible acute intracranial
hemorrhage on this motion degraded exam. Innumerable chronic micro
hemorrhages seen throughout the bilateral parieto-occipital region,
with a few additional foci involving both frontal lobes and thalami.
Distribution favors chronic poorly controlled hypertension.

No other evidence for acute or subacute infarct. Gray-white matter
differentiation otherwise maintained. No mass lesion, mass effect or
midline shift. No hydrocephalus or extra-axial fluid collection.
Pituitary gland suprasellar region normal. Midline structures
intact.

Vascular: Major intracranial vascular flow voids are maintained.

Skull and upper cervical spine: Craniocervical junction within
normal limits. Bone marrow signal intensity normal. No scalp soft
tissue abnormality.

Sinuses/Orbits: Globes and orbital soft tissues within normal
limits. Paranasal sinuses are largely clear. No mastoid effusion.
Inner ear structures grossly normal.

Other: None.

MRA HEAD FINDINGS

ANTERIOR CIRCULATION:

Examination degraded by motion artifact.

Visualized distal cervical segments of the internal carotid arteries
are patent with antegrade flow. Petrous, cavernous, and supraclinoid
segments patent without stenosis or other abnormality. A1 segments
widely patent. Normal anterior communicating artery complex.
Anterior cerebral arteries patent to their distal aspects without
stenosis. No M1 stenosis or occlusion. Normal MCA bifurcations.
Distal MCA branches well perfused and symmetric. Probable distal
mild small vessel atheromatous irregularity.

POSTERIOR CIRCULATION:

Visualized distal V4 segments patent without stenosis. Neither PICA
origin well visualized on this exam. Basilar patent to its distal
aspect without stenosis. Superior cerebellar arteries patent
proximally. Both PCAs primarily supplied via the basilar. Small
right posterior communicating artery noted as well. PCAs well
perfused to their distal aspects without stenosis.

No intracranial aneurysm. No vascular abnormality seen underlying
the right cerebellar hemorrhage.
IMPRESSION: MRI HEAD IMPRESSION:

1. 8 mm right cerebellar intraparenchymal hemorrhage with minimal
surrounding edema. No significant regional mass effect.
2. Innumerable chronic micro hemorrhages involving the bilateral
parieto-occipital region, with more mild involvement about the
frontal lobes and thalami. Findings felt to be most consistent with
chronic poorly controlled hypertension.
3. Mild-to-moderate chronic microvascular ischemic disease.

MRA HEAD IMPRESSION:

1. Negative intracranial MRA with no large vessel occlusion or
hemodynamically significant stenosis.
2. No vascular abnormality seen underlying the right cerebellar
hemorrhage.
3. Distal small vessel atheromatous irregularity.

## 2020-07-20 MED ORDER — SODIUM CHLORIDE 0.9 % IV SOLN: INTRAVENOUS | Status: DC

## 2020-07-20 MED ORDER — STROKE: EARLY STAGES OF RECOVERY BOOK
Freq: Once | Status: AC
  Administered 2020-07-20: 1
  Filled 2020-07-20 (×2): qty 1

## 2020-07-20 MED ORDER — CHLORHEXIDINE GLUCONATE CLOTH 2 % EX PADS
6.0000 | MEDICATED_PAD | Freq: Every day | CUTANEOUS | Status: DC
  Administered 2020-07-20: 6 via TOPICAL

## 2020-07-20 MED ORDER — ACETAMINOPHEN 160 MG/5ML PO SOLN: 650.0000 mg | ORAL | Status: DC | PRN

## 2020-07-20 MED ORDER — LEVETIRACETAM IN NACL 1000 MG/100ML IV SOLN
1000.0000 mg | Freq: Once | INTRAVENOUS | Status: AC
  Administered 2020-07-20: 1000 mg via INTRAVENOUS
  Filled 2020-07-20: qty 100

## 2020-07-20 MED ORDER — MAGNESIUM OXIDE -MG SUPPLEMENT 400 (240 MG) MG PO TABS
400.0000 mg | ORAL_TABLET | Freq: Every day | ORAL | Status: DC
  Administered 2020-07-20 – 2020-07-22 (×3): 400 mg via ORAL
  Filled 2020-07-20 (×3): qty 1

## 2020-07-20 MED ORDER — ACETAMINOPHEN 325 MG PO TABS
650.0000 mg | ORAL_TABLET | ORAL | Status: DC | PRN
  Administered 2020-07-20 – 2020-07-21 (×2): 650 mg via ORAL
  Filled 2020-07-20 (×2): qty 2

## 2020-07-20 MED ORDER — LABETALOL HCL 5 MG/ML IV SOLN: 5.0000 mg | Freq: Once | INTRAVENOUS | Status: AC

## 2020-07-20 MED ORDER — MECLIZINE HCL 25 MG PO TABS
12.5000 mg | ORAL_TABLET | ORAL | Status: DC | PRN
  Filled 2020-07-20: qty 0.5

## 2020-07-20 MED ORDER — PRAVASTATIN SODIUM 40 MG PO TABS
80.0000 mg | ORAL_TABLET | Freq: Every day | ORAL | Status: DC
  Administered 2020-07-21 – 2020-07-22 (×2): 80 mg via ORAL
  Filled 2020-07-20 (×2): qty 2

## 2020-07-20 MED ORDER — CLEVIDIPINE BUTYRATE 0.5 MG/ML IV EMUL
0.0000 mg/h | INTRAVENOUS | Status: DC
  Administered 2020-07-20 – 2020-07-21 (×2): 9 mg/h via INTRAVENOUS
  Administered 2020-07-21: 6 mg/h via INTRAVENOUS
  Administered 2020-07-21: 7 mg/h via INTRAVENOUS
  Administered 2020-07-21: 12 mg/h via INTRAVENOUS
  Administered 2020-07-21: 11 mg/h via INTRAVENOUS
  Administered 2020-07-21: 10 mg/h via INTRAVENOUS
  Administered 2020-07-21: 7 mg/h via INTRAVENOUS
  Filled 2020-07-20 (×5): qty 50
  Filled 2020-07-20: qty 100
  Filled 2020-07-20 (×2): qty 50

## 2020-07-20 MED ORDER — SENNOSIDES-DOCUSATE SODIUM 8.6-50 MG PO TABS
1.0000 | ORAL_TABLET | Freq: Two times a day (BID) | ORAL | Status: DC
  Administered 2020-07-20 – 2020-07-22 (×4): 1 via ORAL
  Filled 2020-07-20 (×4): qty 1

## 2020-07-20 MED ORDER — MAGNESIUM OXIDE 400 MG PO CAPS: 400.0000 mg | ORAL_CAPSULE | Freq: Every day | ORAL | Status: DC

## 2020-07-20 MED ORDER — ACETAMINOPHEN 650 MG RE SUPP: 650.0000 mg | RECTAL | Status: DC | PRN

## 2020-07-20 MED ORDER — INSULIN ASPART 100 UNIT/ML IJ SOLN
0.0000 [IU] | Freq: Three times a day (TID) | INTRAMUSCULAR | Status: DC
Start: 2020-07-21 — End: 2020-07-22
  Administered 2020-07-21 – 2020-07-22 (×4): 2 [IU] via SUBCUTANEOUS

## 2020-07-20 MED ORDER — PANTOPRAZOLE SODIUM 40 MG IV SOLR
40.0000 mg | Freq: Every day | INTRAVENOUS | Status: DC
  Administered 2020-07-20: 40 mg via INTRAVENOUS
  Filled 2020-07-20: qty 40

## 2020-07-20 MED ORDER — LABETALOL HCL 5 MG/ML IV SOLN
INTRAVENOUS | Status: AC
  Administered 2020-07-20: 5 mg via INTRAVENOUS
  Filled 2020-07-20: qty 4

## 2020-07-20 MED ORDER — LABETALOL HCL 5 MG/ML IV SOLN
10.0000 mg | Freq: Once | INTRAVENOUS | Status: AC
  Administered 2020-07-20: 10 mg via INTRAVENOUS

## 2020-07-20 MED ORDER — DIPHENHYDRAMINE HCL 50 MG/ML IJ SOLN
25.0000 mg | Freq: Once | INTRAMUSCULAR | Status: DC
  Filled 2020-07-20: qty 1

## 2020-07-20 MED ORDER — CLEVIDIPINE BUTYRATE 0.5 MG/ML IV EMUL
INTRAVENOUS | Status: AC
  Administered 2020-07-20: 25 mg
  Filled 2020-07-20: qty 50

## 2020-07-20 MED ORDER — LEVOTHYROXINE SODIUM 75 MCG PO TABS
150.0000 ug | ORAL_TABLET | Freq: Every morning | ORAL | Status: DC
  Administered 2020-07-21 – 2020-07-22 (×2): 150 ug via ORAL
  Filled 2020-07-20 (×2): qty 2
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
## 2020-07-20 NOTE — ED Notes (Signed)
[facility] called to activate Stroke at 1550//Raahil Ong, Ivette Loyal

## 2020-07-20 NOTE — ED Notes (Signed)
Chatted with neuro MD Re pt MRI.  MD stated pt does not need to have MRI emergently and that pt could go up to 4N and have MRI rescheduled for later in the night.

## 2020-07-20 NOTE — ED Triage Notes (Signed)
Pt came in via [facility]. Pt was in the [place]. Pt started having a seizure lasted for about 2 mins. Pt had no history of seizure. Pt does not remember the event as to what happened. Pt is  A&O to place and time.   CBG 132  BP 232 palpated.

## 2020-07-21 ENCOUNTER — Other Ambulatory Visit: Payer: Self-pay

## 2020-07-21 ENCOUNTER — Inpatient Hospital Stay (HOSPITAL_COMMUNITY): Payer: Medicare Other

## 2020-07-21 ENCOUNTER — Ambulatory Visit (HOSPITAL_COMMUNITY): Payer: Medicare Other

## 2020-07-21 DIAGNOSIS — S06379A Contusion, laceration, and hemorrhage of cerebellum with loss of consciousness of unspecified duration, initial encounter: Secondary | ICD-10-CM

## 2020-07-21 DIAGNOSIS — I6389 Other cerebral infarction: Secondary | ICD-10-CM | POA: Diagnosis not present

## 2020-07-21 LAB — ECHOCARDIOGRAM COMPLETE
AR max vel: 2.08 cm2
AV Area VTI: 2.28 cm2
AV Area mean vel: 2.05 cm2
AV Mean grad: 6 mmHg
AV Peak grad: 11 mmHg
Ao pk vel: 1.66 m/s
Area-P 1/2: 2.68 cm2
S' Lateral: 2.8 cm

## 2020-07-21 LAB — GLUCOSE, CAPILLARY
Glucose-Capillary: 149 mg/dL — ABNORMAL HIGH (ref 70–99)
Glucose-Capillary: 134 mg/dL — ABNORMAL HIGH (ref 70–99)
Glucose-Capillary: 123 mg/dL — ABNORMAL HIGH (ref 70–99)
Glucose-Capillary: 122 mg/dL — ABNORMAL HIGH (ref 70–99)

## 2020-07-21 MED ORDER — LOSARTAN POTASSIUM 50 MG PO TABS
50.0000 mg | ORAL_TABLET | Freq: Two times a day (BID) | ORAL | Status: DC
  Administered 2020-07-21 – 2020-07-22 (×2): 50 mg via ORAL
  Filled 2020-07-21 (×2): qty 1

## 2020-07-21 MED ORDER — AMLODIPINE BESYLATE 10 MG PO TABS
10.0000 mg | ORAL_TABLET | Freq: Every day | ORAL | Status: DC
  Administered 2020-07-21 – 2020-07-22 (×2): 10 mg via ORAL
  Filled 2020-07-21 (×2): qty 1

## 2020-07-21 MED ORDER — LEVETIRACETAM 500 MG PO TABS
500.0000 mg | ORAL_TABLET | Freq: Two times a day (BID) | ORAL | Status: DC
  Administered 2020-07-21 – 2020-07-22 (×3): 500 mg via ORAL
  Filled 2020-07-21 (×3): qty 1

## 2020-07-21 MED ORDER — PANTOPRAZOLE SODIUM 40 MG PO TBEC
40.0000 mg | DELAYED_RELEASE_TABLET | Freq: Every evening | ORAL | Status: DC
  Administered 2020-07-21: 40 mg via ORAL
  Filled 2020-07-21: qty 1

## 2020-07-21 MED ORDER — LOSARTAN POTASSIUM 50 MG PO TABS
50.0000 mg | ORAL_TABLET | Freq: Every day | ORAL | Status: DC
  Administered 2020-07-21: 50 mg via ORAL
  Filled 2020-07-21: qty 1

## 2020-07-21 NOTE — Progress Notes (Signed)
OT Cancellation Note  Patient Details Name: Anthonia Monger MRN: [id number] DOB: [birth year]   Cancelled Treatment:    Reason Eval/Treat Not Completed: Active bedrest order. Will return as schedule allows.  Jennafer Gladue M Anedra Penafiel Bronco Mcgrory MSOT, OTR/L Acute Rehab Pager: [phone] Office: [phone] 07/21/2020, 6:27 AM

## 2020-07-21 NOTE — Evaluation (Signed)
Occupational Therapy Evaluation Patient Details Name: Emma Adams MRN: [id number] DOB: [birth year] Today's Date: 07/21/2020    History of Present Illness 75 y.o. female with PMH significant for HTN, HLD, DM2, hypothyroidism who is non compliant with medications who comes in to the ED 07/20/20 with 1 min seizure in the car. Pt passenger in car as traveling through [place] back to [place]. 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 MRi pending   Clinical Impression   PTA, pt was living with her husband and was independent; reports several falls as she easily looses her balance and has vertigo. Pt currently requiring Min A for LB ADLs and functional transfers. Pt presenting with decreased balance, cognition, and activity tolerance. Limited to stand pivot only to recliner for OOB as pt BP restrictions <140 and BP at 149/70 with activity. Notified RN. Pt would benefit from further acute OT to facilitate safe dc. Recommend dc to home with HHOT for further OT to optimize safety, independence with ADLs, and return to PLOF.      Follow Up Recommendations  Home health OT;Supervision/Assistance - 24 hour    Equipment Recommendations  3 in 1 bedside commode (For in shower)    Recommendations for Other Services PT consult     Precautions / Restrictions Precautions Precautions: Fall Precaution Comments: frequent LOB and falls PTA; h/o vertigo      Mobility Bed Mobility Overal bed mobility: Needs Assistance Bed Mobility: Supine to Sit     Supine to sit: Supervision     General bed mobility comments: for lines/safety due to dizziness    Transfers Overall transfer level: Needs assistance Equipment used: 1 person hand held assist Transfers: Sit to/from Stand;Stand Pivot Transfers Sit to Stand: Min assist Stand pivot transfers: Min assist       General transfer comment: assist for posterior imbalance on initial standing; reported +dizziness    Balance  Overall balance assessment: Needs assistance Sitting-balance support: No upper extremity supported;Feet supported Sitting balance-Leahy Scale: Good Sitting balance - Comments: donning socks with crossing legs at EOB   Standing balance support: Single extremity supported Standing balance-Leahy Scale: Poor Standing balance comment: posterior bias in standing                           ADL either performed or assessed with clinical judgement   ADL Overall ADL's : Needs assistance/impaired Eating/Feeding: Set up;Sitting;Supervision/ safety   Grooming: Wash/dry face;Set up;Supervision/safety;Sitting   Upper Body Bathing: Minimal assistance;Sitting   Lower Body Bathing: Minimal assistance;Sit to/from stand   Upper Body Dressing : Minimal assistance;Sitting   Lower Body Dressing: Minimal assistance;Sit to/from stand Lower Body Dressing Details (indicate cue type and reason): Pt donning her socks while seated at EOB with Min Guard A. Min A for sit<>stand with posterior lean Toilet Transfer: Minimal assistance;Stand-pivot (simulated to recliner)         Tub/Shower Transfer Details (indicate cue type and reason): Educating pt and family on use of 3n1 as shower seat. Also addressing adaptation to reduce water on floor Functional mobility during ADLs: Minimal assistance (stand pivot) General ADL Comments: Pt limited by BP. Requiring Min A for standing due to posterior lean. Pt presenting with decreased cognition, balance, and activity tolerance. Very motivated     Vision Baseline Vision/History: No visual deficits Patient Visual Report: Blurring of vision ("Blurry for a while now" not this episode)       Perception  Praxis      Pertinent Vitals/Pain Pain Assessment: No/denies pain     Hand Dominance Right   Extremity/Trunk Assessment Upper Extremity Assessment Upper Extremity Assessment: Generalized weakness;RUE deficits/detail RUE Deficits / Details: Noting R  slightly weaker than L. However, R sided infarct. May be due to IV placement   Lower Extremity Assessment Lower Extremity Assessment: Defer to PT evaluation   Cervical / Trunk Assessment Cervical / Trunk Assessment: Normal   Communication Communication Communication: No difficulties   Cognition Arousal/Alertness: Awake/alert Behavior During Therapy: WFL for tasks assessed/performed Overall Cognitive Status: Impaired/Different from baseline Area of Impairment: Attention;Awareness;Problem solving                   Current Attention Level: Sustained       Awareness: Intellectual Problem Solving: Requires verbal cues General Comments: Pt reports she has not used a cane or wanted to despite her frequent falls and bouts of vertigo; distracted by IV at times   General Comments  Daughter and spouse present in room and state pt is answering questions re: home setup appropriately    Exercises     Shoulder Instructions      Home Living Family/patient expects to be discharged to:: Private residence Living Arrangements: Spouse/significant other Available Help at Discharge: Family Type of Home: House Home Access: Stairs to enter [employer] of Steps: 1   Home Layout: Two level;Laundry or work area in basement;Able to live on main level with bedroom/bathroom     Bathroom Shower/Tub: Arts development officer: Standard     Home Equipment: Environmental consultant - 2 wheels          Prior Functioning/Environment Level of Independence: Independent        Comments: reports multiple falls over past year; reports she catches her feet and can't recover or "just falls over like a tree" with h/o vertigo        OT Problem List: Decreased strength;Decreased range of motion;Decreased activity tolerance;Impaired balance (sitting and/or standing);Decreased knowledge of use of DME or AE;Decreased cognition;Decreased knowledge of precautions      OT  Treatment/Interventions: Self-care/ADL training;Therapeutic exercise;Energy conservation;DME and/or AE instruction;Therapeutic activities;Patient/family education    OT Goals(Current goals can be found in the care plan section) Acute Rehab OT Goals Patient Stated Goal: return home in [place] OT Goal Formulation: With patient/family Time For Goal Achievement: 08/04/20 Potential to Achieve Goals: Good  OT Frequency: Min 3X/week   Barriers to D/C:            Co-evaluation PT/OT/SLP Co-Evaluation/Treatment: Yes Reason for Co-Treatment: For patient/therapist safety;To address functional/ADL transfers   OT goals addressed during session: ADL's and self-care      AM-PAC OT "6 Clicks" Daily Activity     Outcome Measure Help from another person eating meals?: A Little Help from another person taking care of personal grooming?: A Little Help from another person toileting, which includes using toliet, bedpan, or urinal?: A Little Help from another person bathing (including washing, rinsing, drying)?: A Little Help from another person to put on and taking off regular upper body clothing?: A Little Help from another person to put on and taking off regular lower body clothing?: A Little 6 Click Score: 18   End of Session Equipment Utilized During Treatment: Gait belt Nurse Communication: Mobility status (BP)  Activity Tolerance: Patient tolerated treatment well Patient left: in chair;with call bell/phone within reach;with chair alarm set;with family/visitor present  OT Visit Diagnosis: Unsteadiness on feet (R26.81);Other abnormalities of  gait and mobility (R26.89);Muscle weakness (generalized) (M62.81)                Time: 7741-4239 OT Time Calculation (min): 25 min Charges:  OT General Charges $OT Visit: 1 Visit OT Evaluation $OT Eval Moderate Complexity: 1 Mod  Danisa Kopec MSOT, OTR/L Acute Rehab Pager: [phone] Office: [phone]  Theodoro Grist Hulan Szumski 07/21/2020, 3:37 PM

## 2020-07-21 NOTE — Progress Notes (Signed)
EEG complete - results pending 

## 2020-07-21 NOTE — Progress Notes (Signed)
  Echocardiogram 2D Echocardiogram has been performed.  Emma Adams 07/21/2020, 3:39 PM

## 2020-07-21 NOTE — Procedures (Signed)
Patient Name: Emma Adams  MRN: [id number]  Epilepsy Attending: Charlsie Quest  Referring Physician/Provider: Dr Erick Blinks Date: 07/21/2020 Duration: 25.06 mins  Patient history: 75yo F with R cerebellar hemorrhage and first time seizure. EEG to evaluate for seizure.   Level of alertness: Awake  AEDs during EEG study: LEV  Technical aspects: This EEG study was done with scalp electrodes positioned according to the 10-20 International system of electrode placement. Electrical activity was acquired at a sampling rate of 500Hz  and reviewed with a high frequency filter of 70Hz  and a low frequency filter of 1Hz . EEG data were recorded continuously and digitally stored.   Description: The posterior dominant rhythm consists of 8 Hz activity of moderate voltage (25-35 uV) seen predominantly in posterior head regions, symmetric and reactive to eye opening and eye closing. Hyperventilation and photic stimulation were not performed.     IMPRESSION: This study is within normal limits. No seizures or epileptiform discharges were seen throughout the recording.  Daoud Lobue 

## 2020-07-21 NOTE — Progress Notes (Signed)
VASCULAR LAB    Carotid duplex has been performed.  See CV proc for preliminary results.   Dantre Yearwood, RVT 07/21/2020, 12:47 PM

## 2020-07-21 NOTE — Progress Notes (Signed)
STROKE TEAM PROGRESS NOTE   SUBJECTIVE (INTERVAL HISTORY) Her husband and daughter are at the bedside, granddaughter on the phone.  Overall her condition is rapidly improving.  Patient came for her granddaughter graduation from [facility].  However yesterday had seizure episode during the car ride.  Post ictal in ER, CT found small right cerebellum ICH.  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   OBJECTIVE Temp:  [98.3 F (36.8 C)-99.8 F (37.7 C)] 98.8 F (37.1 C) (05/14 1600) Pulse Rate:  [41-89] 69 (05/14 1700) Resp:  [12-27] 13 (05/14 1500) BP: (73-249)/(40-214) 132/55 (05/14 1700) SpO2:  [86 %-97 %] 96 % (05/14 1700)  Recent Labs  Lab 07/20/20 1527 07/20/20 2311 07/21/20 0734 07/21/20 1114  GLUCAP 149* 134* 149* 134*   Recent Labs  Lab 07/20/20 1610 07/20/20 1623  NA 138 141  K 3.9 3.8  CL 107 109  CO2 20*  --   GLUCOSE 129* 130*  BUN 16 17  CREATININE 0.78 0.70  CALCIUM 9.7  --    Recent Labs  Lab 07/20/20 1610  AST 24  ALT 17  ALKPHOS 52  BILITOT 0.9  PROT 7.2  ALBUMIN 4.2   Recent Labs  Lab 07/20/20 1610 07/20/20 1623  WBC 8.0  --   NEUTROABS 6.4  --   HGB 14.3 14.3  HCT 43.5 42.0  MCV 94.2  --   PLT 166  --    No results for input(s): CKTOTAL, CKMB, CKMBINDEX, TROPONINI in the last 168 hours. Recent Labs    07/20/20 1610  LABPROT 12.6  INR 0.9   Recent Labs    07/20/20 1800  COLORURINE STRAW*  LABSPEC 1.010  PHURINE 6.0  GLUCOSEU NEGATIVE  HGBUR NEGATIVE  BILIRUBINUR NEGATIVE  KETONESUR 5*  PROTEINUR NEGATIVE  NITRITE NEGATIVE  LEUKOCYTESUR NEGATIVE       Component Value Date/Time   CHOL 199 07/20/2020 1929   TRIG 247 (H) 07/20/2020 1929   HDL 48 07/20/2020 1929   CHOLHDL 4.1 07/20/2020 1929   VLDL 49 (H) 07/20/2020 1929   LDLCALC 102 (H) 07/20/2020 1929   Lab Results  Component Value Date   HGBA1C 6.7 (H) 07/20/2020      Component Value Date/Time   LABOPIA NONE DETECTED 07/20/2020 1800    COCAINSCRNUR NONE DETECTED 07/20/2020 1800   LABBENZ NONE DETECTED 07/20/2020 1800   AMPHETMU NONE DETECTED 07/20/2020 1800   THCU NONE DETECTED 07/20/2020 1800   LABBARB NONE DETECTED 07/20/2020 1800    Recent Labs  Lab 07/20/20 1610  ETH <10    I have personally reviewed the radiological images below and agree with the radiology interpretations.  CT HEAD WO CONTRAST  Result Date: 07/20/2020 CLINICAL DATA:  Seizure activity while driving. EXAM: CT HEAD WITHOUT CONTRAST TECHNIQUE: Contiguous axial images were obtained from the base of the skull through the vertex without intravenous contrast. COMPARISON:  None. FINDINGS: Brain: 5-6 mm acute hemorrhage within the medial right cerebellum. No mass effect. Cerebral hemispheres show mild age related volume loss with mild chronic small-vessel change of the deep white matter. No sign of acute infarction, mass lesion or extra-axial collection. Vascular: There is atherosclerotic calcification of the major vessels at the base of the brain. Skull: Negative Sinuses/Orbits: Clear/normal Other: None IMPRESSION: 5-6 mm acute hemorrhage within the medial right cerebellum. Mild cerebral atrophy and mild chronic small-vessel change of the white matter. These results were communicated to Dr. Derry Lory At 3:59 pmon 5/13/2022by text page via the [facility] messaging  system. Electronically Signed   By: Paulina Fusi M.D.   On: 07/20/2020 16:02   MR ANGIO HEAD WO CONTRAST  Result Date: 07/20/2020 CLINICAL DATA:  Initial evaluation for acute intracranial hemorrhage, seizure. EXAM: MRI HEAD WITHOUT CONTRAST MRA HEAD WITHOUT CONTRAST TECHNIQUE: Multiplanar, multi-echo pulse sequences of the brain and surrounding structures were acquired without intravenous contrast. Angiographic images of the Circle of Willis were acquired using MRA technique without intravenous contrast. COMPARISON: No pertinent prior exam. COMPARISON:  Prior head CT from earlier the same day. FINDINGS: MRI  HEAD FINDINGS Brain: Examination moderately degraded by motion artifact. Cerebral volume within normal limits for age. Patchy T2/FLAIR hyperintensity within the periventricular deep white matter both cerebral hemispheres most consistent with chronic small vessel ischemic disease, mild-to-moderate in appearance. Probable patchy involvement of the pons. Previously identified small intraparenchymal hemorrhage positioned at the right cerebellum again seen, similar measuring 8 mm on this exam (series 7, image 7). Minimal surrounding edema without significant regional mass effect. No intraventricular extension into the adjacent fourth ventricle. No other visible acute intracranial hemorrhage on this motion degraded exam. Innumerable chronic micro hemorrhages seen throughout the bilateral parieto-occipital region, with a few additional foci involving both frontal lobes and thalami. Distribution favors chronic poorly controlled hypertension. No other evidence for acute or subacute infarct. Gray-white matter differentiation otherwise maintained. No mass lesion, mass effect or midline shift. No hydrocephalus or extra-axial fluid collection. Pituitary gland suprasellar region normal. Midline structures intact. Vascular: Major intracranial vascular flow voids are maintained. Skull and upper cervical spine: Craniocervical junction within normal limits. Bone marrow signal intensity normal. No scalp soft tissue abnormality. Sinuses/Orbits: Globes and orbital soft tissues within normal limits. Paranasal sinuses are largely clear. No mastoid effusion. Inner ear structures grossly normal. Other: None. MRA HEAD FINDINGS ANTERIOR CIRCULATION: Examination degraded by motion artifact. Visualized distal cervical segments of the internal carotid arteries are patent with antegrade flow. Petrous, cavernous, and supraclinoid segments patent without stenosis or other abnormality. A1 segments widely patent. Normal anterior communicating artery  complex. Anterior cerebral arteries patent to their distal aspects without stenosis. No M1 stenosis or occlusion. Normal MCA bifurcations. Distal MCA branches well perfused and symmetric. Probable distal mild small vessel atheromatous irregularity. POSTERIOR CIRCULATION: Visualized distal V4 segments patent without stenosis. Neither PICA origin well visualized on this exam. Basilar patent to its distal aspect without stenosis. Superior cerebellar arteries patent proximally. Both PCAs primarily supplied via the basilar. Small right posterior communicating artery noted as well. PCAs well perfused to their distal aspects without stenosis. No intracranial aneurysm. No vascular abnormality seen underlying the right cerebellar hemorrhage. IMPRESSION: MRI HEAD IMPRESSION: 1. 8 mm right cerebellar intraparenchymal hemorrhage with minimal surrounding edema. No significant regional mass effect. 2. Innumerable chronic micro hemorrhages involving the bilateral parieto-occipital region, with more mild involvement about the frontal lobes and thalami. Findings felt to be most consistent with chronic poorly controlled hypertension. 3. 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 MRA HEAD IMPRESSION: 1. Negative intracranial MRA with no large vessel occlusion or hemodynamically significant stenosis. 2. No vascular abnormality seen underlying the right cerebellar hemorrhage. 3. Distal small vessel atheromatous irregularity. Electronically Signed   By: Rise Mu M.D.   On: 07/20/2020 23:26   MR BRAIN WO CONTRAST  Result Date: 07/20/2020 CLINICAL DATA:  Initial evaluation for acute intracranial hemorrhage, seizure. EXAM: MRI HEAD WITHOUT CONTRAST MRA HEAD WITHOUT CONTRAST TECHNIQUE: Multiplanar, multi-echo pulse sequences of the brain and surrounding structures were acquired without intravenous contrast. Angiographic images of the Circle of  Willis were acquired using MRA technique without intravenous  contrast. COMPARISON: No pertinent prior exam. COMPARISON:  Prior head CT from earlier the same day. FINDINGS: MRI HEAD FINDINGS Brain: Examination moderately degraded by motion artifact. Cerebral volume within normal limits for age. Patchy T2/FLAIR hyperintensity within the periventricular deep white matter both cerebral hemispheres most consistent with chronic small vessel ischemic disease, mild-to-moderate in appearance. Probable patchy involvement of the pons. Previously identified small intraparenchymal hemorrhage positioned at the right cerebellum again seen, similar measuring 8 mm on this exam (series 7, image 7). Minimal surrounding edema without significant regional mass effect. No intraventricular extension into the adjacent fourth ventricle. No other visible acute intracranial hemorrhage on this motion degraded exam. Innumerable chronic micro hemorrhages seen throughout the bilateral parieto-occipital region, with a few additional foci involving both frontal lobes and thalami. Distribution favors chronic poorly controlled hypertension. No other evidence for acute or subacute infarct. Gray-white matter differentiation otherwise maintained. No mass lesion, mass effect or midline shift. No hydrocephalus or extra-axial fluid collection. Pituitary gland suprasellar region normal. Midline structures intact. Vascular: Major intracranial vascular flow voids are maintained. Skull and upper cervical spine: Craniocervical junction within normal limits. Bone marrow signal intensity normal. No scalp soft tissue abnormality. Sinuses/Orbits: Globes and orbital soft tissues within normal limits. Paranasal sinuses are largely clear. No mastoid effusion. Inner ear structures grossly normal. Other: None. MRA HEAD FINDINGS ANTERIOR CIRCULATION: Examination degraded by motion artifact. Visualized distal cervical segments of the internal carotid arteries are patent with antegrade flow. Petrous, cavernous, and supraclinoid  segments patent without stenosis or other abnormality. A1 segments widely patent. Normal anterior communicating artery complex. Anterior cerebral arteries patent to their distal aspects without stenosis. No M1 stenosis or occlusion. Normal MCA bifurcations. Distal MCA branches well perfused and symmetric. Probable distal mild small vessel atheromatous irregularity. POSTERIOR CIRCULATION: Visualized distal V4 segments patent without stenosis. Neither PICA origin well visualized on this exam. Basilar patent to its distal aspect without stenosis. Superior cerebellar arteries patent proximally. Both PCAs primarily supplied via the basilar. Small right posterior communicating artery noted as well. PCAs well perfused to their distal aspects without stenosis. No intracranial aneurysm. No vascular abnormality seen underlying the right cerebellar hemorrhage. IMPRESSION: MRI HEAD IMPRESSION: 1. 8 mm right cerebellar intraparenchymal hemorrhage with minimal surrounding edema. No significant regional mass effect. 2. Innumerable chronic micro hemorrhages involving the bilateral parieto-occipital region, with more mild involvement about the frontal lobes and thalami. Findings felt to be most consistent with chronic poorly controlled hypertension. 3. 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 MRA HEAD IMPRESSION: 1. Negative intracranial MRA with no large vessel occlusion or hemodynamically significant stenosis. 2. No vascular abnormality seen underlying the right cerebellar hemorrhage. 3. Distal small vessel atheromatous irregularity. Electronically Signed   By: Rise Mu M.D.   On: 07/20/2020 23:26   EEG adult  Result Date: 07/21/2020 Charlsie Quest, MD     07/21/2020 12:43 PM Patient Name: Maleeka Sabatino MRN: [id number] Epilepsy Attending: Charlsie Quest Referring Physician/Provider: Dr Erick Blinks Date: 07/21/2020 Duration: 25.06 mins Patient history: 75yo F with R cerebellar hemorrhage and  first time seizure. EEG to evaluate for seizure. Level of alertness: Awake AEDs during EEG study: LEV Technical aspects: This EEG study was done with scalp electrodes positioned according to the 10-20 International system of electrode placement. Electrical activity was acquired at a sampling rate of 500Hz  and reviewed with a high frequency filter of 70Hz  and a low frequency filter of 1Hz . EEG data were recorded continuously  and digitally stored. Description: The posterior dominant rhythm consists of 8 Hz activity of moderate voltage (25-35 uV) seen predominantly in posterior head regions, symmetric and reactive to eye opening and eye closing. Hyperventilation and photic stimulation were not performed.   IMPRESSION: This study is within normal limits. No seizures or epileptiform discharges were seen throughout the recording. Charlsie Questriyanka O Yadav   ECHOCARDIOGRAM COMPLETE  Result Date: 07/21/2020    ECHOCARDIOGRAM REPORT   Patient Name:   Emma Adams Date of Exam: 07/21/2020 Medical Rec #:  [id number]   Height:       66.0 in Accession #:    [id number]  Weight:       212.0 lb Date of Birth:  [birth year]   BSA:          2.050 m Patient Age:    75 years    BP:           120/51 mmHg Patient Gender: F           HR:           76 bpm. Exam Location:  Inpatient Procedure: 2D Echo, Cardiac Doppler and Color Doppler Indications:    Stroke I63.9  History:        Patient has no prior history of Echocardiogram examinations.  Sonographer:    Roosvelt Maserachel Lane Referring Phys: [id number] [facility] IMPRESSIONS  1. Vigorous LV systolic function with intracavitary gradient of 2 m/s that increases to 3 m/s with valsalva.  2. Left ventricular ejection fraction, by estimation, is >75%. The left ventricle has hyperdynamic function. The left ventricle has no regional wall motion abnormalities. There is mild left ventricular hypertrophy. Left ventricular diastolic parameters are consistent with Grade I diastolic dysfunction (impaired relaxation).   3. Right ventricular systolic function is normal. The right ventricular size is normal.  4. The mitral valve is normal in structure. Trivial mitral valve regurgitation. No evidence of mitral stenosis.  5. The aortic valve is tricuspid. Aortic valve regurgitation is trivial. Mild aortic valve sclerosis is present, with no evidence of aortic valve stenosis.  6. The inferior vena cava is normal in size with greater than 50% respiratory variability, suggesting right atrial pressure of 3 mmHg. FINDINGS  Left Ventricle: Left ventricular ejection fraction, by estimation, is >75%. The left ventricle has hyperdynamic function. The left ventricle has no regional wall motion abnormalities. The left ventricular internal cavity size was normal in size. There is mild left ventricular hypertrophy. Left ventricular diastolic parameters are consistent with Grade I diastolic dysfunction (impaired relaxation). Right Ventricle: The right ventricular size is normal. Right ventricular systolic function is normal. Left Atrium: Left atrial size was normal in size. Right Atrium: Right atrial size was normal in size. Pericardium: There is no evidence of pericardial effusion. Mitral Valve: The mitral valve is normal in structure. Mild mitral annular calcification. Trivial mitral valve regurgitation. No evidence of mitral valve stenosis. Tricuspid Valve: The tricuspid valve is normal in structure. Tricuspid valve regurgitation is trivial. No evidence of tricuspid stenosis. Aortic Valve: The aortic valve is tricuspid. Aortic valve regurgitation is trivial. Mild aortic valve sclerosis is present, with no evidence of aortic valve stenosis. Aortic valve mean gradient measures 6.0 mmHg. Aortic valve peak gradient measures 11.0 mmHg. Aortic valve area, by VTI measures 2.28 cm. Pulmonic Valve: The pulmonic valve was normal in structure. Pulmonic valve regurgitation is not visualized. No evidence of pulmonic stenosis. Aorta: The aortic root is  normal in size and structure. Venous: The inferior vena cava  is normal in size with greater than 50% respiratory variability, suggesting right atrial pressure of 3 mmHg. IAS/Shunts: No atrial level shunt detected by color flow Doppler. Additional Comments: Vigorous LV systolic function with intracavitary gradient of 2 m/s that increases to 3 m/s with valsalva.  LEFT VENTRICLE PLAX 2D LVIDd:         4.50 cm  Diastology LVIDs:         2.80 cm  LV e' medial:    6.09 cm/s LV PW:         1.30 cm  LV E/e' medial:  9.5 LV IVS:        1.30 cm  LV e' lateral:   4.24 cm/s LVOT diam:     1.90 cm  LV E/e' lateral: 13.6 LV SV:         70 LV SV Index:   34 LVOT Area:     2.84 cm  RIGHT VENTRICLE          IVC RV Basal diam:  3.80 cm  IVC diam: 1.70 cm LEFT ATRIUM             Index       RIGHT ATRIUM           Index LA diam:        4.10 cm 2.00 cm/m  RA Area:     17.70 cm LA Vol (A2C):   73.0 ml 35.60 ml/m RA Volume:   46.90 ml  22.87 ml/m LA Vol (A4C):   51.5 ml 25.12 ml/m LA Biplane Vol: 65.1 ml 31.75 ml/m  AORTIC VALVE AV Area (Vmax):    2.08 cm AV Area (Vmean):   2.05 cm AV Area (VTI):     2.28 cm AV Vmax:           166.00 cm/s AV Vmean:          116.000 cm/s AV VTI:            0.306 m AV Peak Grad:      11.0 mmHg AV Mean Grad:      6.0 mmHg LVOT Vmax:         122.00 cm/s LVOT Vmean:        83.900 cm/s LVOT VTI:          0.247 m LVOT/AV VTI ratio: 0.81  AORTA Ao Root diam: 3.30 cm Ao Asc diam:  3.00 cm MITRAL VALVE MV Area (PHT): 2.68 cm    SHUNTS MV Decel Time: 283 msec    Systemic VTI:  0.25 m MV E velocity: 57.80 cm/s  Systemic Diam: 1.90 cm MV A velocity: 99.00 cm/s MV E/A ratio:  0.58 Olga Millers MD Electronically signed by Olga Millers MD Signature Date/Time: 07/21/2020/3:49:19 PM    Final    VAS US CAROTID  Result Date: 07/21/2020 Carotid Arterial Duplex Study Patient Name:  Emma Adams  Date of Exam:   07/21/2020 Medical Rec #: [id number]    Accession #:    [id number] Date of Birth: 07/30/1945    Patient  Gender: F Patient Age:   84Y Exam Location:  [facility] Procedure:      VAS US CAROTID Referring Phys: [id number] [facility] --------------------------------------------------------------------------------  Indications:       Right cerebellar intraparenchymal hemorrhage.Seizure. Risk Factors:      Hypertension, hyperlipidemia, Diabetes. Other Factors:     Non compliant with medications. Limitations        Today's exam was limited due to the body  habitus of the                    patient. Comparison Study:  No prior study on file Performing Technologist: Sherren Kerns RVS  Examination Guidelines: A complete evaluation includes B-mode imaging, spectral Doppler, color Doppler, and power Doppler as needed of all accessible portions of each vessel. Bilateral testing is considered an integral part of a complete examination. Limited examinations for reoccurring indications may be performed as noted.  Right Carotid Findings: +----------+--------+--------+--------+------------------+--------+           PSV cm/sEDV cm/sStenosisPlaque DescriptionComments +----------+--------+--------+--------+------------------+--------+ CCA Prox  133     14                                         +----------+--------+--------+--------+------------------+--------+ CCA Distal149     14                                         +----------+--------+--------+--------+------------------+--------+ ICA Prox  80      9               homogeneous                +----------+--------+--------+--------+------------------+--------+ ICA Distal102     26                                         +----------+--------+--------+--------+------------------+--------+ ECA       162     0                                          +----------+--------+--------+--------+------------------+--------+ +----------+--------+-------+--------+-------------------+           PSV cm/sEDV cmsDescribeArm Pressure (mmHG)  +----------+--------+-------+--------+-------------------+ Subclavian100                                        +----------+--------+-------+--------+-------------------+ +---------+--------+--+--------+--+ VertebralPSV cm/s53EDV cm/s11 +---------+--------+--+--------+--+  Left Carotid Findings: +----------+--------+--------+--------+------------------+------------------+           PSV cm/sEDV cm/sStenosisPlaque DescriptionComments           +----------+--------+--------+--------+------------------+------------------+ CCA Prox  116     20                                intimal thickening +----------+--------+--------+--------+------------------+------------------+ CCA Distal91      19                                intimal thickening +----------+--------+--------+--------+------------------+------------------+ ICA Prox  72      15              homogeneous                          +----------+--------+--------+--------+------------------+------------------+ ICA Distal69      11                                                   +----------+--------+--------+--------+------------------+------------------+  ECA       123     13                                                   +----------+--------+--------+--------+------------------+------------------+ +----------+--------+--------+--------+-------------------+           PSV cm/sEDV cm/sDescribeArm Pressure (mmHG) +----------+--------+--------+--------+-------------------+ [id number]                                         +----------+--------+--------+--------+-------------------+ +---------+--------+--+--------+--+ VertebralPSV cm/s80EDV cm/s15 +---------+--------+--+--------+--+   Summary: Right Carotid: The extracranial vessels were near-normal with only minimal wall                thickening or plaque. Left Carotid: The extracranial vessels were near-normal with only minimal wall                thickening or plaque. Vertebrals:  Bilateral vertebral arteries demonstrate antegrade flow. Subclavians: Normal flow hemodynamics were seen in bilateral subclavian              arteries. *See table(s) above for measurements and observations.     Preliminary      PHYSICAL EXAM  Temp:  [98.3 F (36.8 C)-99.8 F (37.7 C)] 98.8 F (37.1 C) (05/14 1600) Pulse Rate:  [41-89] 69 (05/14 1700) Resp:  [12-27] 13 (05/14 1500) BP: (73-249)/(40-214) 132/55 (05/14 1700) SpO2:  [86 %-97 %] 96 % (05/14 1700)  General - Well nourished, well developed, in no apparent distress.  Ophthalmologic - fundi not visualized due to noncooperation.  Cardiovascular - Regular rhythm and rate.  Mental Status -  Level of arousal and orientation to time, place, and person were intact. Language including expression, naming, repetition, comprehension was assessed and found intact.  Cranial Nerves II - XII - II - Visual field intact OU. III, IV, VI - Extraocular movements intact. V - Facial sensation intact bilaterally. VII - Facial movement intact bilaterally. VIII - Hearing & vestibular intact bilaterally. X - Palate elevates symmetrically. XI - Chin turning & shoulder shrug intact bilaterally. XII - Tongue protrusion intact.  Motor Strength - The patient's strength was normal in all extremities and pronator drift was absent.  Bulk was normal and fasciculations were absent.   Motor Tone - Muscle tone was assessed at the neck and appendages and was normal.  Reflexes - The patient's reflexes were symmetrical in all extremities and she had no pathological reflexes.  Sensory - Light touch, temperature/pinprick were assessed and were symmetrical.    Coordination - The patient had normal movements in the hands and feet with no ataxia or dysmetria.  Tremor was absent.  Gait and Station - deferred.   ASSESSMENT/PLAN Ms. Emma Adams is a 75 y.o. female with history of hypertension, hyperlipidemia, diabetes  admitted for seizure. No tPA given due to ICH.  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  Recommend no antiplatelet or anticoagulation given concern of CAA.  Patient counseled to be compliant with her antithrombotic medications  Ongoing aggressive stroke risk factor management  Therapy recommendations: Outpatient PT/OT  Disposition: Pending  ? 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  Strict BP goal < 140  No antiplatelet or anticoagulation  Diabetes  HgbA1c 6.7 goal < 7.0  Controlled  CBG monitoring  SSI  close PCP follow up  Hypertension . Home meds cords are . Stable . 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 On Cleviprex, taper off as able . 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  This patient's care requires constant monitoring of vital signs, hemodynamics, respiratory and cardiac monitoring, review of multiple databases, neurological assessment, discussion with family, other specialists and medical decision making of high complexity. I spent 40 minutes of neurocritical care time in the care of this patient. I had long discussion with daughter at bedside and granddaughter over the phone, updated pt current condition, treatment plan and potential prognosis, and answered all the questions.  They expressed understanding and appreciation.    Marvel Plan, MD PhD Stroke Neurology 07/21/2020 6:06 PM    To contact Stroke Continuity provider, please refer to [email or web address]. After hours, contact General Neurology

## 2020-07-21 NOTE — Progress Notes (Signed)
Physical Therapy Evaluation Patient Details Name: Emma Adams MRN: [id number] DOB: 12/26/1945 Today's Date: 07/21/2020   History of Present Illness  75 y.o. female with PMH significant for HTN, HLD, DM2, hypothyroidism who is non compliant with medications who comes in to the ED 07/20/20 with 1 min seizure in the car. Pt passenger in car as traveling through [place] back to [place]. 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 MRi pending  Clinical Impression   Pt admitted secondary to problem above with deficits below. PTA patient was independent living with spouse on their farm in [place]. 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  Pt currently requires at least min assist for bed to chair transfer and was unable to complete full balance or gait assessment due to elevated SBP>140 (max 149-151). Based on recent falls and vertigo, discussed likely need for assistive device and pt hesitant to consider a cane (although she likely may need a RW).  Will continue to follow acutely to maximize functional mobility independence and safety.       Follow Up Recommendations Outpatient PT;Supervision for mobility/OOB    Equipment Recommendations  Rolling walker with 5" wheels (*pt may only agree to using cane--TBA)    Recommendations for Other Services       Precautions / Restrictions Precautions Precautions: Fall Precaution Comments: frequent LOB and falls PTA; h/o vertigo      Mobility  Bed Mobility Overal bed mobility: Needs Assistance Bed Mobility: Supine to Sit     Supine to sit: Supervision     General bed mobility comments: for lines/safety due to dizziness    Transfers Overall transfer level: Needs assistance Equipment used: 1 person hand held assist Transfers: Sit to/from Stand;Stand Pivot Transfers Sit to Stand: Min assist Stand pivot transfers: Min  assist       General transfer comment: assist for posterior imbalance on initial standing; reported +dizziness  Ambulation/Gait             General Gait Details: deferred due to elevated BP>140  Stairs            Wheelchair Mobility    Modified Rankin (Stroke Patients Only) Modified Rankin (Stroke Patients Only) Pre-Morbid Rankin Score: No significant disability Modified Rankin: Moderately severe disability     Balance Overall balance assessment: Needs assistance Sitting-balance support: No upper extremity supported;Feet supported Sitting balance-Leahy Scale: Good Sitting balance - Comments: donning socks with crossing legs at EOB   Standing balance support: Single extremity supported Standing balance-Leahy Scale: Poor Standing balance comment: posterior bias in standing                             Pertinent Vitals/Pain Pain Assessment: No/denies pain    Home Living Family/patient expects to be discharged to:: Private residence Living Arrangements: Spouse/significant other Available Help at Discharge: Family Type of Home: House Home Access: Stairs to enter   Secretary/administrator of Steps: 1 Home Layout: Two level;Laundry or work area in basement;Able to live on main level with bedroom/bathroom Home Equipment: Environmental consultant - 2 wheels      Prior Function Level of Independence: Independent         Comments: reports multiple falls over past year; reports she catches her feet and can't recover or "just falls over like a tree" with h/o vertigo  Hand Dominance   Dominant Hand: Right    Extremity/Trunk Assessment   Upper Extremity Assessment Upper Extremity Assessment: Defer to OT evaluation    Lower Extremity Assessment Lower Extremity Assessment: Generalized weakness    Cervical / Trunk Assessment Cervical / Trunk Assessment: Normal  Communication   Communication: No difficulties  Cognition Arousal/Alertness:  Awake/alert Behavior During Therapy: WFL for tasks assessed/performed Overall Cognitive Status: Impaired/Different from baseline Area of Impairment: Attention;Awareness;Problem solving                   Current Attention Level: Sustained       Awareness: Intellectual Problem Solving: Requires verbal cues General Comments: Pt reports she has not used a cane or wanted to despite her frequent falls and bouts of vertigo; distracted by IV at times      General Comments General comments (skin integrity, edema, etc.): Daughter and spouse present in room and state pt is answering questions re: home setup appropriately    Exercises     Assessment/Plan    PT Assessment Patient needs continued PT services  PT Problem List Decreased strength;Decreased balance;Decreased mobility;Decreased cognition;Decreased knowledge of use of DME;Decreased safety awareness;Cardiopulmonary status limiting activity       PT Treatment Interventions DME instruction;Gait training;Stair training;Functional mobility training;Therapeutic activities;Balance training;Neuromuscular re-education;Cognitive remediation;Patient/family education    PT Goals (Current goals can be found in the Care Plan section)  Acute Rehab PT Goals Patient Stated Goal: return home in [place] PT Goal Formulation: With patient Time For Goal Achievement: 08/04/20 Potential to Achieve Goals: Good    Frequency Min 4X/week   Barriers to discharge Other (comment) lives in [place] with no nearby family    Co-evaluation               AM-PAC PT "6 Clicks" Mobility  Outcome Measure Help needed turning from your back to your side while in a flat bed without using bedrails?: None Help needed moving from lying on your back to sitting on the side of a flat bed without using bedrails?: A Little Help needed moving to and from a bed to a chair (including a wheelchair)?: A Little Help needed standing up from a chair using your arms  (e.g., wheelchair or bedside chair)?: A Little Help needed to walk in hospital room?: Total Help needed climbing 3-5 steps with a railing? : Total 6 Click Score: 15    End of Session   Activity Tolerance: Treatment limited secondary to medical complications (Comment) (limited by elevated SBP>140 (149-151)) Patient left: in chair;with call bell/phone within reach;with chair alarm set;with family/visitor present Nurse Communication: Mobility status;Other (comment) (elevated BP) PT Visit Diagnosis: Unsteadiness on feet (R26.81);Repeated falls (R29.6);Muscle weakness (generalized) (M62.81);Dizziness and giddiness (R42)    Time: 2956-2130 PT Time Calculation (min) (ACUTE ONLY): 30 min   Charges:   PT Evaluation $PT Eval Moderate Complexity: 1 Mod           Jerolyn Center, PT Pager [phone]   [name] 07/21/2020, 1:07 PM

## 2020-07-22 ENCOUNTER — Inpatient Hospital Stay (HOSPITAL_COMMUNITY): Payer: Medicare Other

## 2020-07-22 LAB — GLUCOSE, CAPILLARY: Glucose-Capillary: 160 mg/dL — ABNORMAL HIGH (ref 70–99)

## 2020-07-22 IMAGING — CT CT HEAD W/O CM
4 series · 15 of 47 positions shown, 17 images · non-contrast
Comparison: Brain MRI/MRA [DATE].  Head CT [DATE].

CLINICAL DATA: Cerebral hemorrhage suspected. Evaluate suspected
cerebral hemorrhage.

EXAM:
CT HEAD WITHOUT CONTRAST
TECHNIQUE: Contiguous axial images were obtained from the base of the skull
through the vertex without intravenous contrast.

[Series 3: head without · axial · non-contrast · 0.45mm/px · z∈[+1249,+1369]mm · 7 of 33 slices shown, 9 images]
[im 5/33  brain]
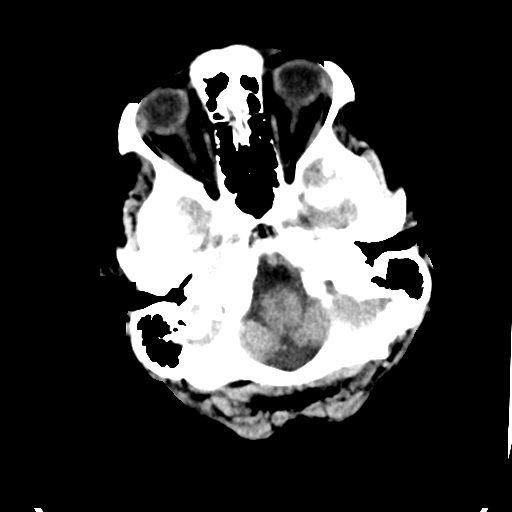
[im 5/33  bone]
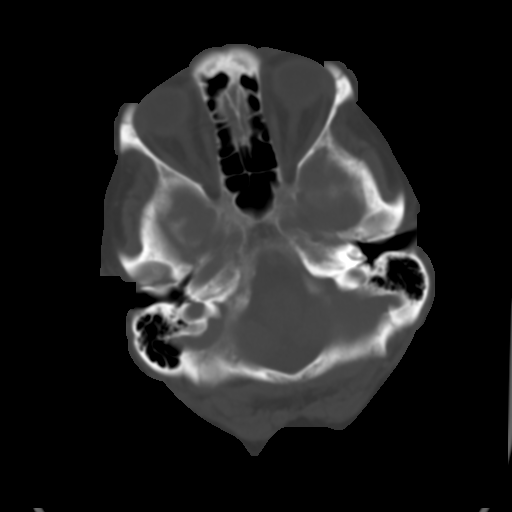
[im 9/33  brain]
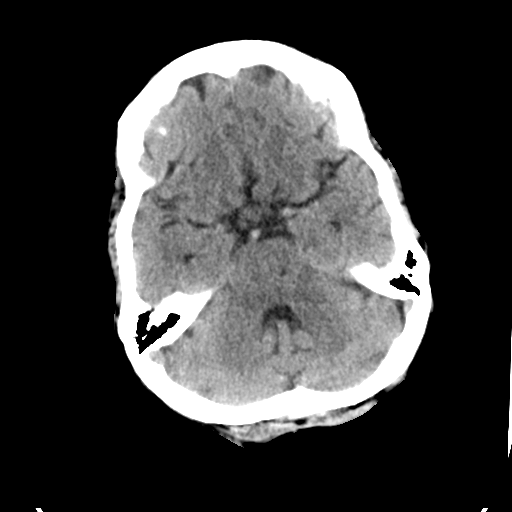
[im 13/33  brain]
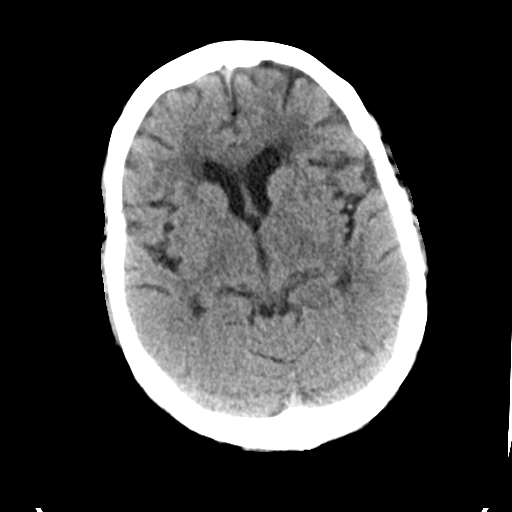
[im 17/33  brain]
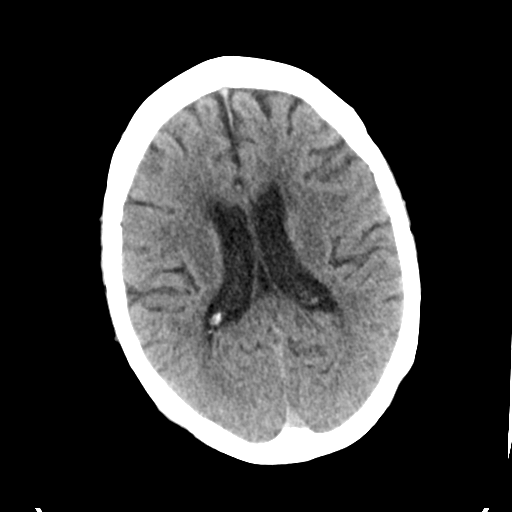
[im 21/33  brain]
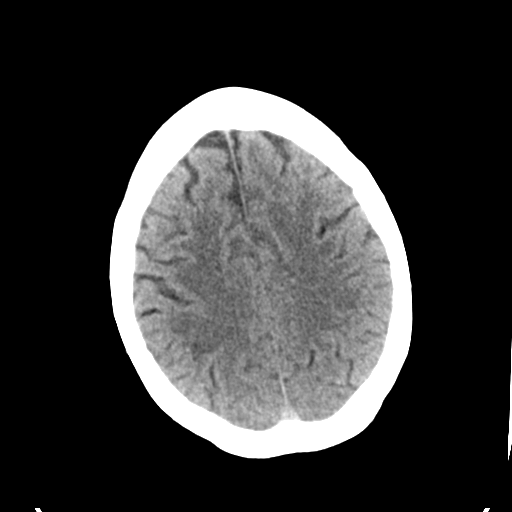
[im 21/33  bone]
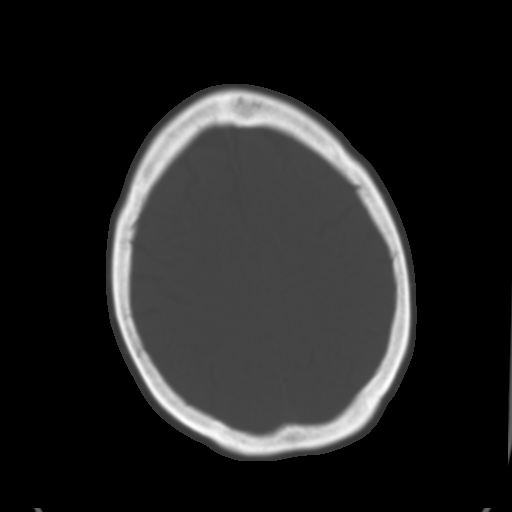
[im 25/33  brain]
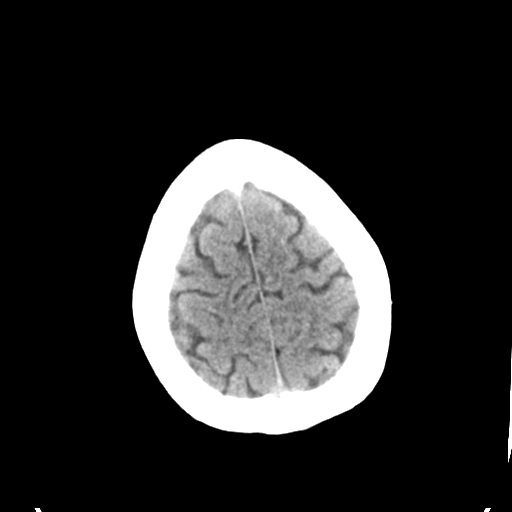
[im 29/33  brain]
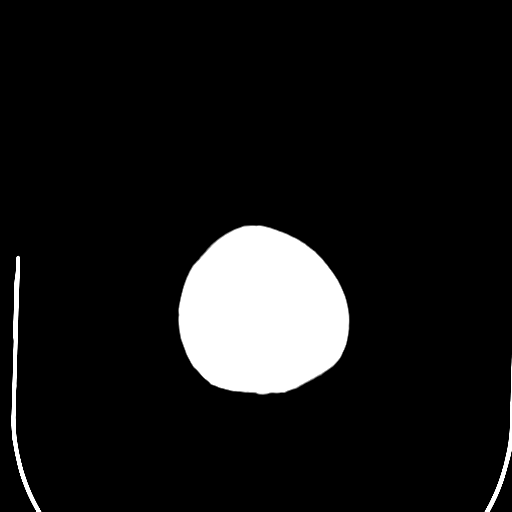

[Series 4: head bone · axial · 0.45mm/px · z∈[+1245,+1261]mm · 2 of 81 slices shown]
[im 9/81  bone]
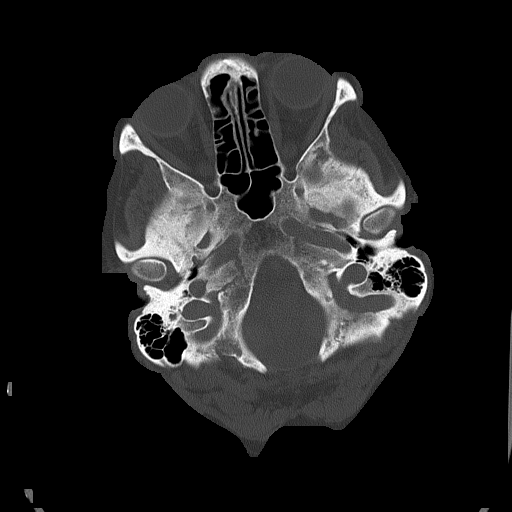
[im 17/81  bone]
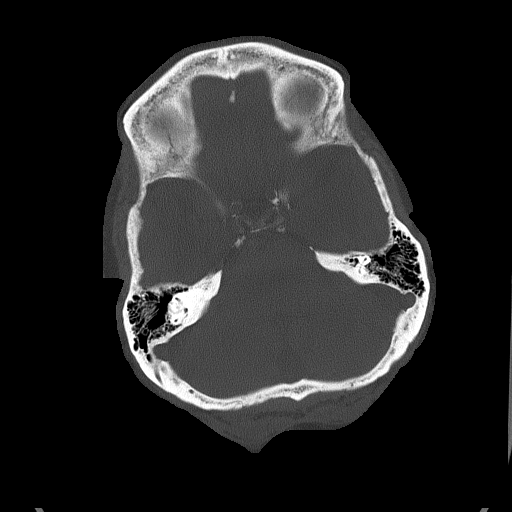

[Series 5: head without cor · coronal · non-contrast · 0.30mm/px · 3 of 67 slices shown]
[im 23/67  brain]
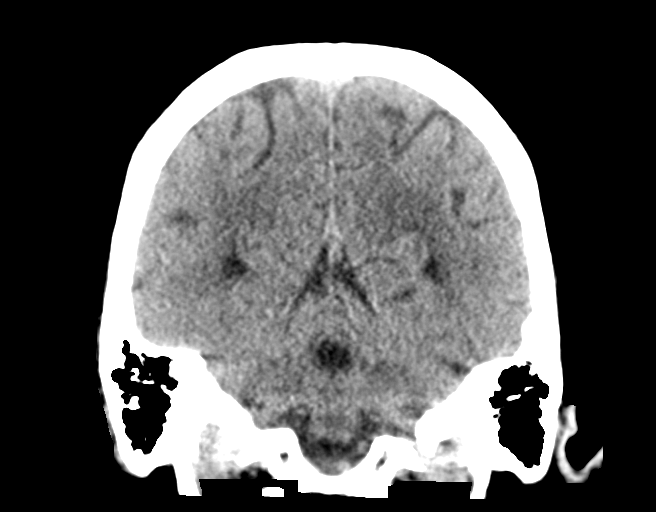
[im 30/67  brain]
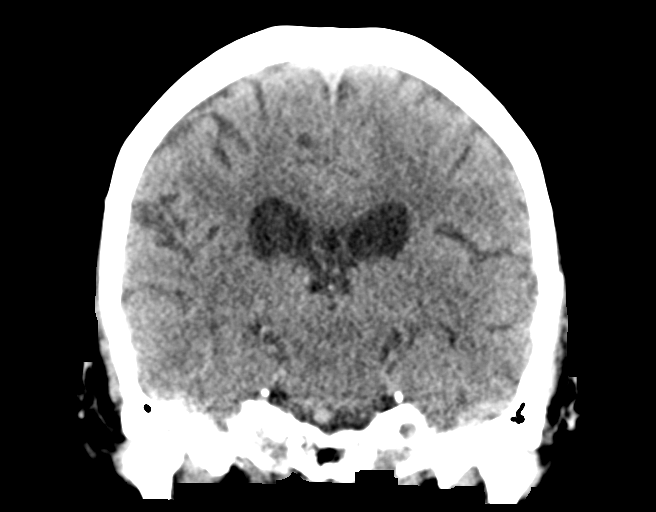
[im 37/67  brain]
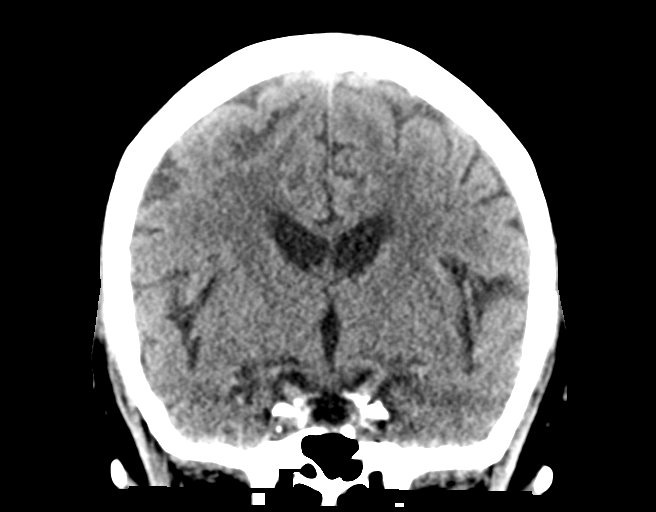

[Series 6: head without sag · sagittal · non-contrast · 0.29mm/px · 3 of 67 slices shown]
[im 23/67  brain]
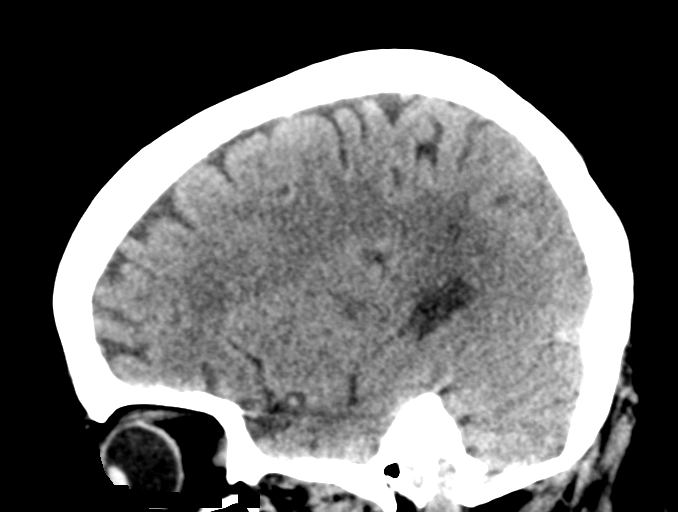
[im 34/67  brain]
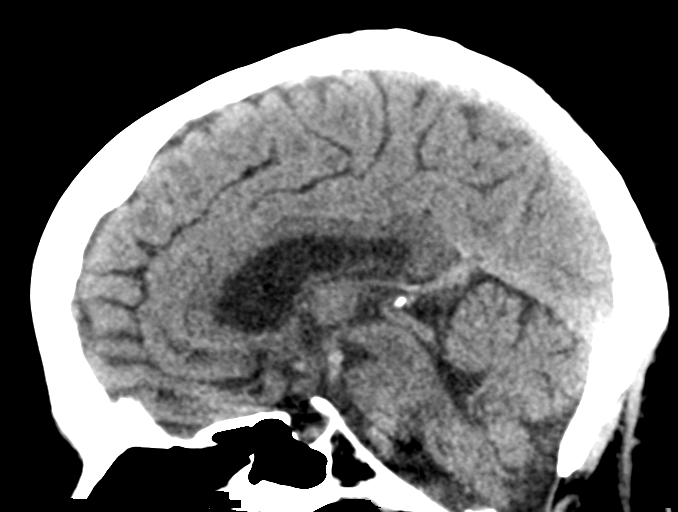
[im 45/67  brain]
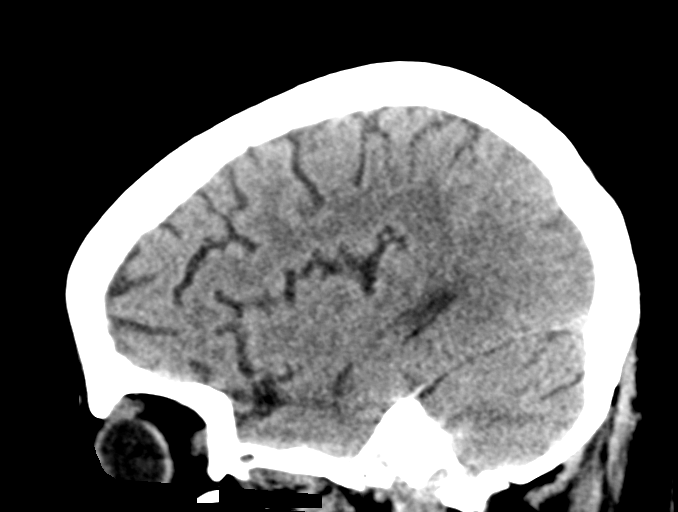

[15 of 47 positions shown; findings below may reference images not displayed]

FINDINGS: Brain:

Cerebral volume is normal for age.

Unchanged 6 mm acute/early subacute parenchymal hemorrhage within
the medial right cerebellar hemisphere with minimal surrounding
edema (series 3, image 10).

Mild to moderate patchy and ill-defined hypoattenuation within the
cerebral white matter, nonspecific but compatible with chronic small
vessel ischemic disease.

No demarcated cortical infarct.

No extra-axial fluid collection.

No evidence of intracranial mass.

No midline shift.

Vascular: No hyperdense vessel.  Atherosclerotic calcifications

Skull: Normal. Negative for fracture or focal lesion.

Sinuses/Orbits: Visualized orbits show no acute finding. Trace
bilateral ethmoid sinus mucosal thickening at the imaged levels.
IMPRESSION: Unchanged 6 mm acute/early subacute parenchymal hemorrhage within
the medial right cerebellar hemisphere with minimal surrounding
edema.

Stable mild-to-moderate cerebral white matter chronic small vessel
ischemic disease.

## 2020-07-22 MED ORDER — LOSARTAN POTASSIUM 50 MG PO TABS: 100.0000 mg | ORAL_TABLET | Freq: Every day | ORAL | 2 refills | Status: AC

## 2020-07-22 MED ORDER — LEVETIRACETAM 500 MG PO TABS: 500.0000 mg | ORAL_TABLET | Freq: Two times a day (BID) | ORAL | 2 refills | Status: AC

## 2020-07-22 MED ORDER — AMLODIPINE BESYLATE 10 MG PO TABS
10.0000 mg | ORAL_TABLET | Freq: Every day | ORAL | 2 refills | Status: AC
Start: 2020-07-23 — End: ?

## 2020-07-22 NOTE — Progress Notes (Signed)
SLP Cancellation Note  Patient Details Name: Emma Adams MRN: [id number] DOB: 08-03-1945   Cancelled treatment:       Reason Eval/Treat Not Completed: Other (comment) (per RN, patient getting ready to d/c. Defer eval to OP or HH if needed.)  Ferdinand Lango MA, CCC-SLP   Ardelia Wrede Meryl 07/22/2020, 2:25 PM

## 2020-07-22 NOTE — Progress Notes (Signed)
Physical Therapy Treatment Patient Details Name: Emma Adams MRN: [id number] DOB: 12-30-1945 Today's Date: 07/22/2020    History of Present Illness 75 y.o. female with PMH significant for HTN, HLD, DM2, hypothyroidism who is non compliant with medications who comes in to the ED 07/20/20 with 1 min seizure in the car. Pt passenger in car as traveling through [place] back to [place]. 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 MRi pending    PT Comments    Patient demonstrated significant loss of balance after turning while walking--required moderate assist AND catching herself with hand on the wall to prevent fall. She has struggled with imbalance and vertigo for many years, and admits it has been worse lately. Further vestibular assessment could not be completed prior to pt leaving for CT Head. Educated on use of RW and safest method for getting into tall truck as pt/RN expect pt to be discharged home today. Messaged LCSW re: need for RW (to obtain here prior to leaving for [place]) and HHPT (in [place]).    Follow Up Recommendations  Supervision for mobility/OOB;Home health PT     Equipment Recommendations  Rolling walker with 5" wheels (the one she had for her TKR is >68 years old and in ; should qualify for new RW)    Recommendations for Other Services       Precautions / Restrictions Precautions Precautions: Fall Precaution Comments: frequent LOB and falls PTA; h/o vertigo    Mobility  Bed Mobility Overal bed mobility: Independent Bed Mobility: Sit to Supine     Supine to sit: Supervision;Independent     General bed mobility comments: return to bed for CT head    Transfers Overall transfer level: Needs assistance Equipment used: 1 person hand held assist;Rolling walker (2 wheeled) Transfers: Sit to/from Stand Sit to Stand: Min guard         General transfer comment: vc for safe use of RW; steadying assist  without Rw  Ambulation/Gait Ambulation/Gait assistance: Mod assist;Min assist Gait Distance (Feet): 50 Feet (150) Assistive device: 1 person hand held assist;Rolling walker (2 wheeled) Gait Pattern/deviations: Step-through pattern;Decreased stride length;Scissoring;Staggering left;Drifts right/left;Narrow base of support     General Gait Details: initially with rt HHA with signficant LOB after turning requring mod assist and pt catching herself on the wall to prevent fall; with RW, minguard assist for safety with no imbalance; discussed pro's/con's of a rollator and feel it will not provide enough stability for pt; discussed having family hang laundry on outdoor line until HHPT can assess her safety with this task   Stairs             Wheelchair Mobility    Modified Rankin (Stroke Patients Only) Modified Rankin (Stroke Patients Only) Pre-Morbid Rankin Score: No significant disability Modified Rankin: Moderately severe disability     Balance Overall balance assessment: Needs assistance Sitting-balance support: No upper extremity supported;Feet supported Sitting balance-Leahy Scale: Good Sitting balance - Comments: donning socks with crossing legs at EOB   Standing balance support: Single extremity supported Standing balance-Leahy Scale: Poor Standing balance comment: posterior bias in standing                            Cognition Arousal/Alertness: Awake/alert Behavior During Therapy: Impulsive Overall Cognitive Status: Impaired/Different from baseline Area of Impairment: Attention;Awareness;Problem solving  Current Attention Level: Selective       Awareness: Intellectual Problem Solving: Requires verbal cues General Comments: Patient initially reporting she wouuld not use a RW as it will not roll over the grass to allow her to hang her laundry.      Exercises      General Comments General comments (skin integrity, edema,  etc.): Daughter and spouse present. Discussed using a folding step stool for pt to get up into tall truck with incr ease and safety. Demonstrated how to step up with back to seat of truck and sit FIRST then turn feet into truck--do NOT try to step into truck with one leg at a time and then sit.      Pertinent Vitals/Pain Pain Assessment: No/denies pain    Home Living                      Prior Function            PT Goals (current goals can now be found in the care plan section) Acute Rehab PT Goals Patient Stated Goal: return home in [place] PT Goal Formulation: With patient Time For Goal Achievement: 08/04/20 Potential to Achieve Goals: Good Progress towards PT goals: Progressing toward goals    Frequency    Min 4X/week      PT Plan Discharge plan needs to be updated;Equipment recommendations need to be updated    Co-evaluation              AM-PAC PT "6 Clicks" Mobility   Outcome Measure  Help needed turning from your back to your side while in a flat bed without using bedrails?: None Help needed moving from lying on your back to sitting on the side of a flat bed without using bedrails?: A Little Help needed moving to and from a bed to a chair (including a wheelchair)?: A Little Help needed standing up from a chair using your arms (e.g., wheelchair or bedside chair)?: A Little Help needed to walk in hospital room?: A Lot Help needed climbing 3-5 steps with a railing? : A Little 6 Click Score: 18    End of Session Equipment Utilized During Treatment: Gait belt Activity Tolerance: Patient tolerated treatment well Patient left: in bed;Other (comment) (with transporter going for head CT) Nurse Communication: Mobility status (needs RW prior to discharge; PT to contact LCSW) PT Visit Diagnosis: Unsteadiness on feet (R26.81);Repeated falls (R29.6);Muscle weakness (generalized) (M62.81);Dizziness and giddiness (R42)     Time: 0623-7628 PT Time Calculation  (min) (ACUTE ONLY): 28 min  Charges:  $Gait Training: 8-22 mins $Self Care/Home Management: 8-22                      Emma Adams, PT Pager [phone]    Emma Adams 07/22/2020, 12:29 PM

## 2020-07-22 NOTE — Discharge Summary (Signed)
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 no loss of bladder or bowel function.  Patient does not recall any warning symptoms.  Patient has poor recollection of the actual episode.  No prior history of CNS infection, no prior CNS surgery, no personal or family history of seizures.  No prior history of strokes the patient is aware of or family is aware of.  No history of ICH.  No history of alcohol use.  In the ED, she was noted to be postictal and appeared weak in her left leg and a stroke code was called.  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   HOSPITAL COURSE Emma Adams is a 75 y.o. female with history of hypertension, hyperlipidemia, diabetes admitted for seizure. No tPA given due to ICH.  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  Recommend no antiplatelet or anticoagulation given concern of CAA.  Patient counseled to be compliant with her antithrombotic medications  Ongoing aggressive stroke risk factor management  Therapy recommendations: Outpatient PT/OT  Disposition: home  ? 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 rate 19, height  (1.676 m), weight 92.5 kg, SpO2 90 %.  General - Well nourished, well developed, in no apparent distress.  Ophthalmologic - fundi not visualized due to noncooperation.  Cardiovascular - Regular rhythm and rate.  Mental Status -   Level of arousal and orientation to time, place, and person were intact. Language including expression, naming, repetition, comprehension was assessed and found intact.  Cranial Nerves II - XII - II - Visual field intact OU. III, IV, VI - Extraocular movements intact. V - Facial sensation intact bilaterally. VII - Facial movement intact bilaterally. VIII - Hearing & vestibular intact bilaterally. X - Palate elevates symmetrically. XI - Chin turning & shoulder shrug intact bilaterally. XII - Tongue protrusion intact.  Motor Strength - The patient's strength was normal in all extremities and pronator drift was absent.  Bulk was normal and fasciculations were absent.   Motor Tone - Muscle tone was assessed at the neck and appendages and was normal.  Reflexes - The patient's reflexes were symmetrical in all extremities and she had no pathological reflexes.  Sensory - Light touch, temperature/pinprick were assessed and were symmetrical.    Coordination - The patient had normal movements in the hands and feet with no ataxia or dysmetria.  Tremor was absent.  Gait and Station - deferred.  Discharge Diet       Diet   Diet regular Room service appropriate? Yes; Fluid consistency: Thin   liquids  DISCHARGE PLAN  Disposition:  home  Continue keppra for seizure prevention  Ongoing stroke risk factor control by Primary Care Physician at time of discharge  Follow-up PCP Means, Hipolito Bayley., DO in 1 week.  Follow-up with local neurologist in 2 weeks.  DC instructions: You were admitted for seizure, we put on medication called Keppra for seizure control. You need follow up with your local neurologist for further management.  You were also admitted for small bleeding in the back of your head. It was stable and you have been doing well with therapy. Please follow up with your local neurologist also The bleeding could be due to high blood pressure but we also concern you have a  condition called "cerebral amyloid angiopathy". For the latter, we do not want you to have any blood thinners unless necessary, such as Asprin.  Your blood pressure should be less than 140, please keep home blood pressure monitoring and follow up with your primary care doctor. We increased your cozaar to  twice a day and add new medication called amlodipine  daily. Please follow up with your primary care doctor in 1-2 weeks after discharge. Continue other medication as prescribed. Any new stroke like symptoms please call 911 for emergent evaluation.    40 minutes were spent preparing discharge.  Marvel Plan, MD PhD Stroke Neurology 07/22/2020 12:40 PM

## 2020-07-22 NOTE — Discharge Instructions (Signed)
You were admitted for seizure, we put on medication called Keppra for seizure control. You need follow up with your local neurologist for further management.  You were also admitted for small bleeding in the back of your head. It was stable and you have been doing well with therapy. Please follow up with your local neurologist also The bleeding could be due to high blood pressure but we also concern you have a condition called "cerebral amyloid angiopathy". For the latter, we do not want you to have any blood thinners unless necessary, such as Asprin.  Your blood pressure should be less than 140, please keep home blood pressure monitoring and follow up with your primary care doctor. We increased your cozaar to 50mg  twice a day and add new medication called amlodipine 10mg  daily. Please follow up with your primary care doctor in 1-2 weeks after discharge. Continue other medication as prescribed. Any new stroke like symptoms please call 911 for emergent evaluation.     Seizure, Adult A seizure is a sudden burst of abnormal electrical and chemical activity in the brain. Seizures usually last from 30 seconds to 2 minutes.  What are the causes? Common causes of this condition include:  Fever or infection.  Problems that affect the brain. These may include: ? A brain or head injury. ? Bleeding in the brain. ? A brain tumor.  Low levels of blood sugar or salt.  Kidney problems or liver problems.  Conditions that are passed from parent to child (are inherited).  Problems with a substance, such as: ? Having a reaction to a drug or a medicine. ? Stopping the use of a substance all of a sudden (withdrawal).  A stroke.  Disorders that affect how you develop. Sometimes, the cause may not be known.  What increases the risk?  Having someone in your family who has epilepsy. In this condition, seizures happen again and again over time. They have no clear cause.  Having had a tonic-clonic  seizure before. This type of seizure causes you to: ? Tighten the muscles of the whole body. ? Lose consciousness.  Having had a head injury or strokes before.  Having had a lack of oxygen at birth. What are the signs or symptoms? There are many types of seizures. The symptoms vary depending on the type of seizure you have. Symptoms during a seizure  Shaking that you cannot control (convulsions) with fast, jerky movements of muscles.  Stiffness of the body.  Breathing problems.  Feeling mixed up (confused).  Staring or not responding to sound or touch.  Head nodding.  Eyes that blink, flutter, or move fast.  Drooling, grunting, or making clicking sounds with your mouth  Losing control of when you pee or poop. Symptoms before a seizure  Feeling afraid, nervous, or worried.  Feeling like you may vomit.  Feeling like: ? You are moving when you are not. ? Things around you are moving when they are not.  Feeling like you saw or heard something before (dj vu).  Odd tastes or smells.  Changes in how you see. You may see flashing lights or spots. Symptoms after a seizure  Feeling confused.  Feeling sleepy.  Headache.  Sore muscles. How is this treated? If your seizure stops on its own, you will not need treatment. If your seizure lasts longer than 5 minutes, you will normally need treatment. Treatment may include:  Medicines given through an IV tube.  Avoiding things, such as medicines, that are known  to cause your seizures.  Medicines to prevent seizures.  A device to prevent or control seizures.  Surgery.  A diet low in carbohydrates and high in fat (ketogenic diet). Follow these instructions at home: Medicines  Take over-the-counter and prescription medicines only as told by your doctor.  Avoid foods or drinks that may keep your medicine from working, such as alcohol. Activity  Follow instructions about driving, swimming, or doing things that  would be dangerous if you had another seizure. Wait until your doctor says it is safe for you to do these things.  If you live in the [place] ask your local department of motor vehicles when you can drive.  Get a lot of rest. Teaching others  Teach friends and family what to do when you have a seizure. They should: ? Help you get down to the ground. ? Protect your head and body. ? Loosen any clothing around your neck. ? Turn you on your side. ? Know whether or not you need emergency care. ? Stay with you until you are better.  Also, tell them what not to do if you have a seizure. Tell them: ? They should not hold you down. ? They should not put anything in your mouth.   General instructions  Avoid anything that gives you seizures.  Keep a seizure diary. Write down: ? What you remember about each seizure. ? What you think caused each seizure.  Keep all follow-up visits. Contact a doctor if:  You have another seizure or seizures. Call the doctor each time you have a seizure.  The pattern of your seizures changes.  You keep having seizures with treatment.  You have symptoms of being sick or having an infection.  You are not able to take your medicine. Get help right away if:  You have any of these problems: ? A seizure that lasts longer than 5 minutes. ? Many seizures in a row and you do not feel better between seizures. ? A seizure that makes it harder to breathe. ? A seizure and you can no longer speak or use part of your body.  You do not wake up right after a seizure.  You get hurt during a seizure.  You feel confused or have pain right after a seizure. These symptoms may be an emergency. Get help right away. Call your local emergency services (911 in the [place]  Do not wait to see if the symptoms will go away.  Do not drive yourself to the hospital. Summary  A seizure is a sudden burst of abnormal electrical and chemical activity in the brain. Seizures  normally last from 30 seconds to 2 minutes.  Causes of seizures include illness, injury to the head, low levels of blood sugar or salt, and certain conditions.  Most seizures will stop on their own in less than 5 minutes. Seizures that last longer than 5 minutes are a medical emergency and need treatment right away.  Many medicines are used to treat seizures. Take over-the-counter and prescription medicines only as told by your doctor. This information is not intended to replace advice given to you by your health care provider. Make sure you discuss any questions you have with your health care provider. Document Revised: 09/02/2019 Document Reviewed: 09/02/2019 Elsevier Patient Education  2021 Elsevier Inc[employer]   Hypertension, Adult High blood pressure (hypertension) is when the force of blood pumping through the arteries is too strong. The arteries are the blood vessels that carry blood from the  heart throughout the body. Hypertension forces the heart to work harder to pump blood and may cause arteries to become narrow or stiff. Untreated or uncontrolled hypertension can cause a heart attack, heart failure, a stroke, kidney disease, and other problems. A blood pressure reading consists of a higher number over a lower number. Ideally, your blood pressure should be below 120/80. The first ("top") number is called the systolic pressure. It is a measure of the pressure in your arteries as your heart beats. The second ("bottom") number is called the diastolic pressure. It is a measure of the pressure in your arteries as the heart relaxes. What are the causes? The exact cause of this condition is not known. There are some conditions that result in or are related to high blood pressure. What increases the risk? Some risk factors for high blood pressure are under your control. The following factors may make you more likely to develop this condition:  Smoking.  Having type 2 diabetes mellitus, high  cholesterol, or both.  Not getting enough exercise or physical activity.  Being overweight.  Having too much fat, sugar, calories, or salt (sodium) in your diet.  Drinking too much alcohol. Some risk factors for high blood pressure may be difficult or impossible to change. Some of these factors include:  Having chronic kidney disease.  Having a family history of high blood pressure.  Age. Risk increases with age.  Race. You may be at higher risk if you are African American.  Gender. Men are at higher risk than women before age 76. After age 43, women are at higher risk than men.  Having obstructive sleep apnea.  Stress. What are the signs or symptoms? High blood pressure may not cause symptoms. Very high blood pressure (hypertensive crisis) may cause:  Headache.  Anxiety.  Shortness of breath.  Nosebleed.  Nausea and vomiting.  Vision changes.  Severe chest pain.  Seizures. How is this diagnosed? This condition is diagnosed by measuring your blood pressure while you are seated, with your arm resting on a flat surface, your legs uncrossed, and your feet flat on the floor. The cuff of the blood pressure monitor will be placed directly against the skin of your upper arm at the level of your heart. It should be measured at least twice using the same arm. Certain conditions can cause a difference in blood pressure between your right and left arms. Certain factors can cause blood pressure readings to be lower or higher than normal for a short period of time:  When your blood pressure is higher when you are in a health care provider's office than when you are at home, this is called white coat hypertension. Most people with this condition do not need medicines.  When your blood pressure is higher at home than when you are in a health care provider's office, this is called masked hypertension. Most people with this condition may need medicines to control blood pressure. If  you have a high blood pressure reading during one visit or you have normal blood pressure with other risk factors, you may be asked to:  Return on a different day to have your blood pressure checked again.  Monitor your blood pressure at home for 1 week or longer. If you are diagnosed with hypertension, you may have other blood or imaging tests to help your health care provider understand your overall risk for other conditions. How is this treated? This condition is treated by making healthy lifestyle changes, such  as eating healthy foods, exercising more, and reducing your alcohol intake. Your health care provider may prescribe medicine if lifestyle changes are not enough to get your blood pressure under control, and if:  Your systolic blood pressure is above 130.  Your diastolic blood pressure is above 80. Your personal target blood pressure may vary depending on your medical conditions, your age, and other factors. Follow these instructions at home: Eating and drinking  Eat a diet that is high in fiber and potassium, and low in sodium, added sugar, and fat. An example eating plan is called the DASH (Dietary Approaches to Stop Hypertension) diet. To eat this way: ? Eat plenty of fresh fruits and vegetables. Try to fill one half of your plate at each meal with fruits and vegetables. ? Eat whole grains, such as whole-wheat pasta, brown rice, or whole-grain bread. Fill about one fourth of your plate with whole grains. ? Eat or drink low-fat dairy products, such as skim milk or low-fat yogurt. ? Avoid fatty cuts of meat, processed or cured meats, and poultry with skin. Fill about one fourth of your plate with lean proteins, such as fish, chicken without skin, beans, eggs, or tofu. ? Avoid pre-made and processed foods. These tend to be higher in sodium, added sugar, and fat.  Reduce your daily sodium intake. Most people with hypertension should eat less than 1,500 mg of sodium a day.  Do not  drink alcohol if: ? Your health care provider tells you not to drink. ? You are pregnant, may be pregnant, or are planning to become pregnant.  If you drink alcohol: ? Limit how much you use to:  0-1 drink a day for women.  0-2 drinks a day for men. ? Be aware of how much alcohol is in your drink. In the [place] one drink equals one 12 oz bottle of beer (355 mL), one 5 oz glass of wine (148 mL), or one 1 oz glass of hard liquor (44 mL).   Lifestyle  Work with your health care provider to maintain a healthy body weight or to lose weight. Ask what an ideal weight is for you.  Get at least 30 minutes of exercise most days of the week. Activities may include walking, swimming, or biking.  Include exercise to strengthen your muscles (resistance exercise), such as Pilates or lifting weights, as part of your weekly exercise routine. Try to do these types of exercises for 30 minutes at least 3 days a week.  Do not use any products that contain nicotine or tobacco, such as cigarettes, e-cigarettes, and chewing tobacco. If you need help quitting, ask your health care provider.  Monitor your blood pressure at home as told by your health care provider.  Keep all follow-up visits as told by your health care provider. This is important.   Medicines  Take over-the-counter and prescription medicines only as told by your health care provider. Follow directions carefully. Blood pressure medicines must be taken as prescribed.  Do not skip doses of blood pressure medicine. Doing this puts you at risk for problems and can make the medicine less effective.  Ask your health care provider about side effects or reactions to medicines that you should watch for. Contact a health care provider if you:  Think you are having a reaction to a medicine you are taking.  Have headaches that keep coming back (recurring).  Feel dizzy.  Have swelling in your ankles.  Have trouble with your vision. Get help right  away if you:  Develop a severe headache or confusion.  Have unusual weakness or numbness.  Feel faint.  Have severe pain in your chest or abdomen.  Vomit repeatedly.  Have trouble breathing. Summary  Hypertension is when the force of blood pumping through your arteries is too strong. If this condition is not controlled, it may put you at risk for serious complications.  Your personal target blood pressure may vary depending on your medical conditions, your age, and other factors. For most people, a normal blood pressure is less than 120/80.  Hypertension is treated with lifestyle changes, medicines, or a combination of both. Lifestyle changes include losing weight, eating a healthy, low-sodium diet, exercising more, and limiting alcohol. This information is not intended to replace advice given to you by your health care provider. Make sure you discuss any questions you have with your health care provider. Document Revised: 11/04/2017 Document Reviewed: 11/04/2017 Elsevier Patient Education  2021 [employer].

## 2020-07-22 NOTE — TOC Transition Note (Signed)
Transition of Care [facility]) - CM/SW Discharge Note   Patient Details  Name: Emma Adams MRN: [id number] Date of Birth: [birth year]  [facility] of [facility] CM/SW Contact:  Lawerance Sabal, RN Phone Number: 07/22/2020, 1:34 PM   Clinical Narrative:    Spoke to patient, spouse, and daughter at bedside.  RW ordered, all other DME declined. RW to be delivered to room prior to DC. Patient to DC back to [place]. Discussed setting up HH, vs OP. They would prefer OP therapy and will set it up through their PCP tomorrow.  Discussed driving restrictions and patient will have transportation to OP appointments and prefers these as a means to get out of the house during her period of driving restriction 2/2 seizure.  Requested nurse to obtain CD w imaging as suggested by Dr Roda Shutters for them to take home with them. No other CM needs.     Final next level of care: Home/Self Care Barriers to Discharge: No Barriers Identified   Patient Goals and CMS Choice Patient states their goals for this hospitalization and ongoing recovery are:: to go home CMS Medicare.gov Compare Post Acute Care list provided to:: Patient Choice offered to / list presented to : Patient,Spouse,Adult Children  Discharge Placement                       Discharge Plan and Services                DME Arranged: Dan Humphreys rolling DME Agency: [employer] Date DME Agency Contacted: 07/22/20 Time DME Agency Contacted: [phone] Representative spoke with at DME Agency: Leavy Cella            Social Determinants of Health (SDOH) Interventions     Readmission Risk Interventions No flowsheet data found.

## 2020-07-25 ENCOUNTER — Ambulatory Visit (INDEPENDENT_AMBULATORY_CARE_PROVIDER_SITE_OTHER): Payer: Medicare PPO | Admitting: Family Medicine

## 2020-07-25 ENCOUNTER — Other Ambulatory Visit: Payer: Self-pay

## 2020-07-25 ENCOUNTER — Encounter (INDEPENDENT_AMBULATORY_CARE_PROVIDER_SITE_OTHER): Payer: Self-pay | Admitting: Family Medicine

## 2020-07-25 VITALS — BP 148/82 | HR 84 | Temp 95.3°F | Resp 16 | Ht 66.5 in | Wt 210.4 lb

## 2020-07-25 DIAGNOSIS — M5416 Radiculopathy, lumbar region: Secondary | ICD-10-CM

## 2020-07-25 DIAGNOSIS — Z09 Encounter for follow-up examination after completed treatment for conditions other than malignant neoplasm: Secondary | ICD-10-CM

## 2020-07-25 DIAGNOSIS — I619 Nontraumatic intracerebral hemorrhage, unspecified: Secondary | ICD-10-CM

## 2020-07-25 DIAGNOSIS — I10 Essential (primary) hypertension: Secondary | ICD-10-CM

## 2020-07-25 DIAGNOSIS — R569 Unspecified convulsions: Secondary | ICD-10-CM

## 2020-07-25 MED ORDER — TRAMADOL 50 MG TABLET
1.0000 | ORAL_TABLET | Freq: Two times a day (BID) | ORAL | 0 refills | Status: DC | PRN
Start: 2020-07-25 — End: 2020-07-27

## 2020-07-25 NOTE — Progress Notes (Signed)
OUTPATIENT PROGRESS NOTE    Subjective:   Patient ID:  Linda [name] is a pleasant 75 y.o. female.    Chief Complaint: Hospital Follow Up      History of Present Illness:  Pt is presenting today with her daughter,  for a hospital discharge follow up. Pt was in [place] when she had experienced 1 episode of seizure, loss of consciousness.  Patient does not have a PMH of seizure disorder.  Her daughter took her to a local hospital where they worked her up and admitted her to the ICU for new onset seizure and also hemorrhage of cerebellum with loss of consciousness.  Patient also admits that she had for gone her blood pressure medication during this trip so she was not taking her meds for couple days when she got to the ER her blood pressure was elevated.  Hospital workup included MRA head, head CT with and without contrast, MRI of brain without contrast, EEG, echocardiogram, and carotid Dopplers- please refer to hospital note on 5/14 for full details.   She was discharged in stable condition on Keppra for seizure prevention, DC aspirin blood pressure goals of systolic less than 140, she is now taking Cozaar to 50 mg b.i.d. and amlodipine 10 mg q.d..  She is to follow up with Neurology in 2 weeks.    Today, patient says she feels fine.  She still has no reccollection of the event.  She does been having periods of slight confusion but is overall okay.  She is unsteady on her feet and does use a walker to ambulate however I am unsure if this is a new symptom as she has had the walker previous.  They did do a speech eval in the hospital and she passed.    No other concerns at this time    In general, patient denies fever, chills, headache, dizziness, blurry vision, tinnitus, dyspnea, palpitations, chest pain/tightness/pressure, abdominal pain, changes in bowel and/or bladder habits, changes in appetite or weight.       The history is provided by the patient.      Allergies:   No Known Allergies      Medications:   acetaminophen  (TYLENOL) 500 mg Oral Tablet, Take 500 mg by mouth Every 4 hours as needed for Pain  amLODIPine (NORVASC) 10 mg Oral Tablet, Take 10 mg by mouth  Blood Sugar Diagnostic (CONTOUR NEXT TEST STRIPS) Strip, check BS twice a day  cholecalciferol, vitamin D3, 1,250 mcg (50,000 unit) Oral Capsule, Take 1 Cap (50,000 Units total) by mouth Every 7 days  Diclofenac Sodium 3 % Gel, Apply topically  glimepiride (AMARYL) 1 mg Oral Tablet, Take 1 mg by mouth Every morning with breakfast  Ibuprofen (MOTRIN) 800 mg Oral Tablet, take 1 tablet by mouth three times a day (Patient not taking: Reported on 07/25/2020)  JANUMET 50-1,000 mg Oral Tablet, take 1 tablet by mouth twice a day with food  levETIRAcetam (KEPPRA) 500 mg Oral Tablet, Take 500 mg by mouth  levothyroxine (SYNTHROID) 150 mcg Oral Tablet, Take 1 Tablet (150 mcg total) by mouth Every morning  losartan (COZAAR) 50 mg Oral Tablet, take 1 tablet by mouth once daily  losartan (COZAAR) 50 mg Oral Tablet, Take 100 mg by mouth  meclizine (ANTIVERT) 12.5 mg Oral Tablet, Take 1 Tablet (12.5 mg total) by mouth Every 8 hours as needed for Dizziness  pravastatin (PRAVACHOL) 80 mg Oral Tablet, take 1 tablet by mouth once daily    No facility-administered medications prior  to visit.        Immunization History:     Immunization History   Administered Date(s) Administered   . Influenza Vaccine, 6 month-adult 12/19/2016, 01/12/2020   . PREVNAR 13 (ADMIN) 12/19/2016         Past Medical History:     Past Medical History:   Diagnosis Date   . Chronic pain of right ankle 12/19/2016   . Essential hypertension, benign 12/17/2016   . Hypothyroidism, adult 12/17/2016   . Mixed hyperlipidemia 12/17/2016   . Obstructive sleep apnea 12/17/2016   . Type 2 diabetes mellitus without complication, without long-term current use of insulin (CMS HCC) 12/17/2016             Past Surgical History:     Past Surgical History:   Procedure Laterality Date   . HX APPENDECTOMY     . HX BACK SURGERY     .  REPLACEMENT TOTAL KNEE     . SHOULDER SURGERY     . VEIN LIGATION               Family History:     Family Medical History:     Problem Relation (Age of Onset)    Leukemia Mother    Lymphoma Father              Social History:   Linda [name]  reports that she has never smoked. She has never used smokeless tobacco. She reports that she does not drink alcohol.          Review of Systems: Other than ROS in HPI, all other systems are negative.      Objective:   Vitals:    Vitals:    07/25/20 0856   BP: (!) 148/82   Pulse: 84   Resp: 16   Temp: 35.2 C (95.3 F)   SpO2: 98%   Weight: 95.4 kg (210 lb 6.4 oz)   Height: 1.689 m (5' 6.5")   BMI: 33.52          Body mass index is 33.45 kg/m.      Constitutional: Alert, well developed, well nourished  HEENT:  Head: NC/AT    Eyes: Sclera anicteric, conjunctiva not injected  Neck:   Supple with normal ROM, no cervical LAD, no thyromegaly, no JVD, no carotid bruits  Cardiovascular: RRR, normal S1/S2, no murmurs/rubs/gallops  Pulmonary:  CTAB, equal air entry, nonlabored, no wheezes/crackles/rhonchi  Abdomen:   NABS, NT/ND, soft, no HSM, no masses  Musculoskeletal:  No deformity, no injury, no edema  Neurological:   Alert, oriented x 3, no abnormal tone  Skin:     Warm, pink, dry, no rashes, no jaundice, no pallor, no cyanosis  Psychiatric:  Normal mood, affect, behavior, judgment, and thought content        Assessment & Plan:      2 ICD-10-CM    1. Hospital discharge follow-up  Z09    2. Seizure (CMS HCC)  R56.9 Refer to [facility]     PHYSICAL THERAPY OUTPATIENT EXTERNAL REFERRAL     CANCELED: PHYSICAL THERAPY OUTPATIENT EXTERNAL REFERRAL   3. Stroke, hemorrhagic (CMS HCC)  I61.9 PHYSICAL THERAPY OUTPATIENT EXTERNAL REFERRAL     CANCELED: PHYSICAL THERAPY OUTPATIENT EXTERNAL REFERRAL   4. Essential hypertension, benign  I10    5. Lumbar radiculopathy  M54.16    1.  Hospital discharge follow-up  I reviewed all documentation available to me.  This included ER  notes, hospital notes, labs,  imaging, discharge instructions.  I reviewed these with the patient and her daughter.  Answered all questions.  They verbalized understanding.    2. Seizure  Patient started on Keppra.  She is doing well with that.  We will give her the referral for Dr. Verdie Mosher with Neurology.    3. Hemorrhagic stroke  Patient is currently off aspirin.  Assessment is stable.  She will follow-up with Dr. Reynolds Bowl neurology.  I will also provide her with a PT/OT eval.    4. Essential hypertension  Systolic goal is below 140.  She is now on the Cozaar 50 mg b.i.d. as well as amlodipine 10 mg. No complaints of ankle swelling at the moment.  Reminded patient to avoid salt.    5. Lumbar radiculopathy  Patient does have pain of her back, knee, and ankle.  She does follow with orthopedics for injections.  Prior to her event, she was taking a lot of aspirin and NSAIDs.  I will provider with a 1 time script for tramadol to help with her discomfort until she can get in with Orthopedics.    PDMP    I have reviewed the patients controlled substance prescription history from the PDMP and my orders/prescriptions reflect my decisions based on the patients condition, reason for care, my evaluation, and PDMP data.    Discussed plan of care with patient, answered all questions.  Patient verbalized understanding.      On the day of the encounter, a total of 60 minutes was spent on this patient encounter including review of historical information, examination, documentation and post-visit activities.               Health Maintenance   Topic Date Due   . Diabetic Retinal Exam  Never done   . Osteoporosis screening  Never done   . Hepatitis C screening  Never done   . Covid-19 Vaccine (1) Never done   . Adult Tdap-Td (1 - Tdap) Never done   . Mammography  Never done   . Shingles Vaccine (1 of 2) Never done   . Depression Screening  05/30/2020   . Annual Wellness Visit  05/30/2020   . Diabetes A1C  12/11/2020   . Pneumococcal  Vaccination, Age 43+ (1 of 1 - PPSV23) 12/19/2021   . Colonoscopy  07/02/2025   . Influenza Vaccine  Completed   . Meningococcal Vaccine  Aged Out       Return in about 4 weeks (around 08/22/2020) for With Dr. Margo Aye.  I will be out of town and unable to do follow-up during this time period.  She also has a routine follow-up with Dr. Charlestine Night on 7 6/22    Danford Bad, APRN,NP-C

## 2020-07-25 NOTE — Nursing Note (Signed)
Pt being seen today to hospital f/u from [facility] for seizure and stroke
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
## 2020-07-27 ENCOUNTER — Ambulatory Visit: Payer: Medicare PPO | Attending: Family Medicine | Admitting: Neurology

## 2020-07-27 ENCOUNTER — Other Ambulatory Visit: Payer: Self-pay

## 2020-07-27 ENCOUNTER — Encounter (INDEPENDENT_AMBULATORY_CARE_PROVIDER_SITE_OTHER): Payer: Self-pay | Admitting: Neurology

## 2020-07-27 VITALS — BP 161/87 | HR 79 | Ht 66.0 in | Wt 211.0 lb

## 2020-07-27 DIAGNOSIS — E854 Organ-limited amyloidosis: Secondary | ICD-10-CM | POA: Insufficient documentation

## 2020-07-27 DIAGNOSIS — R569 Unspecified convulsions: Secondary | ICD-10-CM | POA: Insufficient documentation

## 2020-07-27 DIAGNOSIS — I680 Cerebral amyloid angiopathy: Secondary | ICD-10-CM | POA: Insufficient documentation

## 2020-07-27 DIAGNOSIS — I614 Nontraumatic intracerebral hemorrhage in cerebellum: Secondary | ICD-10-CM | POA: Insufficient documentation

## 2020-07-27 MED ORDER — PROPRANOLOL 10 MG TABLET
10.0000 mg | ORAL_TABLET | Freq: Two times a day (BID) | ORAL | 2 refills | Status: DC
Start: 2020-07-27 — End: 2020-10-23

## 2020-07-27 NOTE — Nursing Note (Signed)
New patient here to discuss seizures

## 2020-08-22 ENCOUNTER — Encounter (INDEPENDENT_AMBULATORY_CARE_PROVIDER_SITE_OTHER): Payer: Self-pay | Admitting: Family

## 2020-08-22 ENCOUNTER — Other Ambulatory Visit: Payer: Self-pay

## 2020-08-22 ENCOUNTER — Ambulatory Visit (INDEPENDENT_AMBULATORY_CARE_PROVIDER_SITE_OTHER): Payer: Medicare Other | Admitting: Family

## 2020-08-22 VITALS — BP 124/84 | HR 73 | Temp 96.8°F | Resp 16 | Ht 66.0 in | Wt 207.4 lb

## 2020-08-22 DIAGNOSIS — I10 Essential (primary) hypertension: Secondary | ICD-10-CM

## 2020-08-22 MED ORDER — LOSARTAN 100 MG-HYDROCHLOROTHIAZIDE 12.5 MG TABLET
1.0000 | ORAL_TABLET | Freq: Every day | ORAL | 2 refills | Status: DC
Start: 2020-08-22 — End: 2020-09-12

## 2020-08-22 NOTE — Progress Notes (Signed)
OUTPATIENT PROGRESS NOTE    Subjective:   Patient ID:  Linda [name] is a pleasant 75 y.o. female.    Chief Complaint: Follow Up      History of Present Illness:  HTN: HAs No    Dizziness No   Numbness/tingling No   Feeling like BP is too high or low No   Compliant with taking meds Yes   Home BP:  usual 120-140/70's              Any chest pain or shortness of breath No              Any side effects to medication No      The history is provided by the patient.      Allergies:   No Known Allergies      Medications:   acetaminophen (TYLENOL) 500 mg Oral Tablet, Take 500 mg by mouth Every 4 hours as needed for Pain  amLODIPine (NORVASC) 10 mg Oral Tablet, Take 10 mg by mouth  Blood Sugar Diagnostic (CONTOUR NEXT TEST STRIPS) Strip, check BS twice a day  cholecalciferol, vitamin D3, 1,250 mcg (50,000 unit) Oral Capsule, Take 1 Cap (50,000 Units total) by mouth Every 7 days  Diclofenac Sodium 3 % Gel, Apply topically  glimepiride (AMARYL) 1 mg Oral Tablet, Take 1 mg by mouth Every morning with breakfast  JANUMET 50-1,000 mg Oral Tablet, take 1 tablet by mouth twice a day with food  levETIRAcetam (KEPPRA) 500 mg Oral Tablet, Take 500 mg by mouth  levothyroxine (SYNTHROID) 150 mcg Oral Tablet, Take 1 Tablet (150 mcg total) by mouth Every morning  pravastatin (PRAVACHOL) 80 mg Oral Tablet, take 1 tablet by mouth once daily  propranoloL (INDERAL) 10 mg Oral Tablet, Take 1 Tablet (10 mg total) by mouth Twice daily  losartan (COZAAR) 50 mg Oral Tablet, take 1 tablet by mouth once daily  losartan (COZAAR) 50 mg Oral Tablet, Take 100 mg by mouth  meclizine (ANTIVERT) 12.5 mg Oral Tablet, Take 1 Tablet (12.5 mg total) by mouth Every 8 hours as needed for Dizziness (Patient not taking: Reported on 07/27/2020)    No facility-administered medications prior to visit.        Immunization History:     Immunization History   Administered Date(s) Administered   . Influenza Vaccine, 6 month-adult 12/19/2016, 01/12/2020   . PREVNAR 13 (ADMIN)  12/19/2016         Past Medical History:     Past Medical History:   Diagnosis Date   . Chronic pain of right ankle 12/19/2016   . Essential hypertension, benign 12/17/2016   . Hypothyroidism, adult 12/17/2016   . Mixed hyperlipidemia 12/17/2016   . Obstructive sleep apnea 12/17/2016   . Seizure (CMS HCC)    . Stroke (CMS [facility])    . Type 2 diabetes mellitus without complication, without long-term current use of insulin (CMS HCC) 12/17/2016             Past Surgical History:     Past Surgical History:   Procedure Laterality Date   . HX APPENDECTOMY     . HX BACK SURGERY     . REPLACEMENT TOTAL KNEE     . SHOULDER SURGERY     . VEIN LIGATION               Family History:     Family Medical History:     Problem Relation (Age of Onset)    Leukemia Mother  Lymphoma Father              Social History:   Carrigan Delafuente  reports that she has never smoked. She has never used smokeless tobacco. She reports that she does not drink alcohol and does not use drugs.          Review of Systems: Other than ROS in HPI, all other systems are negative.      Objective:   Vitals:    Vitals:    08/22/20 0854   BP: 124/84   Pulse: 73   Resp: 16   Temp: 36 C (96.8 F)   SpO2: 97%   Weight: 94.1 kg (207 lb 6.4 oz)   Height: 1.676 m (5\' 6" )   BMI: 33.55          Body mass index is 33.48 kg/m.      Constitutional: Alert, well developed, well nourished  HEENT:  Head: NC/AT    Eyes: Sclera anicteric, conjunctiva not injected    Ears: EAC normal, bilateral TMs clear    Nose: No discharge    Throat: MMM, posterior pharynx without erythema or exudate  Neck:   Supple with normal ROM, no cervical LAD, no thyromegaly, no JVD, no carotid bruits  Cardiovascular: RRR, normal S1/S2, no murmurs/rubs/gallops  Pulmonary:  CTAB, equal air entry, nonlabored, no wheezes/crackles/rhonchi  Abdomen:   NABS, NT/ND, soft, no HSM, no masses  Musculoskeletal:  No deformity, no injury, no edema  Neurological:   Alert, oriented x 3, no abnormal tone  Skin:     Warm,  pink, dry, no rashes, no jaundice, no pallor, no cyanosis  Psychiatric:  Normal mood, affect, behavior, judgment, and thought content        Assessment & Plan:     Haadiya was seen today for follow up.    Diagnoses and all orders for this visit:    1. Essential hypertension, benign (Primary)    Other orders  -     losartan-hydrochlorothiazide (HYZAAR) 100-12.5 mg Oral Tablet; Take 1 Tablet by mouth Once a day    Instructed patient to check blood pressure twice a day and record and follow up in office for 1 week for BP check           Health Maintenance   Topic Date Due   . Diabetic Retinal Exam  Never done   . Osteoporosis screening  Never done   . Hepatitis C screening  Never done   . Covid-19 Vaccine (1) Never done   . Adult Tdap-Td (1 - Tdap) Never done   . Mammography  Never done   . Shingles Vaccine (1 of 2) Never done   . Pneumococcal Vaccination, Age 57+ (2 - PPSV23 or PCV20) 12/19/2017   . Annual Wellness Visit - Calendar Year Insurers  03/10/2020   . Depression Screening  05/30/2020   . Diabetes A1C  12/11/2020   . Colonoscopy  07/02/2025   . Influenza Vaccine  Completed   . Meningococcal Vaccine  Aged Out       Return in about 3 months (around 11/22/2020).    The patient has been educated and verbalized understanding regarding the services provided during this visit.      11/24/2020, APRN,NP-C

## 2020-08-22 NOTE — Nursing Note (Signed)
Pt being seen today for a f/u

## 2020-09-11 NOTE — Progress Notes (Signed)
OUTPATIENT PROGRESS NOTE    Subjective:   Patient ID:  Linda [name] is a pleasant 75 y.o. female.    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 She is recovering and she is doing PT and her balance has been slowly. Her BP has been in the 120 to 130 range with her present medications. Her headaches are better with the inderal. She denies any cp or sob.   DM: Fasting FS range up 200 one time   Nonfasting FS range not checking   Hypoglycemia episodes  No   Compliant with diabetic diet Yes   Sores on feet No  Diabetes Monitors  A1C: 6.7  A1C Date: 06/11/2020           Nephropathy Screening: On ACEI or ARB    Last Lipid Panel  (Last result in the past 2 years)      Cholesterol   HDL   LDL   Direct LDL   Triglycerides      06/11/20 1034 201   45   116     199          Retinal Exam Date: Not Found  Last diabetic foot exam: Not Found  HTN: HAs No    Dizziness No   Feeling like BP is too high or low yes    Compliant with taking meds Yes   Home BP:  not doing  HLD: tolerating cholosterol lowering medication     "healthy" diet  in general   Is pt fasting today? No      The history is provided by the patient.      Allergies:   No Known Allergies      Medications:   acetaminophen (TYLENOL) 500 mg Oral Tablet, Take 500 mg by mouth Every 4 hours as needed for Pain  amLODIPine (NORVASC) 10 mg Oral Tablet, Take 10 mg by mouth  Blood Sugar Diagnostic (CONTOUR NEXT TEST STRIPS) Strip, check BS twice a day  cholecalciferol, vitamin D3, 1,250 mcg (50,000 unit) Oral Capsule, Take 1 Cap (50,000 Units total) by mouth Every 7 days (Patient not taking: Reported on 09/12/2020)  Diclofenac Sodium 3 % Gel, Apply topically  glimepiride (AMARYL) 1 mg Oral Tablet, Take 1 mg by mouth Every morning with  breakfast  levETIRAcetam (KEPPRA) 500 mg Oral Tablet, Take 500 mg by mouth Twice daily  levothyroxine (SYNTHROID) 150 mcg Oral Tablet, Take 1 Tablet (150 mcg total) by mouth Every morning  pravastatin (PRAVACHOL) 80 mg Oral Tablet, take 1 tablet by mouth once daily  propranoloL (INDERAL) 10 mg Oral Tablet, Take 1 Tablet (10 mg total) by mouth Twice daily  JANUMET 50-1,000 mg Oral Tablet, take 1 tablet by mouth twice a day with food  losartan-hydrochlorothiazide (HYZAAR) 100-12.5 mg Oral Tablet, Take 1 Tablet by mouth Once a day    No facility-administered medications prior to visit.        Immunization History:     Immunization History   Administered Date(s) Administered    Influenza Vaccine, 6 month-adult 12/19/2016, 01/12/2020    PREVNAR 13 (ADMIN) 12/19/2016         Past Medical History:     Past Medical History:   Diagnosis Date  Chronic pain of right ankle 12/19/2016    Essential hypertension, benign 12/17/2016    History of CVA (cerebrovascular accident) 09/12/2020    Hypothyroidism, adult 12/17/2016    Mixed hyperlipidemia 12/17/2016    Obstructive sleep apnea 12/17/2016    Seizure (CMS HCC)     Stroke (CMS HCC)     Type 2 diabetes mellitus without complication, without long-term current use of insulin (CMS HCC) 12/17/2016             Past Surgical History:     Past Surgical History:   Procedure Laterality Date    HX APPENDECTOMY      HX BACK SURGERY      REPLACEMENT TOTAL KNEE      SHOULDER SURGERY      VEIN LIGATION               Family History:     Family Medical History:     Problem Relation (Age of Onset)    Leukemia Mother    Lymphoma Father              Social History:   Linda [name]  reports that she has never smoked. She has never used smokeless tobacco. She reports that she does not drink alcohol and does not use drugs.          ROS:  Constitutional: Denies fevers, chills, night sweats. No recent weight changes or fatigue.   Eyes: Denies change in vision. No irritation, erythema,  discharge or pain.   Ears: Denies any difficulty with hearing. No ear pain, tinnitus or discharge.  Mouth/Throat: Denies any oral ulcers or other lesions. No sore throat, hoarseness or dysphagia.  Cardiovascular: Denies any chest pain, palpitations, PND or DOE  Respiratory: Denies any shortness of breath, wheezing, cough   GI: Denies any abdominal pain, nausea, vomiting, diarrhea or constipation. No melena or hematochezia.  GU: Denies any dysuria, frequency, hematuria, hesitancy. Denies nocturia or incontinence.  Musculoskeletal: Denies any arthritis or edema. No muscle/joint pain or swelling.   Neurological: No history of syncope or seizures. Denies any dizziness , improving  headaches and balance issues  Emotional/Psychiatric: Denies any memory issues. No history of depression or anxiety.  Skin: Denies any recent rashes or lesions.         Objective:   Vitals:    Vitals:    09/12/20 1425   BP: 122/62   Pulse: 74   Temp: 36.8 C (98.3 F)   SpO2: 98%   Weight: 93 kg (205 lb)   Height: 1.676 m (5\' 6" )   BMI: 33.16          Body mass index is 33.09 kg/m.      Constitutional: Alert, well developed, well nourished  HEENT:  Head: NC/AT    Eyes: Sclera anicteric, conjunctiva not injected    Ears: EAC normal, bilateral TMs clear    Nose: No discharge    Throat: MMM, posterior pharynx without erythema or exudate  Neck:   Supple with normal ROM, no cervical LAD, no thyromegaly, no JVD, no carotid bruits  Cardiovascular: RRR, normal S1/S2, no murmurs/rubs/gallops  Pulmonary:  CTAB, equal air entry, nonlabored, no wheezes/crackles/rhonchi  Abdomen:   NABS, NT/ND, soft, no HSM, no masses  Musculoskeletal:  No deformity, no injury, no edema  Neurological:   Alert, oriented x 3, no abnormal tone  Skin:     Warm, pink, dry, no rashes, no jaundice, no pallor, no cyanosis  Psychiatric:  Normal mood, affect, behavior,  judgment, and thought content        Assessment & Plan:       ICD-10-CM    1. History of CVA (cerebrovascular  accident)  Z86.73    2. Essential hypertension, benign  I10    3. Mixed hyperlipidemia  E78.2    4. Obstructive sleep apnea  G47.33    5. Lumbar radiculopathy  M54.16    6. Hypothyroidism, adult  E03.9    7. Type 2 diabetes mellitus without complication, without long-term current use of insulin (CMS HCC)  E11.9        Orders Placed This Encounter    Valsartan-Hydrochlorothiazide 160-12.5 mg Oral Tablet    semaglutide (OZEMPIC) 0.25 mg or 0.5 mg(2 mg/1.5 mL) Subcutaneous Pen Injector                 Health Maintenance   Topic Date Due    Diabetic Retinal Exam  Never done    Osteoporosis screening  Never done    Hepatitis C screening  Never done    Covid-19 Vaccine (1) Never done    Adult Tdap-Td (1 - Tdap) Never done    Mammography  Never done    Shingles Vaccine (1 of 2) Never done    Pneumococcal Vaccination, Age 31+ (2 - PPSV23 or PCV20) 12/19/2017    Annual Wellness Visit - Calendar Year Insurers  03/10/2020    Influenza Vaccine (1) 11/08/2020    Diabetes A1C  12/11/2020    Depression Screening  09/12/2021    Colonoscopy  07/02/2025    Meningococcal Vaccine  Aged Out   We did review patient diabetes status and need for continued diet, exercise and regular follow up with labs and A1c. We did rec yearly ophthalmology exams and also regular foot exam and did review foot care. Patient will continue to monitor blood glucose and call with any issues or concerns.  I did discuss with the patient and her daughter and we did review that her BP is mildly elevated from where we would like this to be below 120 systolic and so will change the losartan/hctz to valsartan/hctz and titrate as needed. I did also review her increasing BS and will DC the janumet and will start the ozempic as I feel that this will help with her BS and also with her appetite and weight loss. She will continue her other medications as rx. I did review her neurology notes and she is going to have follow up with neurology to review the  possibility of the CCA and also the seizure. She will rto in 3 months and will order labs as above for next visit.   Return in about 3 months (around 12/13/2020).    Algis Downs [employer] DO

## 2020-09-12 ENCOUNTER — Ambulatory Visit (INDEPENDENT_AMBULATORY_CARE_PROVIDER_SITE_OTHER): Payer: Medicare Other | Admitting: Family Medicine

## 2020-09-12 ENCOUNTER — Other Ambulatory Visit: Payer: Self-pay

## 2020-09-12 ENCOUNTER — Encounter (INDEPENDENT_AMBULATORY_CARE_PROVIDER_SITE_OTHER): Payer: Self-pay | Admitting: Family Medicine

## 2020-09-12 VITALS — BP 122/62 | HR 74 | Temp 98.3°F | Ht 66.0 in | Wt 205.0 lb

## 2020-09-12 DIAGNOSIS — I10 Essential (primary) hypertension: Secondary | ICD-10-CM

## 2020-09-12 DIAGNOSIS — M5416 Radiculopathy, lumbar region: Secondary | ICD-10-CM

## 2020-09-12 DIAGNOSIS — E039 Hypothyroidism, unspecified: Secondary | ICD-10-CM

## 2020-09-12 DIAGNOSIS — G4733 Obstructive sleep apnea (adult) (pediatric): Secondary | ICD-10-CM

## 2020-09-12 DIAGNOSIS — E119 Type 2 diabetes mellitus without complications: Secondary | ICD-10-CM

## 2020-09-12 DIAGNOSIS — Z8673 Personal history of transient ischemic attack (TIA), and cerebral infarction without residual deficits: Secondary | ICD-10-CM

## 2020-09-12 DIAGNOSIS — E782 Mixed hyperlipidemia: Secondary | ICD-10-CM

## 2020-09-12 HISTORY — DX: Personal history of transient ischemic attack (TIA), and cerebral infarction without residual deficits: Z86.73

## 2020-09-12 MED ORDER — OZEMPIC 0.25 MG OR 0.5 MG (2 MG/1.5 ML) SUBCUTANEOUS PEN INJECTOR
0.2500 mg | PEN_INJECTOR | SUBCUTANEOUS | 5 refills | Status: DC
Start: 2020-09-12 — End: 2020-10-23

## 2020-09-12 MED ORDER — VALSARTAN 160 MG-HYDROCHLOROTHIAZIDE 12.5 MG TABLET
1.0000 | ORAL_TABLET | Freq: Every day | ORAL | 4 refills | Status: DC
Start: 2020-09-12 — End: 2020-12-13

## 2020-10-12 ENCOUNTER — Other Ambulatory Visit (INDEPENDENT_AMBULATORY_CARE_PROVIDER_SITE_OTHER): Payer: Self-pay | Admitting: Neurology

## 2020-10-12 NOTE — Telephone Encounter (Signed)
T/C TO PT WHO SAYS SHE IS NOW FOLLOWING WITH ANOTHER NEUROLOGIST AND THAT INDERAL WILL BE MANAGED BY PCP

## 2020-10-16 ENCOUNTER — Other Ambulatory Visit (INDEPENDENT_AMBULATORY_CARE_PROVIDER_SITE_OTHER): Payer: Self-pay | Admitting: Family Medicine

## 2020-10-16 MED ORDER — AMLODIPINE 10 MG TABLET
10.0000 mg | ORAL_TABLET | Freq: Every day | ORAL | 5 refills | Status: DC
Start: 2020-10-16 — End: 2020-12-13

## 2020-10-16 NOTE — Telephone Encounter (Signed)
Regarding: Rx refill  ----- Message from Darcella Cheshire sent at 10/15/2020  2:12 PM EDT -----  Doctor Name:  Jacob Moores., DO         Date of last appointment: 09/12/2020    Next scheduled visit: 12/13/2020    Medication Requested:      Disp Refills Start End   amLODIPine (NORVASC) 10 mg Oral Tablet   07/23/2020    Sig - Route: Take 10 mg by mouth - Oral   Class: Historical Med         Preferred Pharmacy     [facility] [phone] - [place], [place] - [address]    [address] [place] [address]    Phone: [phone] Fax: [phone]    Hours: Not open 24 hours

## 2020-10-21 ENCOUNTER — Other Ambulatory Visit (INDEPENDENT_AMBULATORY_CARE_PROVIDER_SITE_OTHER): Payer: Self-pay | Admitting: Neurology

## 2020-10-23 ENCOUNTER — Other Ambulatory Visit (INDEPENDENT_AMBULATORY_CARE_PROVIDER_SITE_OTHER): Payer: Self-pay | Admitting: Family Medicine

## 2020-10-23 ENCOUNTER — Encounter (INDEPENDENT_AMBULATORY_CARE_PROVIDER_SITE_OTHER): Payer: Self-pay | Admitting: Family Medicine

## 2020-10-23 ENCOUNTER — Ambulatory Visit (INDEPENDENT_AMBULATORY_CARE_PROVIDER_SITE_OTHER): Payer: Self-pay | Admitting: Family Medicine

## 2020-10-23 MED ORDER — PROPRANOLOL 10 MG TABLET
10.0000 mg | ORAL_TABLET | Freq: Two times a day (BID) | ORAL | 2 refills | Status: DC
Start: 2020-10-23 — End: 2021-01-28

## 2020-10-23 MED ORDER — GLIMEPIRIDE 1 MG TABLET
1.0000 mg | ORAL_TABLET | Freq: Every morning | ORAL | 3 refills | Status: DC
Start: 2020-10-23 — End: 2020-10-30

## 2020-10-23 MED ORDER — OZEMPIC 0.25 MG OR 0.5 MG (2 MG/1.5 ML) SUBCUTANEOUS PEN INJECTOR
0.5000 mg | PEN_INJECTOR | SUBCUTANEOUS | 5 refills | Status: DC
Start: 2020-10-23 — End: 2020-12-13

## 2020-10-23 NOTE — Telephone Encounter (Signed)
Was you going to change her medication dosage?  Theone Stanley, [place]  10/23/2020, 12:06

## 2020-10-23 NOTE — Telephone Encounter (Signed)
yes i did send it in and did increase to the 0.5mg 

## 2020-10-23 NOTE — Telephone Encounter (Signed)
Regarding: consult on med  ----- Message from Marzella Schlein sent at 10/23/2020 10:27 AM EDT -----  Jacob Moores., DO    Pt is needing to speak with someone about her medication.  Please call pt to advise.  She needs new script for Ozempic she is out but it is supposed to be a different mg

## 2020-10-25 MED ORDER — BLOOD SUGAR DIAGNOSTIC STRIPS
ORAL_STRIP | 3 refills | Status: DC
Start: 2020-10-25 — End: 2020-11-08

## 2020-10-30 ENCOUNTER — Other Ambulatory Visit (INDEPENDENT_AMBULATORY_CARE_PROVIDER_SITE_OTHER): Payer: Self-pay

## 2020-10-30 MED ORDER — GLIMEPIRIDE 1 MG TABLET
1.0000 mg | ORAL_TABLET | Freq: Every morning | ORAL | 3 refills | Status: DC
Start: 2020-10-30 — End: 2020-12-13

## 2020-11-08 ENCOUNTER — Telehealth (INDEPENDENT_AMBULATORY_CARE_PROVIDER_SITE_OTHER): Payer: Self-pay

## 2020-11-08 ENCOUNTER — Other Ambulatory Visit (INDEPENDENT_AMBULATORY_CARE_PROVIDER_SITE_OTHER): Payer: Self-pay

## 2020-11-08 MED ORDER — BLOOD SUGAR DIAGNOSTIC STRIPS
ORAL_STRIP | 3 refills | Status: DC
Start: 2020-11-08 — End: 2020-11-14

## 2020-11-08 NOTE — Telephone Encounter (Signed)
Comprehensive Medication Review    Name: Linda [name]   MRN: [id number]   DOB: [birth year]   Age: 75 y.o.   Date: 11/08/2020     Telephone Encounter  I spoke with Linda [name] today and completed a comprehensive medication review as part of the [employer] program. She has a good understanding of her medications and does not have any questions at this time.       A/P:  1. Medication Review:                 -Medication list is updated and accurate in the Pharm[employer] platform and reconciled with Epic list. Medication lists have been sent to patient and the provider.    Darlys Gales, PHARMD

## 2020-11-13 ENCOUNTER — Ambulatory Visit (INDEPENDENT_AMBULATORY_CARE_PROVIDER_SITE_OTHER): Payer: Self-pay | Admitting: Family Medicine

## 2020-11-13 NOTE — Telephone Encounter (Signed)
Regarding: request call back   ----- Message from [facility] sent at 11/08/2020  9:25 AM EDT -----  Linda Downs Means Jr., DO    A[employer] Loft is requesting a call back with regards to a prior auth status. [employer] Lo[employer] is requesting more clinical notes to be faxed to [phone]. Thank you    REF. [id number]

## 2020-11-13 NOTE — Telephone Encounter (Signed)
Faxed office note over.  Theone Stanley, [place]  11/13/2020, 12:03

## 2020-11-14 ENCOUNTER — Encounter (INDEPENDENT_AMBULATORY_CARE_PROVIDER_SITE_OTHER): Payer: Self-pay | Admitting: Family Medicine

## 2020-11-14 ENCOUNTER — Other Ambulatory Visit (INDEPENDENT_AMBULATORY_CARE_PROVIDER_SITE_OTHER): Payer: Self-pay | Admitting: Family Medicine

## 2020-11-14 MED ORDER — BLOOD SUGAR DIAGNOSTIC STRIPS
ORAL_STRIP | 3 refills | Status: AC
Start: 2020-11-14 — End: 2020-11-15

## 2020-11-14 MED ORDER — LANCETS 26 GAUGE
11 refills | Status: DC
Start: 2020-11-14 — End: 2020-12-06

## 2020-11-14 MED ORDER — BLOOD GLUCOSE CONTROL, NORMAL SOLUTION
11 refills | Status: AC
Start: 2020-11-14 — End: 2020-11-15

## 2020-11-14 MED ORDER — BLOOD-GLUCOSE METER
0 refills | Status: AC
Start: 2020-11-14 — End: 2020-11-15

## 2020-12-04 ENCOUNTER — Other Ambulatory Visit (INDEPENDENT_AMBULATORY_CARE_PROVIDER_SITE_OTHER): Payer: Self-pay | Admitting: Family Medicine

## 2020-12-04 NOTE — Telephone Encounter (Signed)
Last Visit:09/01/2019     Upcoming appointments: 12/13/2020           Linda [name]  12/04/2020, 11:11

## 2020-12-05 ENCOUNTER — Encounter (INDEPENDENT_AMBULATORY_CARE_PROVIDER_SITE_OTHER): Payer: Self-pay | Admitting: Family Medicine

## 2020-12-06 ENCOUNTER — Other Ambulatory Visit (INDEPENDENT_AMBULATORY_CARE_PROVIDER_SITE_OTHER): Payer: Self-pay | Admitting: Family Medicine

## 2020-12-06 MED ORDER — LANCETS 26 GAUGE
3 refills | Status: AC
Start: 2020-12-06 — End: 2020-12-07

## 2020-12-06 NOTE — Telephone Encounter (Signed)
Spoke with patient - advised her to discuss w/ provider back & leg pain at next appt 12/13/20, pt okay with waiting to discuss then.    Needs lancets refilled    Last Visit:09/12/20     Upcoming appointments: 12/13/20        Dewayne Shorter Graft  12/06/2020, 09:05

## 2020-12-06 NOTE — Telephone Encounter (Signed)
Regarding: refill / pt fyii  ----- Message from Johnny Bridge sent at 12/06/2020  8:10 AM EDT -----  Means    lancets     Pt also has back and leg pain    Preferred Pharmacy     [id number] - [place], [place] - [address]    [address] [place] [address]    Phone: [phone] Fax: [phone]    Hours: Not open 24 hours

## 2020-12-11 ENCOUNTER — Ambulatory Visit (INDEPENDENT_AMBULATORY_CARE_PROVIDER_SITE_OTHER): Payer: Medicare PPO

## 2020-12-11 ENCOUNTER — Other Ambulatory Visit: Payer: Medicare PPO | Attending: Family Medicine | Admitting: Family Medicine

## 2020-12-11 ENCOUNTER — Other Ambulatory Visit: Payer: Self-pay

## 2020-12-11 DIAGNOSIS — E039 Hypothyroidism, unspecified: Secondary | ICD-10-CM | POA: Insufficient documentation

## 2020-12-11 DIAGNOSIS — E782 Mixed hyperlipidemia: Secondary | ICD-10-CM

## 2020-12-11 DIAGNOSIS — E119 Type 2 diabetes mellitus without complications: Secondary | ICD-10-CM

## 2020-12-11 DIAGNOSIS — I10 Essential (primary) hypertension: Secondary | ICD-10-CM | POA: Insufficient documentation

## 2020-12-11 LAB — COMPREHENSIVE METABOLIC PNL, FASTING
ALBUMIN/GLOBULIN RATIO: 1.8 (ref >=1.0)
ALBUMIN: 4.2 g/dL (ref 3.5–5.2)
ALKALINE PHOSPHATASE: 51 U/L (ref 41–133)
ALT (SGPT): 9 U/L (ref 0–55)
ANION GAP: 6 mmol/L (ref 6–15)
AST (SGOT): 12 U/L (ref 5–34)
BILIRUBIN TOTAL: 0.5 mg/dL (ref 0.2–1.2)
BUN: 18 mg/dL (ref 7–21)
CALCIUM: 9.7 mg/dL (ref 8.0–10.6)
CHLORIDE: 104 mmol/L (ref 98–107)
CO2 TOTAL: 31 mmol/L (ref 21–32)
CREATININE: 0.72 mg/dL — ABNORMAL LOW (ref 0.80–1.60)
ESTIMATED GFR: >60 mL/min/{1.73_m2}
GLUCOSE: 164 mg/dL — ABNORMAL HIGH (ref 70–100)
POTASSIUM: 4.1 mmol/L (ref 3.3–5.1)
PROTEIN TOTAL: 6.6 g/dL (ref 6.4–8.3)
SODIUM: 141 mmol/L (ref 136–146)

## 2020-12-11 LAB — CBC
HCT: 36.4 % (ref 34.8–46.0)
HGB: 11.9 g/dL (ref 11.5–16.0)
MCH: 31.6 pg (ref 26.0–32.0)
MCHC: 32.7 g/dL (ref 31.0–35.5)
MCV: 96.6 fL (ref 78.0–100.0)
MPV: 9.6 fL (ref 8.7–12.5)
PLATELETS: 228 10*3/uL (ref 150–400)
RBC: 3.77 10*6/uL — ABNORMAL LOW (ref 3.85–5.22)
RDW-CV: 12.8 % (ref 11.5–15.5)
WBC: 5.9 10*3/uL (ref 3.7–11.0)

## 2020-12-11 LAB — LIPID PANEL
CHOL/HDL RATIO: 3.4
CHOLESTEROL: 156 mg/dL (ref 75–200)
HDL CHOL: 46 mg/dL (ref 23–72)
LDL CALC: 88 mg/dL (ref 0–99)
TRIGLYCERIDES: 108 mg/dL (ref 28–153)
VLDL CALC: 22 mg/dL (ref 0–50)

## 2020-12-11 LAB — THYROXINE, FREE (FREE T4): THYROXINE (T4), FREE: 1.62 ng/dL — ABNORMAL HIGH (ref 0.61–1.12)

## 2020-12-11 LAB — THYROID STIMULATING HORMONE (SENSITIVE TSH): TSH: <0.01 u[IU]/mL — ABNORMAL LOW (ref 0.450–5.330)

## 2020-12-11 NOTE — Nursing Note (Signed)
Department of Community Practice     Venipuncture performed in office on right arm antecubital vein, dry pressure dressing was applied to site and patient tolerated it well.  Specimen was centrifuged, aliquoted as needed and specimen was labeled and packaged for transport.    Theone Stanley, [place]  12/11/2020, 10:09

## 2020-12-12 LAB — T3 (TRIIODOTHYRONINE), FREE, SERUM: T3, FREE: 4 pg/mL (ref 2.3–4.2)

## 2020-12-12 LAB — HGA1C (HEMOGLOBIN A1C WITH EST AVG GLUCOSE): HEMOGLOBIN A1C: 6.9 % — ABNORMAL HIGH (ref 4.0–6.0)

## 2020-12-12 NOTE — Progress Notes (Unsigned)
[facility]  [address]  [place] [address]    Medicare Annual Wellness Visit    Name: Linda [name] MRN:  [id number]   Date: 12/13/2020 Age: 75 y.o.       SUBJECTIVE:   Linda [name] is a 75 y.o. female for presenting for Medicare Wellness exam.   I have reviewed and reconciled the medication list with the patient today.  She   Comprehensive Health Assessment:  Paper document COMPREHENSIVE HEALTH ASSESSMENT reviewed, signed and scanned into medical record    I have reviewed and updated as appropriate the past medical, family and social history. 12/13/2020 as summarized below:  Past Medical History:   Diagnosis Date   . Chronic pain of right ankle 12/19/2016   . Essential hypertension, benign 12/17/2016   . History of CVA (cerebrovascular accident) 09/12/2020   . Hypothyroidism, adult 12/17/2016   . Mixed hyperlipidemia 12/17/2016   . Obstructive sleep apnea 12/17/2016   . Seizure (CMS HCC)    . Stroke (CMS [facility])    . Type 2 diabetes mellitus without complication, without long-term current use of insulin (CMS HCC) 12/17/2016     Past Surgical History:   Procedure Laterality Date   . Hx appendectomy     . Hx back surgery     . Replacement total knee     . Shoulder surgery     . Vein ligation       Current Outpatient Medications   Medication Sig   . acetaminophen (TYLENOL) 500 mg Oral Tablet Take 500 mg by mouth Every 4 hours as needed for Pain   . amLODIPine (NORVASC) 5 mg Oral Tablet Take 1 Tablet (5 mg total) by mouth Once a day   . cholecalciferol, vitamin D3, 1,250 mcg (50,000 unit) Oral Capsule Take 1 Cap (50,000 Units total) by mouth Every 7 days (Patient not taking: No sig reported)   . Diclofenac Sodium 3 % Gel Apply topically   . glimepiride (AMARYL) 2 mg Oral Tablet Take 1 Tablet (2 mg total) by mouth Every morning with breakfast   . levETIRAcetam (KEPPRA) 500 mg Oral Tablet Take 500 mg by mouth Twice daily   . levothyroxine (SYNTHROID) 137 mcg Oral Tablet Take 1 Tablet (137 mcg total) by  mouth Every morning   . pravastatin (PRAVACHOL) 80 mg Oral Tablet Take 1 Tablet (80 mg total) by mouth Once a day   . propranoloL (INDERAL) 10 mg Oral Tablet Take 1 Tablet (10 mg total) by mouth Twice daily   . SITagliptin-metformin (JANUMET) 50-1,000 mg Oral Tablet Take 1 Tablet by mouth Twice daily with food   . Valsartan-Hydrochlorothiazide 160-12.5 mg Oral Tablet Take 1 Tablet by mouth Once a day     Family Medical History:     Problem Relation (Age of Onset)    Leukemia Mother    Lymphoma Father          Social History     Socioeconomic History   . Marital status: Married   Tobacco Use   . Smoking status: Never Smoker   . Smokeless tobacco: Never Used   Substance and Sexual Activity   . Alcohol use: No   . Drug use: Never     Review of Systems: Pertinent items are noted in HPI.     List of Current Health Care Providers   Care Team     PCP     Name Type Specialty Phone Number    Means, Hipolito Bayley., DO  Physician [facility] [phone]          Care Team     No care team found                  Health Maintenance   Topic Date Due   . Diabetic Retinal Exam  Never done   . Osteoporosis screening  Never done   . Hepatitis C screening  Never done   . Covid-19 Vaccine (1) Never done   . Adult Tdap-Td (1 - Tdap) Never done   . Mammography  Never done   . Shingles Vaccine (1 of 2) Never done   . Pneumococcal Vaccination, Age 76+ (2 - PPSV23 or PCV20) 12/19/2017   . Influenza Vaccine (1) 11/08/2020   . Diabetes A1C  06/11/2021   . Depression Screening  12/13/2021   . Colonoscopy  07/02/2025   . Annual Wellness Visit - Calendar Year Insurers  Completed   . Meningococcal Vaccine  Aged Out     Medicare Wellness Assessment   Medicare initial or wellness physical in the last year?: No  Advance Directives (optional)   Does patient have a living will or MPOA: YES   Has patient provided [employer] with a copy?: no          Activities of Daily Living   Do you need help with dressing, bathing, or walking?: Yes (walking)    Do you need help with shopping, housekeeping, medications, or finances?: Yes   Do you have rugs in hallways, broken steps, or poor lighting?: No   Do you have grab bars in your bathroom, non-slip strips in your tub, and hand rails on your stairs?: Yes   Urinary Incontinence Screen (Women >=65 only)   Do you ever leak urine when you don't want to?: YES   Cognitive Function Screen (1=Yes, 0=No)   What is you age?: Correct   What is the time to the nearest hour?: Correct   What is the year?: Correct   What is the name of this clinic?: Correct   Can the patient recognize two persons (the doctor, the nurse, home help, etc.)?: Correct   What is the date of your birth? (day and month sufficient) : Correct   In what year did World War II end?: Correct   Who is the current president of the [place]?: Correct   Count from 20 down to 1?: Correct   What address did I give you earlier?: Correct   Total Score: 10       Hearing Screen   Have you noticed any hearing difficulties?: Yes  After whispering 9-1-6 how many numbers did the patient repeat correctly?: 3   Fall Risk Screen   Do you feel unsteady when standing or walking?: Yes  Do you worry about falling?: Yes  Have you fallen in the past year?: Yes  How many times have you fallen?: Once   Vision Screen   Right Eye = 20: 20   Left Eye = 20: 20   Depression Screen   Little interest or pleasure in doing things.: Not at all  Feeling down, depressed, or hopeless: Not at all  PHQ 2 Total: 0        OBJECTIVE:   BP 116/60   Pulse 77   Temp 35.9 C (96.7 F)   Ht 1.676 m (5\' 6" )   Wt 86.2 kg (190 lb)   SpO2 99%   BMI 30.67 kg/m  Other appropriate exam:  General: Patient is very pleasant and cooperative and in no acute distress.  HEENT: Perl, eomi, tm intact. Nares clear. Oral mucosa pink and moist. No Lesions. TM intact bilaterally.  Neck: Soft, Supple, no masses, Thyroid palpated and not enlarged.  Heart: Regular rate and rhythm and no murmurs, clicks or  rubs.  Lungs: Clear to auscultation bilaterally. Good air movement. No wheezing or rhochi.  Abdomen: Soft, non tender, no masses , no organomegaly. + normoactive/ normopitch bowel sounds.  Extremities: Intact x 4. no gross deformity and no edema.  Musculoskeletal: Good ROM. Spine intact and no gross deformities.  Neurologic: Strength and sensation grossly equal and appropriate. No focal deficits noted.  Skin: No lesions or rashes noted.  Health Maintenance Due   Topic Date Due   . Diabetic Retinal Exam  Never done   . Osteoporosis screening  Never done   . Hepatitis C screening  Never done   . Covid-19 Vaccine (1) Never done   . Adult Tdap-Td (1 - Tdap) Never done   . Mammography  Never done   . Shingles Vaccine (1 of 2) Never done   . Pneumococcal Vaccination, Age 24+ (2 - PPSV23 or PCV20) 12/19/2017   . Influenza Vaccine (1) 11/08/2020      ASSESSMENT & PLAN:   1. Medicare annual wellness visit, subsequent    2. Right hip pain    3. Essential hypertension, benign    4. Mixed hyperlipidemia    5. Type 2 diabetes mellitus without complication, without long-term current use of insulin (CMS HCC)    6. Hypothyroidism, adult       Identified Risk Factors/ Recommended Actions           Orders Placed This Encounter   . XR HIP RIGHT   . Flu Vaccine, 6 month-adult,0.5 mL IM (Admin)   . CBC/DIFF   . COMPREHENSIVE METABOLIC PNL, FASTING   . LIPID PANEL   . HGA1C (HEMOGLOBIN A1C WITH EST AVG GLUCOSE)   . Sensitive TSH   . T3, Free   . T4, Free   . SITagliptin-metformin (JANUMET) 50-1,000 mg Oral Tablet   . levothyroxine (SYNTHROID) 137 mcg Oral Tablet   . amLODIPine (NORVASC) 5 mg Oral Tablet   . glimepiride (AMARYL) 2 mg Oral Tablet   . pravastatin (PRAVACHOL) 80 mg Oral Tablet   . Valsartan-Hydrochlorothiazide 160-12.5 mg Oral Tablet        We did review patient diabetes status and need for continued diet, exercise and regular follow up with labs and A1c. I did discuss with the patient and I did review her most recent labs  and her tchol did improve to 156 and LDL was 88. Her tg did improve to 108. Her a1c did increase slightly to 6.9%. Her TSH is suppressed and I did rec that we decrease her synthroid dosage as above. I did rec that she have the labs repeated as above and follow up in 3 months. She is agreeable. We did rec yearly ophthalmology exams and also regular foot exam and did review foot care. Patient will continue to monitor blood glucose and call with any issues or concerns. I did review her ozempic and the cost is prohibitive and so will change back to the janumet and monitor her BS. Will also increase the Amaryl. Will decrease her Norvasc due to the low BP.   The patient has been educated about risk factors and recommended preventive care. Written Prevention Plan completed/ updated and  given to patient (see After Visit Summary).    Return in about 6 months (around 06/13/2021).    Jacob Moores., DO  [facility]  [address]  [place] [address]  Phone: [phone]  Fax: [phone]

## 2020-12-13 ENCOUNTER — Telehealth (INDEPENDENT_AMBULATORY_CARE_PROVIDER_SITE_OTHER): Payer: Self-pay | Admitting: Family Medicine

## 2020-12-13 ENCOUNTER — Encounter (INDEPENDENT_AMBULATORY_CARE_PROVIDER_SITE_OTHER): Payer: Self-pay | Admitting: Family Medicine

## 2020-12-13 ENCOUNTER — Ambulatory Visit (INDEPENDENT_AMBULATORY_CARE_PROVIDER_SITE_OTHER): Payer: Medicare PPO | Admitting: Family Medicine

## 2020-12-13 ENCOUNTER — Other Ambulatory Visit: Payer: Self-pay

## 2020-12-13 VITALS — BP 116/60 | HR 77 | Temp 96.7°F | Ht 66.0 in | Wt 190.0 lb

## 2020-12-13 DIAGNOSIS — E039 Hypothyroidism, unspecified: Secondary | ICD-10-CM

## 2020-12-13 DIAGNOSIS — Z23 Encounter for immunization: Secondary | ICD-10-CM

## 2020-12-13 DIAGNOSIS — I10 Essential (primary) hypertension: Secondary | ICD-10-CM

## 2020-12-13 DIAGNOSIS — M25551 Pain in right hip: Secondary | ICD-10-CM

## 2020-12-13 DIAGNOSIS — E782 Mixed hyperlipidemia: Secondary | ICD-10-CM

## 2020-12-13 DIAGNOSIS — Z0000 Encounter for general adult medical examination without abnormal findings: Secondary | ICD-10-CM

## 2020-12-13 DIAGNOSIS — E119 Type 2 diabetes mellitus without complications: Secondary | ICD-10-CM

## 2020-12-13 MED ORDER — PRAVASTATIN 80 MG TABLET
80.0000 mg | ORAL_TABLET | Freq: Every day | ORAL | 1 refills | Status: DC
Start: 2020-12-13 — End: 2021-08-30

## 2020-12-13 MED ORDER — VALSARTAN 160 MG-HYDROCHLOROTHIAZIDE 12.5 MG TABLET
1.0000 | ORAL_TABLET | Freq: Every day | ORAL | 4 refills | Status: DC
Start: 2020-12-13 — End: 2021-08-30

## 2020-12-13 MED ORDER — AMLODIPINE 5 MG TABLET
5.0000 mg | ORAL_TABLET | Freq: Every day | ORAL | 4 refills | Status: DC
Start: 2020-12-13 — End: 2022-03-13

## 2020-12-13 MED ORDER — SITAGLIPTIN PHOSPHATE 50 MG-METFORMIN 1,000 MG TABLET
1.0000 | ORAL_TABLET | Freq: Two times a day (BID) | ORAL | 3 refills | Status: DC
Start: 2020-12-13 — End: 2022-03-19

## 2020-12-13 MED ORDER — LEVOTHYROXINE 137 MCG TABLET
137.0000 ug | ORAL_TABLET | Freq: Every morning | ORAL | 4 refills | Status: DC
Start: 2020-12-13 — End: 2021-02-12

## 2020-12-13 MED ORDER — GLIMEPIRIDE 2 MG TABLET
2.0000 mg | ORAL_TABLET | Freq: Every morning | ORAL | 3 refills | Status: DC
Start: 2020-12-13 — End: 2021-12-05

## 2020-12-13 NOTE — Nursing Note (Signed)
12/13/20 1000   Medicare Wellness Assessment   Medicare initial or wellness physical in the last year? No   Advance Directives   Does patient have a living will or MPOA YES   Has patient provided [employer] with a copy? no   Activities of Daily Living   Do you need help with dressing, bathing, or walking? Yes  (walking)   Do you need help with shopping, housekeeping, medications, or finances? Yes   Do you have rugs in hallways, broken steps, or poor lighting? No   Do you have grab bars in your bathroom, non-slip strips in your tub, and hand rails on your stairs? Yes   Urinary Incontinence Screen-Women only   Do you ever leak urine when you don't want to? YES   Cognitive Function Screen   What is you age? 1   What is the time to the nearest hour? 1   What is the year? 1   What is the name of this clinic? 1   Can the patient recognize two persons (the doctor, the nurse, home help, etc.)? 1   What is the date of your birth? (day and month sufficient)  1   In what year did World War II end? 1   Who is the current president of the [place]? 1   Count from 20 down to 1? 1   What address did I give you earlier? 1   Total Score 10   Depression Screen   Little interest or pleasure in doing things. 0   Feeling down, depressed, or hopeless 0   PHQ 2 Total 0   Hearing Screen   Have you noticed any hearing difficulties? Yes   After whispering 9-1-6 how many numbers did the patient repeat correctly? 3   Fall Risk Assessment   Do you feel unsteady when standing or walking? Yes   Do you worry about falling? Yes   Have you fallen in the past year? Yes   How many times have you fallen? Once   Vision Screen   Right Eye = 20 20   Left Eye = 20 20

## 2020-12-13 NOTE — Nursing Note (Signed)
Department of Community Practice     Patient received vaccine in clinic.  Tolerated it well, given VIS sheet and was discharged to home.  Immunization administered     Name Date Dose VIS Date Route    Influenza Vaccine, 6 month-adult 12/13/2020 0.5 mL 10/14/2019 Intramuscular    Site: Left deltoid    Given By: Shaquelle Hernon A, MA    Manufacturer: [employer]    Lot: [id number]    NDC: [id number]          Charlann Wayne A Luvinia Lucy, MA  12/13/2020, 12:12

## 2020-12-13 NOTE — Telephone Encounter (Signed)
-----   Message from [employer] DO sent at 12/12/2020 12:49 PM EDT -----  Her a1c is mildly higher at 6.9% and her thyroid is mildly overactive and will review at her visit with her

## 2020-12-13 NOTE — Telephone Encounter (Signed)
Spoke with patient  Linda [name], [place]  12/13/2020, 11:02

## 2020-12-18 ENCOUNTER — Ambulatory Visit (INDEPENDENT_AMBULATORY_CARE_PROVIDER_SITE_OTHER): Payer: Self-pay | Admitting: Family Medicine

## 2020-12-18 NOTE — Telephone Encounter (Signed)
Regarding: Fax sent  ----- Message from Zorita Pang Cogar sent at 12/12/2020  9:02 AM EDT -----  Jacob Moores., DO    Requesting confirmation that the fax was received denying the approval of glucose test strips.  An appeal is open.

## 2020-12-19 ENCOUNTER — Encounter (INDEPENDENT_AMBULATORY_CARE_PROVIDER_SITE_OTHER): Payer: Self-pay | Admitting: Family Medicine

## 2020-12-20 ENCOUNTER — Telehealth (INDEPENDENT_AMBULATORY_CARE_PROVIDER_SITE_OTHER): Payer: Self-pay | Admitting: Family Medicine

## 2020-12-20 ENCOUNTER — Other Ambulatory Visit (INDEPENDENT_AMBULATORY_CARE_PROVIDER_SITE_OTHER): Payer: Self-pay | Admitting: Family Medicine

## 2020-12-20 ENCOUNTER — Encounter (INDEPENDENT_AMBULATORY_CARE_PROVIDER_SITE_OTHER): Payer: Self-pay | Admitting: Family Medicine

## 2020-12-20 DIAGNOSIS — M25551 Pain in right hip: Secondary | ICD-10-CM

## 2020-12-20 DIAGNOSIS — M5416 Radiculopathy, lumbar region: Secondary | ICD-10-CM

## 2020-12-20 NOTE — Telephone Encounter (Signed)
Spoke with patient about hip x rays.  She will take the referral to [facility].  Theone Stanley, [place]  12/20/2020, 09:53

## 2020-12-20 NOTE — Telephone Encounter (Signed)
Patient will take the referral to [facility].  Theone Stanley, [place]  12/20/2020, 09:54

## 2020-12-21 ENCOUNTER — Ambulatory Visit (INDEPENDENT_AMBULATORY_CARE_PROVIDER_SITE_OTHER): Payer: Self-pay | Admitting: Family Medicine

## 2020-12-21 NOTE — Telephone Encounter (Signed)
Regarding: request order  ----- Message from Darcella Cheshire sent at 12/21/2020  1:58 PM EDT -----  Jacob Moores., DO    3rd request    Sherrilyn Rist is still waiting to get medical records, and form that was faxed over.

## 2020-12-26 ENCOUNTER — Ambulatory Visit (INDEPENDENT_AMBULATORY_CARE_PROVIDER_SITE_OTHER): Payer: Self-pay | Admitting: Family Medicine

## 2020-12-26 NOTE — Telephone Encounter (Signed)
Regarding: FYI  ----- Message from Lysle Pearl sent at 12/26/2020 10:37 AM EDT -----  Jacob Moores., DO    [facility] is calling in to say this contour strips the pt is not using these any more. [facility] is asking that we redraw the appeal. Thank you

## 2020-12-26 NOTE — Telephone Encounter (Signed)
Discussed this with Benedetto Goad, MA  12/26/2020, 15:49

## 2020-12-26 NOTE — Telephone Encounter (Signed)
Spoke with [employer] about strips  Linda [name], [place]  12/26/2020, 14:29

## 2021-01-27 ENCOUNTER — Other Ambulatory Visit (INDEPENDENT_AMBULATORY_CARE_PROVIDER_SITE_OTHER): Payer: Self-pay | Admitting: Family Medicine

## 2021-02-06 NOTE — Progress Notes (Signed)
OUTPATIENT PROGRESS NOTE    Subjective:   Patient ID:  Ms. Kellas is a pleasant 75 y.o. female.    Chief Complaint: Leg Pain (Pain all over)      History of Present Illness:  She did have tick embedded in her right side in October and she states that she was given 200mg  of the doxcycline at the time. She state that now over the last several weeks she has had increased joint pain. She does have the chronic back pain and did have steroid injection and this did help her back pain but then the leg pain and weakness has increased. She also has had some increase in her right wrist and this is new. She feels some similar sx on the left side. She has been very fatigued. She does feel cold. She denies any injury. She is very limited in her activity. She has limited use of the right hand and wrist area.   She did see neurology and she was cleared for driving and she has recovered well from this. She did have medrol pak few weeks ago and this did help her symptoms. She also feels weak.   The history is provided by the patient.      Allergies:   No Known Allergies      Medications:   acetaminophen (TYLENOL) 500 mg Oral Tablet, Take 500 mg by mouth Every 4 hours as needed for Pain  amLODIPine (NORVASC) 5 mg Oral Tablet, Take 1 Tablet (5 mg total) by mouth Once a day  cholecalciferol, vitamin D3, 1,250 mcg (50,000 unit) Oral Capsule, Take 1 Cap (50,000 Units total) by mouth Every 7 days (Patient not taking: No sig reported)  Diclofenac Sodium 3 % Gel, Apply topically  glimepiride (AMARYL) 2 mg Oral Tablet, Take 1 Tablet (2 mg total) by mouth Every morning with breakfast  levETIRAcetam (KEPPRA) 500 mg Oral Tablet, Take 500 mg by mouth Twice daily  levothyroxine (SYNTHROID) 137 mcg Oral Tablet, Take 1 Tablet (137 mcg total) by mouth Every morning  pravastatin (PRAVACHOL) 80 mg Oral Tablet, Take 1 Tablet (80 mg total) by mouth Once a day  propranoloL (INDERAL) 10 mg Oral Tablet, take 1 tablet by mouth twice a  day  SITagliptin-metformin (JANUMET) 50-1,000 mg Oral Tablet, Take 1 Tablet by mouth Twice daily with food  Valsartan-Hydrochlorothiazide 160-12.5 mg Oral Tablet, Take 1 Tablet by mouth Once a day    No facility-administered medications prior to visit.        Immunization History:     Immunization History   Administered Date(s) Administered   . Influenza Vaccine, 6 month-adult 12/19/2016, 01/12/2020, 12/13/2020   . PREVNAR 13 12/19/2016         Past Medical History:     Past Medical History:   Diagnosis Date   . Chronic pain of right ankle 12/19/2016   . Essential hypertension, benign 12/17/2016   . History of CVA (cerebrovascular accident) 09/12/2020   . Hypothyroidism, adult 12/17/2016   . Mixed hyperlipidemia 12/17/2016   . Obstructive sleep apnea 12/17/2016   . Seizure (CMS [place])    . Stroke (CMS [facility])    . Type 2 diabetes mellitus without complication, without long-term current use of insulin (CMS [place]) 12/17/2016             Past Surgical History:     Past Surgical History:   Procedure Laterality Date   . HX APPENDECTOMY     . HX BACK SURGERY     .  REPLACEMENT TOTAL KNEE     . SHOULDER SURGERY     . VEIN LIGATION               Family History:     Family Medical History:     Problem Relation (Age of Onset)    Leukemia Mother    Lymphoma Father              Social History:   Alvern Crask  reports that she has never smoked. She has never used smokeless tobacco. She reports that she does not drink alcohol and does not use drugs.          ROS:  as above in HPI otherwise      Objective:   Vitals:    Vitals:    02/07/21 1000   BP: 124/60   Pulse: 69   Temp: 35.7 C (96.2 F)   SpO2: 98%   Weight: 85.8 kg (189 lb 3.2 oz)   Height: 1.676 m (5\' 6" )   BMI: 30.6          Body mass index is 30.54 kg/m.      Constitutional: Alert, well developed, well nourished  HEENT:  Head: NC/AT    Eyes: Sclera anicteric, conjunctiva not injected    Ears: EAC normal, bilateral TMs clear    Nose: No discharge    Throat: MMM, posterior pharynx  without erythema or exudate  Neck:   Supple with normal ROM, no cervical LAD, no thyromegaly, no JVD, no carotid bruits  Cardiovascular: RRR, normal S1/S2, no murmurs/rubs/gallops  Pulmonary:  CTAB, equal air entry, nonlabored, no wheezes/crackles/rhonchi  Abdomen:   NABS, NT/ND, soft, no HSM, no masses  Musculoskeletal:  tender at multiple muscle groups and weakness and tenderness at the shoulder and hip girdle areas. weakness arising from chair. pain with ROM right wrist and mild calor but no active effusion or redness.  Neurological:   Alert, oriented x 3, no abnormal tone  Skin:     Warm, pink, dry, no rashes, no jaundice, no pallor, no cyanosis  Psychiatric:  Normal mood, affect, behavior, judgment, and thought content        Assessment & Plan:       ICD-10-CM    1. Arthralgia, unspecified joint  M25.50 SEDIMENTATION RATE     CPK (CK)     C-REACTIVE PROTEIN (CRP)     RHEUMATOID FACTOR     CBC/DIFF     COMPREHENSIVE METABOLIC PANEL, NON-FASTING     HGA1C (HEMOGLOBIN A1C WITH EST AVG GLUCOSE)     ANA (ANTINUCLEAR ANTIBODIES), SERUM    acute onset and etiology? with recent tick bite      2. Essential hypertension, benign  I10       3. Mixed hyperlipidemia  E78.2       4. Obstructive sleep apnea  G47.33       5. History of CVA (cerebrovascular accident)  Z86.73       6. Lumbar radiculopathy  M54.16       7. Type 2 diabetes mellitus without complication, without long-term current use of insulin (CMS HCC)  E11.9       8. Hypothyroidism, adult  E03.9 Sensitive TSH     T3, Free     T4, Free      9. Tick bite of abdominal wall, initial encounter  S30.861A LYME ANTIBODY PANEL WITH REFLEX    W57.XXXA           Orders Placed This  Encounter   . SEDIMENTATION RATE   . CPK (CK)   . C-REACTIVE PROTEIN (CRP)   . RHEUMATOID FACTOR   . CBC/DIFF   . COMPREHENSIVE METABOLIC PANEL, NON-FASTING   . HGA1C (HEMOGLOBIN A1C WITH EST AVG GLUCOSE)   . ANA (ANTINUCLEAR ANTIBODIES), SERUM   . LYME ANTIBODY PANEL WITH REFLEX   . Sensitive TSH    . T3, Free   . T4, Free   . predniSONE (DELTASONE) 10 mg Oral Tablet                 Health Maintenance   Topic Date Due   . Diabetic Retinal Exam  Never done   . Osteoporosis screening  Never done   . Hepatitis C screening  Never done   . Covid-19 Vaccine (1) Never done   . Adult Tdap-Td (1 - Tdap) Never done   . Mammography  Never done   . Shingles Vaccine (1 of 2) Never done   . Pneumococcal Vaccination, Age 3+ (2 - PPSV23 if available, else PCV20) 12/19/2017   . Diabetes A1C  06/11/2021   . Depression Screening  12/13/2021   . Colonoscopy  07/02/2025   . Influenza Vaccine  Completed   . Annual Wellness Visit - Calendar Year Insurers  Completed   . Meningococcal Vaccine  Aged Out   I did discuss with the patient and her dtr and am very concerned about the acute onset of the arthralgias and the weakness and her history of the recent tick bite. I did rec that she have the labs as above and will also evaluate for other autoimmune issues etc. I did rec trial of the prednisone taper as she has responded to this in the past. I did rec follow up after testing and will intervene as needed or do further testing if w/u is negative. They are agreeable and will call with any issues/concerns.     Return in about 4 weeks (around 03/07/2021) for Follow up after testing.    Algis Downs [employer] DO

## 2021-02-07 ENCOUNTER — Other Ambulatory Visit: Payer: Medicare PPO | Attending: Family Medicine | Admitting: Family Medicine

## 2021-02-07 ENCOUNTER — Ambulatory Visit (INDEPENDENT_AMBULATORY_CARE_PROVIDER_SITE_OTHER): Payer: Medicare PPO | Admitting: Family Medicine

## 2021-02-07 ENCOUNTER — Encounter (INDEPENDENT_AMBULATORY_CARE_PROVIDER_SITE_OTHER): Payer: Self-pay | Admitting: Family Medicine

## 2021-02-07 ENCOUNTER — Other Ambulatory Visit: Payer: Self-pay

## 2021-02-07 VITALS — BP 124/60 | HR 69 | Temp 96.2°F | Ht 66.0 in | Wt 189.2 lb

## 2021-02-07 DIAGNOSIS — W57XXXA Bitten or stung by nonvenomous insect and other nonvenomous arthropods, initial encounter: Secondary | ICD-10-CM | POA: Insufficient documentation

## 2021-02-07 DIAGNOSIS — E039 Hypothyroidism, unspecified: Secondary | ICD-10-CM | POA: Insufficient documentation

## 2021-02-07 DIAGNOSIS — E119 Type 2 diabetes mellitus without complications: Secondary | ICD-10-CM

## 2021-02-07 DIAGNOSIS — M2550 Pain in unspecified joint: Secondary | ICD-10-CM | POA: Insufficient documentation

## 2021-02-07 DIAGNOSIS — G4733 Obstructive sleep apnea (adult) (pediatric): Secondary | ICD-10-CM

## 2021-02-07 DIAGNOSIS — Z8673 Personal history of transient ischemic attack (TIA), and cerebral infarction without residual deficits: Secondary | ICD-10-CM

## 2021-02-07 DIAGNOSIS — S30861A Insect bite (nonvenomous) of abdominal wall, initial encounter: Secondary | ICD-10-CM | POA: Insufficient documentation

## 2021-02-07 DIAGNOSIS — I10 Essential (primary) hypertension: Secondary | ICD-10-CM

## 2021-02-07 DIAGNOSIS — E782 Mixed hyperlipidemia: Secondary | ICD-10-CM

## 2021-02-07 DIAGNOSIS — M5416 Radiculopathy, lumbar region: Secondary | ICD-10-CM

## 2021-02-07 LAB — CBC WITH DIFF
BASOPHIL #: <0.1 10*3/uL (ref <=0.20)
BASOPHIL %: 1 %
EOSINOPHIL #: <0.1 10*3/uL (ref <=0.50)
EOSINOPHIL %: 0 %
HCT: 35.2 % (ref 34.8–46.0)
HGB: 11.6 g/dL (ref 11.5–16.0)
IMMATURE GRANULOCYTE #: <0.1 10*3/uL (ref <0.10)
IMMATURE GRANULOCYTE %: 0 % (ref 0–1)
LYMPHOCYTE #: 1.35 10*3/uL (ref 1.00–4.80)
LYMPHOCYTE %: 19 %
MCH: 31.8 pg (ref 26.0–32.0)
MCHC: 33 g/dL (ref 31.0–35.5)
MCV: 96.4 fL (ref 78.0–100.0)
MONOCYTE #: 0.57 10*3/uL (ref 0.20–1.10)
MONOCYTE %: 8 %
MPV: 9.5 fL (ref 8.7–12.5)
NEUTROPHIL #: 5.1 10*3/uL (ref 1.50–7.70)
NEUTROPHIL %: 72 %
PLATELETS: 223 10*3/uL (ref 150–400)
RBC: 3.65 10*6/uL — ABNORMAL LOW (ref 3.85–5.22)
RDW-CV: 12.7 % (ref 11.5–15.5)
WBC: 7.1 10*3/uL (ref 3.7–11.0)

## 2021-02-07 LAB — COMPREHENSIVE METABOLIC PANEL, NON-FASTING
ALBUMIN/GLOBULIN RATIO: 1.6 (ref >=1.0)
ALBUMIN: 4 g/dL (ref 3.5–5.2)
ALKALINE PHOSPHATASE: 42 U/L (ref 41–133)
ALT (SGPT): 9 U/L (ref 0–55)
ANION GAP: 11 mmol/L (ref 6–15)
AST (SGOT): 10 U/L (ref 5–34)
BILIRUBIN TOTAL: 0.5 mg/dL (ref 0.2–1.2)
BUN: 15 mg/dL (ref 7–21)
CALCIUM: 9.4 mg/dL (ref 8.0–10.6)
CHLORIDE: 106 mmol/L (ref 98–107)
CO2 TOTAL: 25 mmol/L (ref 21–32)
CREATININE: 0.71 mg/dL — ABNORMAL LOW (ref 0.80–1.60)
ESTIMATED GFR: >60 mL/min/{1.73_m2}
GLUCOSE: 108 mg/dL — ABNORMAL HIGH (ref 70–100)
POTASSIUM: 3.8 mmol/L (ref 3.3–5.1)
PROTEIN TOTAL: 6.5 g/dL (ref 6.4–8.3)
SODIUM: 142 mmol/L (ref 136–146)

## 2021-02-07 LAB — THYROXINE, FREE (FREE T4): THYROXINE (T4), FREE: 1.47 ng/dL — ABNORMAL HIGH (ref 0.61–1.12)

## 2021-02-07 LAB — C-REACTIVE PROTEIN (CRP): CRP INFLAMMATION: <5 mg/L (ref <5.0)

## 2021-02-07 LAB — CREATINE KINASE (CK), TOTAL, SERUM: CREATINE KINASE: 22 U/L — ABNORMAL LOW (ref 29–168)

## 2021-02-07 LAB — THYROID STIMULATING HORMONE (SENSITIVE TSH): TSH: <0.01 u[IU]/mL — ABNORMAL LOW (ref 0.450–5.330)

## 2021-02-07 LAB — SEDIMENTATION RATE: ERYTHROCYTE SEDIMENTATION RATE (ESR): 38 mm/hr

## 2021-02-07 MED ORDER — PREDNISONE 10 MG TABLET
ORAL_TABLET | ORAL | 0 refills | Status: DC
Start: 2021-02-07 — End: 2021-02-21

## 2021-02-08 LAB — HGA1C (HEMOGLOBIN A1C WITH EST AVG GLUCOSE): HEMOGLOBIN A1C: 6.5 % — ABNORMAL HIGH (ref 4.0–6.0)

## 2021-02-08 LAB — LYME ANTIBODY PANEL WITH REFLEX: LYME AB SCREEN: <0.9 index

## 2021-02-09 LAB — RHEUMATOID FACTOR: RHEUMATOID FACTOR: <14 IU/mL (ref <14)

## 2021-02-09 LAB — T3 (TRIIODOTHYRONINE), FREE, SERUM: T3, FREE: 3.9 pg/mL (ref 2.3–4.2)

## 2021-02-11 LAB — ANA (ANTINUCLEAR ANTIBODIES), SERUM: ANA SCREEN, IFA: NEGATIVE

## 2021-02-12 ENCOUNTER — Other Ambulatory Visit (INDEPENDENT_AMBULATORY_CARE_PROVIDER_SITE_OTHER): Payer: Self-pay | Admitting: Family Medicine

## 2021-02-12 MED ORDER — LEVOTHYROXINE 100 MCG TABLET
100.0000 ug | ORAL_TABLET | Freq: Every morning | ORAL | 4 refills | Status: DC
Start: 2021-02-12 — End: 2022-04-28

## 2021-02-13 ENCOUNTER — Telehealth (INDEPENDENT_AMBULATORY_CARE_PROVIDER_SITE_OTHER): Payer: Self-pay | Admitting: Family Medicine

## 2021-02-13 NOTE — Telephone Encounter (Signed)
Spoke with patient about lab results.  Theone Stanley, [place]  02/13/2021, 14:02

## 2021-02-13 NOTE — Telephone Encounter (Signed)
-----   Message from [employer] DO sent at 02/12/2021  4:28 PM EST -----  her thyroid is very overactive and i am reducing her thyroid dose, this can cause muscle pain, how is she feeling. her other labs are normal

## 2021-02-20 ENCOUNTER — Encounter (INDEPENDENT_AMBULATORY_CARE_PROVIDER_SITE_OTHER): Payer: Self-pay | Admitting: Family Medicine

## 2021-02-21 ENCOUNTER — Other Ambulatory Visit (INDEPENDENT_AMBULATORY_CARE_PROVIDER_SITE_OTHER): Payer: Self-pay | Admitting: Family Medicine

## 2021-02-21 MED ORDER — PREDNISONE 10 MG TABLET
20.0000 mg | ORAL_TABLET | Freq: Every day | ORAL | 2 refills | Status: DC
Start: 2021-02-21 — End: 2021-03-20

## 2021-02-27 ENCOUNTER — Other Ambulatory Visit (INDEPENDENT_AMBULATORY_CARE_PROVIDER_SITE_OTHER): Payer: Self-pay

## 2021-02-27 DIAGNOSIS — Z7189 Other specified counseling: Secondary | ICD-10-CM

## 2021-02-27 NOTE — Progress Notes (Signed)
Population Health   Medication Therapy Management    Ms. Bieser is enrolled in [place] [employer] program, I reviewed patients chart for potential cost savings. At this time patient is no longer taking Ozempic, she is taking Janumet.    Merideth Abbey, PHARMD

## 2021-03-05 ENCOUNTER — Other Ambulatory Visit (INDEPENDENT_AMBULATORY_CARE_PROVIDER_SITE_OTHER): Payer: Self-pay | Admitting: Family Medicine

## 2021-03-05 MED ORDER — NIRMATRELVIR 300 MG (150 MG X2)-RITONAVIR 100 MG TABLET,DOSE PACK
3.0000 | ORAL_TABLET | Freq: Two times a day (BID) | ORAL | 0 refills | Status: AC
Start: 2021-03-05 — End: 2021-03-10

## 2021-03-08 ENCOUNTER — Encounter (INDEPENDENT_AMBULATORY_CARE_PROVIDER_SITE_OTHER): Payer: Self-pay | Admitting: Family Medicine

## 2021-03-19 NOTE — Progress Notes (Signed)
OUTPATIENT PROGRESS NOTE    Subjective:   Patient ID:  Linda [name] is a pleasant 76 y.o. female.    Chief Complaint: Follow Up      History of Present Illness:  She states that she has been doing much better and the prednisone dosage has been down to 20mg  daily and she is completely pain free. She states that her BS has been mildly elevated up to 170. Her breathing has been stable. She has been taking her medications as rx. She denies any cp or sob.     The history is provided by the patient.      Allergies:   No Known Allergies      Medications:   acetaminophen (TYLENOL) 500 mg Oral Tablet, Take 1 Tablet (500 mg total) by mouth Every 4 hours as needed for Pain  amLODIPine (NORVASC) 5 mg Oral Tablet, Take 1 Tablet (5 mg total) by mouth Once a day  Diclofenac Sodium 3 % Gel, Apply topically  glimepiride (AMARYL) 2 mg Oral Tablet, Take 1 Tablet (2 mg total) by mouth Every morning with breakfast  levETIRAcetam (KEPPRA) 500 mg Oral Tablet, Take 1 Tablet (500 mg total) by mouth Twice daily  levothyroxine (SYNTHROID) 100 mcg Oral Tablet, Take 1 Tablet (100 mcg total) by mouth Every morning  pravastatin (PRAVACHOL) 80 mg Oral Tablet, Take 1 Tablet (80 mg total) by mouth Once a day  propranoloL (INDERAL) 10 mg Oral Tablet, take 1 tablet by mouth twice a day  SITagliptin-metformin (JANUMET) 50-1,000 mg Oral Tablet, Take 1 Tablet by mouth Twice daily with food  Valsartan-Hydrochlorothiazide 160-12.5 mg Oral Tablet, Take 1 Tablet by mouth Once a day  cholecalciferol, vitamin D3, 1,250 mcg (50,000 unit) Oral Capsule, Take 1 Cap (50,000 Units total) by mouth Every 7 days (Patient not taking: No sig reported)  predniSONE (DELTASONE) 10 mg Oral Tablet, Take 2 Tablets (20 mg total) by mouth Once a day    No facility-administered medications prior to visit.        Immunization History:     Immunization History   Administered Date(s) Administered    Influenza Vaccine, 6 month-adult 12/19/2016, 01/12/2020, 12/13/2020    PREVNAR 13  12/19/2016         Past Medical History:     Past Medical History:   Diagnosis Date    Chronic pain of right ankle 12/19/2016    Essential hypertension, benign 12/17/2016    History of CVA (cerebrovascular accident) 09/12/2020    Hypothyroidism, adult 12/17/2016    Mixed hyperlipidemia 12/17/2016    Obstructive sleep apnea 12/17/2016    Seizure (CMS [place])     Stroke (CMS [facility])     Type 2 diabetes mellitus without complication, without long-term current use of insulin (CMS [place]) 12/17/2016             Past Surgical History:     Past Surgical History:   Procedure Laterality Date    HX APPENDECTOMY      HX BACK SURGERY      REPLACEMENT TOTAL KNEE      SHOULDER SURGERY      VEIN LIGATION               Family History:     Family Medical History:     Problem Relation (Age of Onset)    Leukemia Mother    Lymphoma Father              Social History:   Linda [name]  reports  that she has never smoked. She has never used smokeless tobacco. She reports that she does not drink alcohol and does not use drugs.          ROS:  as above in hpi      Objective:   Vitals:    Vitals:    03/20/21 0934   BP: 130/64   Pulse: 57   Temp: 36 C (96.8 F)   SpO2: 98%   Weight: 86.2 kg (190 lb)   Height: 1.676 m (5\' 6" )   BMI: 30.73          Body mass index is 30.67 kg/m.      Constitutional: Alert, well developed, well nourished  HEENT:  Head: NC/AT    Eyes: Sclera anicteric, conjunctiva not injected    Ears: EAC normal, bilateral TMs clear    Nose: No discharge    Throat: MMM, posterior pharynx without erythema or exudate  Neck:   Supple with normal ROM, no cervical LAD, no thyromegaly, no JVD, no carotid bruits  Cardiovascular: RRR, normal S1/S2, no murmurs/rubs/gallops  Pulmonary:  CTAB, equal air entry, nonlabored, no wheezes/crackles/rhonchi  Abdomen:   NABS, NT/ND, soft, no HSM, no masses  Musculoskeletal:  No new deformity, no injury, no edema  Neurological:   Alert, oriented x 3, no new abnormal tone  Skin:     Warm, pink, dry,  no rashes, no jaundice, no pallor, no cyanosis  Psychiatric:  Normal mood, affect, behavior, judgment, and thought content        Assessment & Plan:       ICD-10-CM    1. Polymyalgia rheumatica (CMS HCC)  M35.3       2. Essential hypertension, benign  I10 CBC/DIFF     COMPREHENSIVE METABOLIC PNL, FASTING      3. Mixed hyperlipidemia  E78.2 LIPID PANEL      4. Obstructive sleep apnea  G47.33       5. Arthralgia, unspecified joint  M25.50       6. History of CVA (cerebrovascular accident)  Z86.73       7. Lumbar radiculopathy  M54.16       8. Hypothyroidism, adult  E03.9 Sensitive TSH     T3, Free     T4, Free      9. Type 2 diabetes mellitus without complication, without long-term current use of insulin (CMS HCC)  E11.9 HGA1C (HEMOGLOBIN A1C WITH EST AVG GLUCOSE)      10. 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     I68.0           Orders Placed This Encounter    CBC/DIFF    COMPREHENSIVE METABOLIC PNL, FASTING    123456 (HEMOGLOBIN A1C WITH EST AVG GLUCOSE)    LIPID PANEL    Sensitive TSH    T3, Free    T4, Free    predniSONE (DELTASONE) 5 mg Oral Tablet                 Health Maintenance   Topic Date Due    Diabetic Retinal Exam  Never done    Osteoporosis screening  Never done    Hepatitis C screening  Never done    Covid-19 Vaccine (1) Never done    Diabetic Nephropathy Screening  Never done  Adult Tdap-Td (1 - Tdap) Never done    Mammography  Never done    Shingles Vaccine (1 of 2) Never done    Pneumococcal Vaccination, Age 65+ (2 - PPSV23 if available, else PCV20) 12/19/2017    Annual Wellness Visit - Calendar Year Insurers  03/10/2021    Diabetes A1C  08/08/2021    Depression Screening  12/13/2021    Colonoscopy  07/02/2025    Influenza Vaccine  Completed    Meningococcal Vaccine  Aged Out   I did discuss with the  patient and we did review her PMR and she has been doing very well with the prednisone and we did review the unknown course of the illness but the goal to lower the prednisone to the lowest effective dosage. She will try to reduce to 15mg  and then reduce by 5mg  every 2 weeks until sx would recur. If sx recur then she will increase back to the next dosage. We did change her synthroid dosage 1 month ago and she will have the labs repeated and return visit in 2 months. We did review that the prednisone will increase the BS but this should improve with the decrease in her dosage. She is agreeable. She will call with any issues/concerns.     Return in about 2 months (around 05/18/2021).    Junious Dresser [employer] DO

## 2021-03-20 ENCOUNTER — Other Ambulatory Visit: Payer: Self-pay

## 2021-03-20 ENCOUNTER — Ambulatory Visit (INDEPENDENT_AMBULATORY_CARE_PROVIDER_SITE_OTHER): Payer: Medicare PPO | Admitting: Family Medicine

## 2021-03-20 ENCOUNTER — Encounter (INDEPENDENT_AMBULATORY_CARE_PROVIDER_SITE_OTHER): Payer: Self-pay | Admitting: Family Medicine

## 2021-03-20 VITALS — BP 130/64 | HR 57 | Temp 96.8°F | Ht 66.0 in | Wt 190.0 lb

## 2021-03-20 DIAGNOSIS — E782 Mixed hyperlipidemia: Secondary | ICD-10-CM

## 2021-03-20 DIAGNOSIS — G4733 Obstructive sleep apnea (adult) (pediatric): Secondary | ICD-10-CM

## 2021-03-20 DIAGNOSIS — I10 Essential (primary) hypertension: Secondary | ICD-10-CM

## 2021-03-20 DIAGNOSIS — E039 Hypothyroidism, unspecified: Secondary | ICD-10-CM

## 2021-03-20 DIAGNOSIS — E119 Type 2 diabetes mellitus without complications: Secondary | ICD-10-CM

## 2021-03-20 DIAGNOSIS — M5416 Radiculopathy, lumbar region: Secondary | ICD-10-CM

## 2021-03-20 DIAGNOSIS — R569 Unspecified convulsions: Secondary | ICD-10-CM | POA: Insufficient documentation

## 2021-03-20 DIAGNOSIS — Z8673 Personal history of transient ischemic attack (TIA), and cerebral infarction without residual deficits: Secondary | ICD-10-CM

## 2021-03-20 DIAGNOSIS — M353 Polymyalgia rheumatica: Secondary | ICD-10-CM

## 2021-03-20 DIAGNOSIS — E854 Organ-limited amyloidosis: Secondary | ICD-10-CM

## 2021-03-20 DIAGNOSIS — M2550 Pain in unspecified joint: Secondary | ICD-10-CM

## 2021-03-20 DIAGNOSIS — I614 Nontraumatic intracerebral hemorrhage in cerebellum: Secondary | ICD-10-CM | POA: Insufficient documentation

## 2021-03-20 DIAGNOSIS — I680 Cerebral amyloid angiopathy: Secondary | ICD-10-CM | POA: Insufficient documentation

## 2021-03-20 MED ORDER — PREDNISONE 5 MG TABLET
20.0000 mg | ORAL_TABLET | Freq: Every day | ORAL | 3 refills | Status: DC
Start: 2021-03-20 — End: 2021-08-30

## 2021-04-19 ENCOUNTER — Other Ambulatory Visit (INDEPENDENT_AMBULATORY_CARE_PROVIDER_SITE_OTHER): Payer: Self-pay | Admitting: Family Medicine

## 2021-04-19 NOTE — Telephone Encounter (Signed)
LOV 03/20/21  NOV 05/17/21

## 2021-05-16 ENCOUNTER — Ambulatory Visit (INDEPENDENT_AMBULATORY_CARE_PROVIDER_SITE_OTHER): Payer: Medicare PPO

## 2021-05-16 ENCOUNTER — Other Ambulatory Visit: Payer: Medicare PPO | Attending: Family Medicine | Admitting: Family Medicine

## 2021-05-16 ENCOUNTER — Other Ambulatory Visit: Payer: Self-pay

## 2021-05-16 DIAGNOSIS — I10 Essential (primary) hypertension: Secondary | ICD-10-CM

## 2021-05-16 DIAGNOSIS — E782 Mixed hyperlipidemia: Secondary | ICD-10-CM | POA: Insufficient documentation

## 2021-05-16 DIAGNOSIS — E119 Type 2 diabetes mellitus without complications: Secondary | ICD-10-CM | POA: Insufficient documentation

## 2021-05-16 DIAGNOSIS — E039 Hypothyroidism, unspecified: Secondary | ICD-10-CM | POA: Insufficient documentation

## 2021-05-16 LAB — CBC WITH DIFF
BASOPHIL #: <0.1 10*3/uL (ref <=0.20)
BASOPHIL %: 1 %
EOSINOPHIL #: <0.1 10*3/uL (ref <=0.50)
EOSINOPHIL %: 0 %
HCT: 38.6 % (ref 34.8–46.0)
HGB: 12.7 g/dL (ref 11.5–16.0)
IMMATURE GRANULOCYTE #: <0.1 10*3/uL (ref <0.10)
IMMATURE GRANULOCYTE %: 0 % (ref 0–1)
LYMPHOCYTE #: 2.18 10*3/uL (ref 1.00–4.80)
LYMPHOCYTE %: 30 %
MCH: 32.4 pg — ABNORMAL HIGH (ref 26.0–32.0)
MCHC: 32.9 g/dL (ref 31.0–35.5)
MCV: 98.5 fL (ref 78.0–100.0)
MONOCYTE #: 0.67 10*3/uL (ref 0.20–1.10)
MONOCYTE %: 9 %
MPV: 10 fL (ref 8.7–12.5)
NEUTROPHIL #: 4.2 10*3/uL (ref 1.50–7.70)
NEUTROPHIL %: 60 %
PLATELETS: 229 10*3/uL (ref 150–400)
RBC: 3.92 10*6/uL (ref 3.85–5.22)
RDW-CV: 13.5 % (ref 11.5–15.5)
WBC: 7.2 10*3/uL (ref 3.7–11.0)

## 2021-05-16 LAB — COMPREHENSIVE METABOLIC PNL, FASTING
ALBUMIN/GLOBULIN RATIO: 1.5 (ref >=1.0)
ALBUMIN: 4 g/dL (ref 3.5–5.2)
ALKALINE PHOSPHATASE: 38 U/L — ABNORMAL LOW (ref 41–133)
ALT (SGPT): 11 U/L (ref 0–55)
ANION GAP: 9 mmol/L (ref 6–15)
AST (SGOT): 13 U/L (ref 5–34)
BILIRUBIN TOTAL: 0.6 mg/dL (ref 0.2–1.2)
BUN: 23 mg/dL — ABNORMAL HIGH (ref 7–21)
CALCIUM: 9.6 mg/dL (ref 8.0–10.6)
CHLORIDE: 102 mmol/L (ref 98–107)
CO2 TOTAL: 29 mmol/L (ref 21–32)
CREATININE: 0.91 mg/dL (ref 0.80–1.60)
ESTIMATED GFR: >60 mL/min/{1.73_m2}
GLUCOSE: 140 mg/dL — ABNORMAL HIGH (ref 70–100)
POTASSIUM: 3.5 mmol/L (ref 3.3–5.1)
PROTEIN TOTAL: 6.6 g/dL (ref 6.4–8.3)
SODIUM: 140 mmol/L (ref 136–146)

## 2021-05-16 LAB — THYROXINE, FREE (FREE T4): THYROXINE (T4), FREE: 1.05 ng/dL (ref 0.61–1.12)

## 2021-05-16 LAB — LIPID PANEL
CHOL/HDL RATIO: 2.6
CHOLESTEROL: 182 mg/dL (ref 75–200)
HDL CHOL: 69 mg/dL (ref 23–72)
LDL CALC: 84 mg/dL (ref 0–99)
TRIGLYCERIDES: 144 mg/dL (ref 28–153)
VLDL CALC: 29 mg/dL (ref 0–50)

## 2021-05-16 LAB — THYROID STIMULATING HORMONE (SENSITIVE TSH): TSH: 0.582 u[IU]/mL (ref 0.450–5.330)

## 2021-05-16 NOTE — Progress Notes (Signed)
OUTPATIENT PROGRESS NOTE    Subjective:   Patient ID:  Linda [name] is a pleasant 76 y.o. female.    Chief Complaint: Follow Up 3 Months      History of Present Illness:  DM: Fasting FS range 130-140   Nonfasting FS range not checking   Hypoglycemia episodes  No   Compliant with diabetic diet Yes   Sores on feet No  Diabetes Monitors  A1C: 7.1  A1C Date: 05/16/2021  Nephropathy Screening: Urine Microalbumin: Not Found Microalbumin Date: Not Found    Last Lipid Panel  (Last result in the past 2 years)      Cholesterol   HDL   LDL   Direct LDL   Triglycerides      05/16/21 0811 182   69   84     144          Retinal Exam Date: Not Found  Last Foot Exam: Not Found  HTN: HAs No    Dizziness No   Feeling like BP is too high or low No   Compliant with taking meds Yes   Home BP:  not doing  HLD: tolerating cholosterol lowering medication     "healthy" diet  in general   Is pt fasting today? No   Hypothyroidism: Hair changes No     Skin changes No     Diarrhea or constipation  No     Heat or cold intolerance  No     Palpitations no     Compliant with taking Synthroid Yes  She states that she was doing well with 15mg  of prednisone and when she went down to 10mg  some of her symptoms did recur so she did go back up to 15mg  and his did help. She denies any cp or sob. She has put on some weight and she is more hungry with the prednisone.  The history is provided by the patient.      Allergies:   No Known Allergies      Medications:   acetaminophen (TYLENOL) 500 mg Oral Tablet, Take 1 Tablet (500 mg total) by mouth Every 4 hours as needed for Pain  amLODIPine (NORVASC) 5 mg Oral Tablet, Take 1 Tablet (5 mg total) by mouth Once a day  Diclofenac Sodium 3 % Gel, Apply topically  glimepiride (AMARYL) 2 mg Oral Tablet, Take 1 Tablet (2 mg total) by mouth Every morning with breakfast  levETIRAcetam (KEPPRA) 500 mg Oral Tablet, Take 1 Tablet (500 mg total) by mouth Twice daily  levothyroxine (SYNTHROID) 100 mcg Oral Tablet, Take 1 Tablet  (100 mcg total) by mouth Every morning  pravastatin (PRAVACHOL) 80 mg Oral Tablet, Take 1 Tablet (80 mg total) by mouth Once a day  predniSONE (DELTASONE) 5 mg Oral Tablet, Take 4 Tablets (20 mg total) by mouth Once a day  propranoloL (INDERAL) 10 mg Oral Tablet, take 1 tablet by mouth twice a day  SITagliptin-metformin (JANUMET) 50-1,000 mg Oral Tablet, Take 1 Tablet by mouth Twice daily with food  Valsartan-Hydrochlorothiazide 160-12.5 mg Oral Tablet, Take 1 Tablet by mouth Once a day    No facility-administered medications prior to visit.        Immunization History:     Immunization History   Administered Date(s) Administered    Influenza Vaccine, 6 month-adult 12/19/2016, 01/12/2020, 12/13/2020    PREVNAR 13 12/19/2016         Past Medical History:     Past Medical History:   Diagnosis Date  Chronic pain of right ankle 12/19/2016    Essential hypertension, benign 12/17/2016    History of CVA (cerebrovascular accident) 09/12/2020    Hypothyroidism, adult 12/17/2016    Mixed hyperlipidemia 12/17/2016    Obstructive sleep apnea 12/17/2016    Seizure (CMS HCC)     Stroke (CMS HCC)     Type 2 diabetes mellitus without complication, without long-term current use of insulin (CMS HCC) 12/17/2016             Past Surgical History:     Past Surgical History:   Procedure Laterality Date    HX APPENDECTOMY      HX BACK SURGERY      REPLACEMENT TOTAL KNEE      SHOULDER SURGERY      VEIN LIGATION               Family History:     Family Medical History:     Problem Relation (Age of Onset)    Leukemia Mother    Lymphoma Father              Social History:   Linda [name]  reports that she has never smoked. She has never used smokeless tobacco. She reports that she does not drink alcohol and does not use drugs.          ROS:  as above in hpi    Objective:   Vitals:    Vitals:    05/17/21 0920   BP: 118/60   Pulse: 60   Temp: 35.7 C (96.3 F)   SpO2: 99%   Weight: 90.8 kg (200 lb 1.6 oz)   Height: 1.676 m (5\' 6" )    BMI: 32.36          Body mass index is 32.3 kg/m.      Constitutional: Alert, well developed, well nourished  HEENT:  Head: NC/AT    Eyes: Sclera anicteric, conjunctiva not injected    Ears: EAC normal, bilateral TMs clear    Nose: No discharge    Throat: MMM, posterior pharynx without erythema or exudate  Neck:   Supple with normal ROM, no cervical LAD, no thyromegaly, no JVD, no carotid bruits  Cardiovascular: RRR, normal S1/S2, no murmurs/rubs/gallops  Pulmonary:  CTAB, equal air entry, nonlabored, no wheezes/crackles/rhonchi  Abdomen:   NABS, NT/ND, soft, no HSM, no masses  Musculoskeletal:  No deformity, no injury, no edema  Neurological:   Alert, oriented x 3, no abnormal tone  Skin:     Warm, pink, dry, no rashes, no jaundice, no pallor, no cyanosis  Psychiatric:  Normal mood, affect, behavior, judgment, and thought content        Assessment & Plan:       ICD-10-CM    1. Essential hypertension, benign  I10 CBC/DIFF     COMPREHENSIVE METABOLIC PNL, FASTING      2. Mixed hyperlipidemia  E78.2 LIPID PANEL      3. Obstructive sleep apnea  G47.33       4. Polymyalgia rheumatica (CMS HCC)  M35.3 SEDIMENTATION RATE      5. 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 Convulsions, unspecified convulsion type (CMS HCC)  R56.9       7. 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 History of CVA (cerebrovascular accident)  Z86.73       9. New onset  of headaches  R51.9       10. Lumbar radiculopathy  M54.16       11. Hypothyroidism, adult  E03.9 Sensitive TSH     T4, Free      12. Type 2 diabetes mellitus without complication, without long-term current use of insulin (CMS HCC)  E11.9 HGA1C (HEMOGLOBIN A1C WITH EST AVG GLUCOSE)      13. Chronic pain of right ankle  M25.571     G89.29           Orders Placed This Encounter    CBC/DIFF    COMPREHENSIVE METABOLIC PNL, FASTING    LIPID PANEL    HGA1C (HEMOGLOBIN A1C WITH EST AVG GLUCOSE)     Sensitive TSH    T4, Free    SEDIMENTATION RATE                 Health Maintenance   Topic Date Due    Diabetic Retinal Exam  Never done    Osteoporosis screening  Never done    Hepatitis C screening  Never done    Covid-19 Vaccine (1) Never done    Diabetic Nephropathy Screening  Never done    Adult Tdap-Td (1 - Tdap) Never done    Shingles Vaccine (1 of 2) Never done    Pneumococcal Vaccination, Age 49+ (2 - PPSV23 if available, else PCV20) 02/13/2017    Annual Wellness Visit - Calendar Year Insurers  03/10/2021    Diabetes A1C  11/16/2021    Depression Screening  12/13/2021    Colonoscopy  07/02/2025    Influenza Vaccine  Completed    Meningococcal Vaccine  Aged Out   We did review patient diabetes status and need for continued diet, exercise and regular follow up with labs and A1c. We did rec yearly ophthalmology exams and also regular foot exam and did review foot care. Patient will continue to monitor blood glucose and call with any issues or concerns.  I did discuss with the patient and we did review her labs and her thyroid panel is now WNL. Her tchol is 182 and his LDL is 84. Her a1c did increase slightly to 7.1% and his BS has most likely been elevated by the prednisone. I did rec that she continue the prednisone but will continue to try and reduce the dosage as much as possible.  Her BP is stable. She will have regular follow up and labs every 3 months.   Return in about 3 months (around 08/17/2021).    Algis Downs [employer] DO

## 2021-05-17 ENCOUNTER — Ambulatory Visit (INDEPENDENT_AMBULATORY_CARE_PROVIDER_SITE_OTHER): Payer: Medicare PPO | Admitting: Family Medicine

## 2021-05-17 ENCOUNTER — Other Ambulatory Visit: Payer: Self-pay

## 2021-05-17 ENCOUNTER — Encounter (INDEPENDENT_AMBULATORY_CARE_PROVIDER_SITE_OTHER): Payer: Self-pay | Admitting: Family Medicine

## 2021-05-17 VITALS — BP 118/60 | HR 60 | Temp 96.3°F | Ht 66.0 in | Wt 200.1 lb

## 2021-05-17 DIAGNOSIS — E119 Type 2 diabetes mellitus without complications: Secondary | ICD-10-CM

## 2021-05-17 DIAGNOSIS — I614 Nontraumatic intracerebral hemorrhage in cerebellum: Secondary | ICD-10-CM

## 2021-05-17 DIAGNOSIS — M5416 Radiculopathy, lumbar region: Secondary | ICD-10-CM

## 2021-05-17 DIAGNOSIS — M353 Polymyalgia rheumatica: Secondary | ICD-10-CM

## 2021-05-17 DIAGNOSIS — E854 Organ-limited amyloidosis: Secondary | ICD-10-CM

## 2021-05-17 DIAGNOSIS — M25571 Pain in right ankle and joints of right foot: Secondary | ICD-10-CM

## 2021-05-17 DIAGNOSIS — I10 Essential (primary) hypertension: Secondary | ICD-10-CM

## 2021-05-17 DIAGNOSIS — G4733 Obstructive sleep apnea (adult) (pediatric): Secondary | ICD-10-CM

## 2021-05-17 DIAGNOSIS — R519 Headache, unspecified: Secondary | ICD-10-CM

## 2021-05-17 DIAGNOSIS — E782 Mixed hyperlipidemia: Secondary | ICD-10-CM

## 2021-05-17 DIAGNOSIS — Z8673 Personal history of transient ischemic attack (TIA), and cerebral infarction without residual deficits: Secondary | ICD-10-CM

## 2021-05-17 DIAGNOSIS — I680 Cerebral amyloid angiopathy: Secondary | ICD-10-CM

## 2021-05-17 DIAGNOSIS — R569 Unspecified convulsions: Secondary | ICD-10-CM

## 2021-05-17 DIAGNOSIS — G8929 Other chronic pain: Secondary | ICD-10-CM

## 2021-05-17 DIAGNOSIS — E039 Hypothyroidism, unspecified: Secondary | ICD-10-CM

## 2021-05-17 LAB — T3 (TRIIODOTHYRONINE), FREE, SERUM: T3 FREE: 2.1 pg/mL (ref 1.7–3.7)

## 2021-05-17 LAB — HGA1C (HEMOGLOBIN A1C WITH EST AVG GLUCOSE): HEMOGLOBIN A1C: 7.1 % — ABNORMAL HIGH (ref 4.0–6.0)

## 2021-07-16 ENCOUNTER — Other Ambulatory Visit (INDEPENDENT_AMBULATORY_CARE_PROVIDER_SITE_OTHER): Payer: Self-pay | Admitting: Family Medicine

## 2021-07-16 NOTE — Telephone Encounter (Signed)
Last Visit:05/17/2021     Upcoming appointments: 08/30/2021           Ralene Ok, RN  07/16/2021, 10:20

## 2021-08-29 NOTE — Progress Notes (Signed)
OUTPATIENT PROGRESS NOTE    Subjective:   Patient ID:  Linda [name] is a pleasant 76 y.o. female.    Chief Complaint: Hypertension      History of Present Illness:  DM: Fasting FS range not checking   Nonfasting FS range not checking   Hypoglycemia episodes  No   Compliant with diabetic diet Yes   Sores on feet No  Diabetes Monitors  A1C: 7.1  A1C Date: 05/16/2021  Kidney Health:   Urine Microalbumin/Cr Ratio Ur Microalb/Cr Ratio date   eGFR --66 eGFR date--05/16/2021    Last Lipid Panel  (Last result in the past 2 years)      Cholesterol   HDL   LDL   Direct LDL   Triglycerides      05/16/21 0811 182   69   84     144          Retinal Exam Date: Not Found DM Retinopathy - Negative Date  Last Foot Exam: Not Found   HTN: HAs No    Dizziness No   Feeling like BP is too high or low No   Compliant with taking meds Yes   Home BP:  not doing  HLD: tolerating cholosterol lowering medication     "healthy" diet  in general   Is pt fasting today? No   Hypothyroidism: Hair changes No     Skin changes No     Diarrhea or constipation  No     Heat or cold intolerance  No     Palpitations No     Compliant with taking Synthroid Yes  She has been doing well and she has been taking her medications as rx. She did have recent llq pain and she did go to er and she did have renal calculi. She did have then pass this 2 days later. She is on antibiotic for 7 days with this. She has gained some weight. She was on the glp1 but the cost was prohibitive. She did have yeast infection with the sglt2 medications. She denies any cp or sob. She does have the pmr and she is now down to 39m of prednisone daily.     The history is provided by the patient.      Allergies:   No Known Allergies      Medications:   acetaminophen (TYLENOL) 500 mg Oral Tablet, Take 1 Tablet (500 mg total) by mouth Every 4 hours as needed for Pain  amLODIPine (NORVASC) 5 mg Oral Tablet, Take 1 Tablet (5 mg total) by mouth Once a day  Diclofenac Sodium 3 % Gel, Apply  topically  glimepiride (AMARYL) 2 mg Oral Tablet, Take 1 Tablet (2 mg total) by mouth Every morning with breakfast  levothyroxine (SYNTHROID) 100 mcg Oral Tablet, Take 1 Tablet (100 mcg total) by mouth Every morning  propranoloL (INDERAL) 10 mg Oral Tablet, take 1 tablet by mouth twice a day  SITagliptin-metformin (JANUMET) 50-1,000 mg Oral Tablet, Take 1 Tablet by mouth Twice daily with food  levETIRAcetam (KEPPRA) 500 mg Oral Tablet, Take 1 Tablet (500 mg total) by mouth Twice daily  pravastatin (PRAVACHOL) 80 mg Oral Tablet, Take 1 Tablet (80 mg total) by mouth Once a day  predniSONE (DELTASONE) 5 mg Oral Tablet, Take 4 Tablets (20 mg total) by mouth Once a day  Valsartan-Hydrochlorothiazide 160-12.5 mg Oral Tablet, Take 1 Tablet by mouth Once a day    No facility-administered medications prior to visit.  Immunization History:     Immunization History   Administered Date(s) Administered   . Influenza Vaccine, 6 month-adult 12/19/2016, 01/12/2020, 12/13/2020   . PREVNAR 13 12/19/2016         Past Medical History:     Past Medical History:   Diagnosis Date   . Chronic pain of right ankle 12/19/2016   . Essential hypertension, benign 12/17/2016   . History of CVA (cerebrovascular accident) 09/12/2020   . Hypothyroidism, adult 12/17/2016   . Mixed hyperlipidemia 12/17/2016   . Obstructive sleep apnea 12/17/2016   . Seizure (CMS [place])    . Stroke (CMS [facility])    . Type 2 diabetes mellitus without complication, without long-term current use of insulin (CMS [place]) 12/17/2016             Past Surgical History:     Past Surgical History:   Procedure Laterality Date   . HX APPENDECTOMY     . HX BACK SURGERY     . REPLACEMENT TOTAL KNEE     . SHOULDER SURGERY     . VEIN LIGATION               Family History:     Family Medical History:     Problem Relation (Age of Onset)    Leukemia Mother    Lymphoma Father              Social History:   Bettyjean Stefanski  reports that she has never smoked. She has never used smokeless tobacco.  She reports that she does not drink alcohol and does not use drugs.          ROS:  As above in HPI, otherwise negative      Objective:   Vitals:    Vitals:    08/30/21 0804   BP: 122/64   Pulse: 60   Temp: 36.4 C (97.6 F)   SpO2: 97%   Weight: 96 kg (211 lb 9.6 oz)   Height: 1.676 m (5' 6" )   BMI: 34.22          Body mass index is 34.15 kg/m.      Constitutional: Alert, well developed, well nourished  HEENT:  Head: NC/AT    Eyes: Sclera anicteric, conjunctiva not injected    Ears: EAC normal, bilateral TMs clear    Nose: No discharge    Throat: MMM, posterior pharynx without erythema or exudate  Neck:   Supple with normal ROM, no cervical LAD, no thyromegaly, no JVD, no carotid bruits  Cardiovascular: RRR, normal S1/S2, no murmurs/rubs/gallops  Pulmonary:  CTAB, equal air entry, nonlabored, no wheezes/crackles/rhonchi  Abdomen:   NABS, NT/ND, soft, no HSM, no masses  Musculoskeletal:  No new deformity, no injury, no edema. varicose veins noted bilaterally  Neurological:   Alert, oriented x 3, no new abnormal tone  Skin:     Warm, pink, dry, no rashes, no jaundice, no pallor, no cyanosis  Psychiatric:  Normal mood, affect, behavior, judgment, and thought content        Assessment & Plan:       ICD-10-CM    1. Essential hypertension, benign  I10       2. Mixed hyperlipidemia  E78.2       3. Obstructive sleep apnea  G47.33       4. Polymyalgia rheumatica (CMS HCC)  M35.3       5. 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 Convulsions, unspecified convulsion type (CMS HCC)  R56.9       7. 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 History of CVA (cerebrovascular accident)  Z86.73       9. Lumbar radiculopathy  M54.16       10. Type 2 diabetes mellitus without complication, without long-term current use of insulin (CMS HCC)  E11.9       11. Hypothyroidism, adult  E03.9       12. Chronic pain of right ankle  M25.571     G89.29       13.  Personal history of fall  Z91.81       14. Loss of balance  R26.89       15. Vertigo  R42           Orders Placed This Encounter   . levETIRAcetam (KEPPRA) 500 mg Oral Tablet   . predniSONE (DELTASONE) 5 mg Oral Tablet   . pravastatin (PRAVACHOL) 80 mg Oral Tablet   . Valsartan-Hydrochlorothiazide 160-12.5 mg Oral Tablet                 Health Maintenance   Topic Date Due   . Diabetic Retinal Exam  Never done   . Osteoporosis screening  Never done   . Hepatitis C screening  Never done   . Covid-19 Vaccine (1) Never done   . Diabetic Kidney Health Microalb/Cr Ratio  Never done   . Adult Tdap-Td (1 - Tdap) Never done   . Shingles Vaccine (1 of 2) Never done   . Pneumococcal Vaccination, Age 13+ (2 - PPSV23 if available, else PCV20) 02/13/2017   . Annual Wellness Visit - Calendar Year Insurers  03/10/2021   . Diabetic A1C  11/16/2021   . Diabetic Kidney Health eGFR  05/17/2022   . Depression Screening  08/31/2022   . Colonoscopy  07/02/2025   . Influenza Vaccine  Completed   . Meningococcal Vaccine  Aged Out   We did review patient diabetes status and need for continued diet, exercise and regular follow up with labs and A1c. We did rec yearly ophthalmology exams and also regular foot exam and did review foot care. Patient will continue to monitor blood glucose and call with any issues or concerns.  I did discuss with the patient and she did pass the renal calculus and she is asymptomatic with this. We did review her medications and obviously would help to be able to use and sglt2 or glp1 but cannot for the above reasons. Her BP is stable. I did rec that she continue to taper the prednisone for the pmr. I did rec that she have the labs as above and will await results and intervene as needed. She will wear the support.   Return in about 3 months (around 11/30/2021).    Junious Dresser [employer] DO

## 2021-08-30 ENCOUNTER — Other Ambulatory Visit: Payer: Medicare PPO | Attending: Family Medicine | Admitting: Family Medicine

## 2021-08-30 ENCOUNTER — Encounter (INDEPENDENT_AMBULATORY_CARE_PROVIDER_SITE_OTHER): Payer: Self-pay | Admitting: Family Medicine

## 2021-08-30 ENCOUNTER — Other Ambulatory Visit: Payer: Self-pay

## 2021-08-30 ENCOUNTER — Ambulatory Visit (INDEPENDENT_AMBULATORY_CARE_PROVIDER_SITE_OTHER): Payer: Medicare PPO | Admitting: Family Medicine

## 2021-08-30 VITALS — BP 122/64 | HR 60 | Temp 97.6°F | Ht 66.0 in | Wt 211.6 lb

## 2021-08-30 DIAGNOSIS — E119 Type 2 diabetes mellitus without complications: Secondary | ICD-10-CM

## 2021-08-30 DIAGNOSIS — R2689 Other abnormalities of gait and mobility: Secondary | ICD-10-CM

## 2021-08-30 DIAGNOSIS — I680 Cerebral amyloid angiopathy: Secondary | ICD-10-CM

## 2021-08-30 DIAGNOSIS — G4733 Obstructive sleep apnea (adult) (pediatric): Secondary | ICD-10-CM

## 2021-08-30 DIAGNOSIS — M5416 Radiculopathy, lumbar region: Secondary | ICD-10-CM

## 2021-08-30 DIAGNOSIS — Z9181 History of falling: Secondary | ICD-10-CM

## 2021-08-30 DIAGNOSIS — E782 Mixed hyperlipidemia: Secondary | ICD-10-CM

## 2021-08-30 DIAGNOSIS — E039 Hypothyroidism, unspecified: Secondary | ICD-10-CM | POA: Insufficient documentation

## 2021-08-30 DIAGNOSIS — M353 Polymyalgia rheumatica: Secondary | ICD-10-CM

## 2021-08-30 DIAGNOSIS — Z8673 Personal history of transient ischemic attack (TIA), and cerebral infarction without residual deficits: Secondary | ICD-10-CM

## 2021-08-30 DIAGNOSIS — E854 Organ-limited amyloidosis: Secondary | ICD-10-CM

## 2021-08-30 DIAGNOSIS — G8929 Other chronic pain: Secondary | ICD-10-CM

## 2021-08-30 DIAGNOSIS — I614 Nontraumatic intracerebral hemorrhage in cerebellum: Secondary | ICD-10-CM

## 2021-08-30 DIAGNOSIS — I10 Essential (primary) hypertension: Secondary | ICD-10-CM

## 2021-08-30 DIAGNOSIS — M25571 Pain in right ankle and joints of right foot: Secondary | ICD-10-CM

## 2021-08-30 DIAGNOSIS — R42 Dizziness and giddiness: Secondary | ICD-10-CM

## 2021-08-30 DIAGNOSIS — R569 Unspecified convulsions: Secondary | ICD-10-CM

## 2021-08-30 LAB — LIPID PANEL
CHOL/HDL RATIO: 3
CHOLESTEROL: 173 mg/dL (ref 100–200)
HDL CHOL: 57 mg/dL (ref >=50)
LDL CALC: 95 mg/dL (ref <100)
NON-HDL: 116 mg/dL (ref <=190)
TRIGLYCERIDES: 120 mg/dL (ref <150)
VLDL CALC: 19 mg/dL (ref <30)

## 2021-08-30 LAB — CBC WITH DIFF
BASOPHIL #: <0.1 10*3/uL (ref <=0.20)
BASOPHIL %: 1 %
EOSINOPHIL #: <0.1 10*3/uL (ref <=0.50)
EOSINOPHIL %: 1 %
HCT: 37.5 % (ref 34.8–46.0)
HGB: 11.9 g/dL (ref 11.5–16.0)
IMMATURE GRANULOCYTE #: <0.1 10*3/uL (ref <0.10)
IMMATURE GRANULOCYTE %: 0 % (ref 0–1)
LYMPHOCYTE #: 2.01 10*3/uL (ref 1.00–4.80)
LYMPHOCYTE %: 34 %
MCH: 31.3 pg (ref 26.0–32.0)
MCHC: 31.7 g/dL (ref 31.0–35.5)
MCV: 98.7 fL (ref 78.0–100.0)
MONOCYTE #: 0.52 10*3/uL (ref 0.20–1.10)
MONOCYTE %: 9 %
MPV: 10.1 fL (ref 8.7–12.5)
NEUTROPHIL #: 3.19 10*3/uL (ref 1.50–7.70)
NEUTROPHIL %: 55 %
PLATELETS: 189 10*3/uL (ref 150–400)
RBC: 3.8 10*6/uL — ABNORMAL LOW (ref 3.85–5.22)
RDW-CV: 13.3 % (ref 11.5–15.5)
WBC: 5.8 10*3/uL (ref 3.7–11.0)

## 2021-08-30 LAB — COMPREHENSIVE METABOLIC PNL, FASTING
ALBUMIN: 3.7 g/dL (ref 3.4–4.8)
ALKALINE PHOSPHATASE: 42 U/L — ABNORMAL LOW (ref 55–145)
ALT (SGPT): 10 U/L (ref 8–22)
ANION GAP: 11 mmol/L (ref 4–13)
AST (SGOT): 15 U/L (ref 8–45)
BILIRUBIN TOTAL: 0.6 mg/dL (ref 0.3–1.3)
BUN/CREA RATIO: 18 (ref 6–22)
BUN: 15 mg/dL (ref 8–25)
CALCIUM: 9.7 mg/dL (ref 8.8–10.2)
CHLORIDE: 105 mmol/L (ref 96–111)
CO2 TOTAL: 27 mmol/L (ref 23–31)
CREATININE: 0.85 mg/dL (ref 0.60–1.05)
ESTIMATED GFR: 71 mL/min/BSA (ref >=60)
GLUCOSE: 132 mg/dL — ABNORMAL HIGH (ref 70–99)
POTASSIUM: 4.2 mmol/L (ref 3.5–5.1)
PROTEIN TOTAL: 6.5 g/dL (ref 6.0–8.0)
SODIUM: 143 mmol/L (ref 136–145)

## 2021-08-30 LAB — THYROXINE, FREE (FREE T4): THYROXINE (T4), FREE: 1.14 ng/dL (ref 0.70–1.25)

## 2021-08-30 LAB — HGA1C (HEMOGLOBIN A1C WITH EST AVG GLUCOSE)
ESTIMATED AVERAGE GLUCOSE: 157 mg/dL
HEMOGLOBIN A1C: 7.1 % — ABNORMAL HIGH (ref <=5.7)

## 2021-08-30 LAB — SEDIMENTATION RATE: ERYTHROCYTE SEDIMENTATION RATE (ESR): 11 mm/hr

## 2021-08-30 LAB — THYROID STIMULATING HORMONE (SENSITIVE TSH): TSH: 0.197 u[IU]/mL — ABNORMAL LOW (ref 0.430–3.550)

## 2021-08-30 MED ORDER — PREDNISONE 5 MG TABLET
10.0000 mg | ORAL_TABLET | Freq: Every day | ORAL | 5 refills | Status: DC
Start: 2021-08-30 — End: 2022-04-28

## 2021-08-30 MED ORDER — VALSARTAN 160 MG-HYDROCHLOROTHIAZIDE 12.5 MG TABLET
1.0000 | ORAL_TABLET | Freq: Every day | ORAL | 4 refills | Status: DC
Start: 2021-08-30 — End: 2022-09-29

## 2021-08-30 MED ORDER — PRAVASTATIN 80 MG TABLET
80.0000 mg | ORAL_TABLET | Freq: Every day | ORAL | 1 refills | Status: DC
Start: 2021-08-30 — End: 2022-03-13

## 2021-08-30 MED ORDER — LEVETIRACETAM 500 MG TABLET
500.0000 mg | ORAL_TABLET | Freq: Two times a day (BID) | ORAL | 3 refills | Status: DC
Start: 2021-08-30 — End: 2023-04-14

## 2021-08-30 NOTE — Nursing Note (Signed)
08/30/21 0804   Depression Screen   Little interest or pleasure in doing things. 0   Feeling down, depressed, or hopeless 0   PHQ 2 Total 0

## 2021-09-03 ENCOUNTER — Telehealth (INDEPENDENT_AMBULATORY_CARE_PROVIDER_SITE_OTHER): Payer: Self-pay | Admitting: Family Medicine

## 2021-09-03 NOTE — Telephone Encounter (Signed)
Left message for patient about lab results  Linda [name], [place]  09/03/2021 13:27

## 2021-09-03 NOTE — Telephone Encounter (Signed)
-----   Message from [employer] DO sent at 09/02/2021  7:35 AM EDT -----  a1c is stable at 7.1%, cholesterol improved, thyroid is very mildly overactive again but will continue same dosage and repeat at next visit

## 2021-09-25 ENCOUNTER — Other Ambulatory Visit (INDEPENDENT_AMBULATORY_CARE_PROVIDER_SITE_OTHER): Payer: Self-pay | Admitting: Family Medicine

## 2021-09-25 MED ORDER — PROPRANOLOL 10 MG TABLET
10.0000 mg | ORAL_TABLET | Freq: Two times a day (BID) | ORAL | 3 refills | Status: DC
Start: 2021-09-25 — End: 2022-10-08

## 2021-12-02 ENCOUNTER — Ambulatory Visit (INDEPENDENT_AMBULATORY_CARE_PROVIDER_SITE_OTHER): Payer: Medicare PPO

## 2021-12-02 ENCOUNTER — Other Ambulatory Visit: Payer: Self-pay

## 2021-12-02 ENCOUNTER — Other Ambulatory Visit: Payer: Medicare PPO | Attending: Family Medicine | Admitting: Family Medicine

## 2021-12-02 DIAGNOSIS — E782 Mixed hyperlipidemia: Secondary | ICD-10-CM | POA: Insufficient documentation

## 2021-12-02 DIAGNOSIS — E039 Hypothyroidism, unspecified: Secondary | ICD-10-CM | POA: Insufficient documentation

## 2021-12-02 DIAGNOSIS — I10 Essential (primary) hypertension: Secondary | ICD-10-CM

## 2021-12-02 DIAGNOSIS — E119 Type 2 diabetes mellitus without complications: Secondary | ICD-10-CM

## 2021-12-02 LAB — CBC WITH DIFF
BASOPHIL #: <0.1 10*3/uL (ref <=0.20)
BASOPHIL %: 1 %
EOSINOPHIL %: 0 %
IMMATURE GRANULOCYTE #: <0.1 10*3/uL (ref <0.10)
IMMATURE GRANULOCYTE %: 1 % (ref 0–1)
LYMPHOCYTE #: 2.25 10*3/uL (ref 1.00–4.80)
MCH: 31.9 pg (ref 26.0–32.0)
MCHC: 32.5 g/dL (ref 31.0–35.5)
MONOCYTE #: 0.66 10*3/uL (ref 0.20–1.10)
MONOCYTE %: 8 %
NEUTROPHIL #: 4.8 10*3/uL (ref 1.50–7.70)
PLATELETS: 246 10*3/uL (ref 150–400)
RBC: 3.92 10*6/uL (ref 3.85–5.22)
RDW-CV: 13.6 % (ref 11.5–15.5)
WBC: 7.9 10*3/uL (ref 3.7–11.0)

## 2021-12-02 LAB — COMPREHENSIVE METABOLIC PNL, FASTING
ALBUMIN: 4 g/dL (ref 3.4–4.8)
ANION GAP: 8 mmol/L (ref 4–13)
AST (SGOT): 15 U/L (ref 8–45)
BILIRUBIN TOTAL: 0.9 mg/dL (ref 0.3–1.3)
BUN/CREA RATIO: 24 — ABNORMAL HIGH (ref 6–22)
CALCIUM: 9.9 mg/dL (ref 8.6–10.3)
CHLORIDE: 103 mmol/L (ref 96–111)
CO2 TOTAL: 29 mmol/L (ref 23–31)
ESTIMATED GFR: 64 mL/min/BSA (ref >=60)
GLUCOSE: 187 mg/dL — ABNORMAL HIGH (ref 70–99)
PROTEIN TOTAL: 7.2 g/dL (ref 6.0–8.0)
SODIUM: 140 mmol/L (ref 136–145)

## 2021-12-02 LAB — LIPID PANEL
CHOL/HDL RATIO: 3.4
HDL CHOL: 62 mg/dL (ref >=50)
LDL CALC: 118 mg/dL — ABNORMAL HIGH (ref <100)
VLDL CALC: 32 mg/dL — ABNORMAL HIGH (ref <30)

## 2021-12-02 LAB — THYROID STIMULATING HORMONE (SENSITIVE TSH): TSH: 0.225 u[IU]/mL — ABNORMAL LOW (ref 0.350–4.940)

## 2021-12-02 LAB — HGA1C (HEMOGLOBIN A1C WITH EST AVG GLUCOSE): HEMOGLOBIN A1C: 7.9 % — ABNORMAL HIGH (ref <=5.7)

## 2021-12-02 NOTE — Nursing Note (Signed)
Department of Community Practice     Venipuncture performed in office on right arm antecubital vein, dry pressure dressing was applied to site and patient tolerated it well.  Specimen was centrifuged, aliquoted as needed and specimen was labeled and packaged for transport.    Dondra Prader, [place]  12/02/2021, 10:23

## 2021-12-03 ENCOUNTER — Telehealth (INDEPENDENT_AMBULATORY_CARE_PROVIDER_SITE_OTHER): Payer: Self-pay | Admitting: Family Medicine

## 2021-12-03 NOTE — Telephone Encounter (Signed)
Left message for patient about lab results  Linda [name], [place]  12/03/2021 14:59

## 2021-12-03 NOTE — Telephone Encounter (Signed)
-----   Message from [employer] DO sent at 12/03/2021  7:33 AM EDT -----  Thyroid is mildly overactive and her BS and her cholesterol did increase slightly so have her watch diet and keep follow up to review.

## 2021-12-04 NOTE — Progress Notes (Signed)
OUTPATIENT PROGRESS NOTE    Subjective:   Patient ID:  Linda [name] is a pleasant 76 y.o. female.    Chief Complaint: Hypertension      History of Present Illness:  DM: Fasting FS range not checking    Nonfasting FS range not checking   Hypoglycemia episodes  No   Compliant with diabetic diet Yes   Sores on feet No  Diabetes Monitors  A1C: 7.9  A1C Date: 12/02/2021  Kidney Health:   Urine Microalbumin/Cr Ratio Ur Microalb/Cr Ratio date   eGFR --64 eGFR date--12/02/2021    Last Lipid Panel  (Last result in the past 2 years)        Cholesterol   HDL   LDL   Direct LDL   Triglycerides      12/02/21 1009 212   62   118  Comment: <100 mg/dL, Optimal  100-129 mg/dL, Near/Above Optimal  130-159 mg/dL, Borderline High  160-189 mg/dL, High  >=190 mg/dL, Very high     187            Retinal Exam Date: Not Found :   Last Foot Exam: Not Found   HTN: HAs No    Dizziness No   Feeling like BP is too high or low No   Compliant with taking meds Yes   Home BP:  not doing   HLD: tolerating cholosterol lowering medication     "healthy" diet  in general   Is pt fasting today? No   Hypothyroidism: Hair changes No     Skin changes No     Diarrhea or constipation  No     Heat or cold intolerance  No     Palpitations No     Compliant with taking Synthroid Yes   She did have recent fall where she did have her foot get caught on the carpet and she did fall and she did hit her head. She did see neurology and she is to have ct scan. She really doesn't get headaches any longer. She is taking her other medications as rx. She is trying to keep the prednisone dose low and she will feels that her BS is elevated due to this. She denies any hypoglycemic control. She is still very tired and she is not sleeping very well and she does get up and then cannot go back to sleep. She cannot afford the glp 1 due to cost.   The history is provided by the patient.      Allergies:   No Known Allergies      Medications:   acetaminophen (TYLENOL) 500 mg Oral Tablet,  Take 1 Tablet (500 mg total) by mouth Every 4 hours as needed for Pain  amLODIPine (NORVASC) 5 mg Oral Tablet, Take 1 Tablet (5 mg total) by mouth Once a day  Diclofenac Sodium 3 % Gel, Apply topically  levETIRAcetam (KEPPRA) 500 mg Oral Tablet, Take 1 Tablet (500 mg total) by mouth Twice daily  levothyroxine (SYNTHROID) 100 mcg Oral Tablet, Take 1 Tablet (100 mcg total) by mouth Every morning  pravastatin (PRAVACHOL) 80 mg Oral Tablet, Take 1 Tablet (80 mg total) by mouth Once a day  predniSONE (DELTASONE) 5 mg Oral Tablet, Take 2 Tablets (10 mg total) by mouth Once a day  propranoloL (INDERAL) 10 mg Oral Tablet, Take 1 Tablet (10 mg total) by mouth Twice daily  SITagliptin-metformin (JANUMET) 50-1,000 mg Oral Tablet, Take 1 Tablet by mouth Twice daily with food  Valsartan-Hydrochlorothiazide 160-12.5 mg  Oral Tablet, Take 1 Tablet by mouth Once a day  glimepiride (AMARYL) 2 mg Oral Tablet, Take 1 Tablet (2 mg total) by mouth Every morning with breakfast    No facility-administered medications prior to visit.        Immunization History:     Immunization History   Administered Date(s) Administered    Influenza Vaccine, 6 month-adult 12/19/2016, 01/12/2020, 12/13/2020    PREVNAR 13 12/19/2016         Past Medical History:     Past Medical History:   Diagnosis Date    Chronic pain of right ankle 12/19/2016    Essential hypertension, benign 12/17/2016    History of CVA (cerebrovascular accident) 09/12/2020    Hypothyroidism, adult 12/17/2016    Mixed hyperlipidemia 12/17/2016    Obstructive sleep apnea 12/17/2016    Seizure (CMS [place])     Stroke (CMS HCC)     Type 2 diabetes mellitus without complication, without long-term current use of insulin (CMS [place]) 12/17/2016             Past Surgical History:     Past Surgical History:   Procedure Laterality Date    HX APPENDECTOMY      HX BACK SURGERY      REPLACEMENT TOTAL KNEE      SHOULDER SURGERY      VEIN LIGATION               Family History:     Family Medical History:        Problem Relation (Age of Onset)    Leukemia Mother    Lymphoma Father                Social History:   Linda [name]  reports that she has never smoked. She has never used smokeless tobacco. She reports that she does not drink alcohol and does not use drugs.          ROS:  As above in HPI, otherwise negative      Objective:   Vitals:    Vitals:    12/05/21 1031   BP: 130/60   Pulse: 60   Temp: (!) 35.9 C (96.7 F)   SpO2: 94%   Weight: 97.3 kg (214 lb 9.6 oz)   Height: 1.676 m (5' 6" )   BMI: 34.71          Body mass index is 34.64 kg/m.      Constitutional: Alert, well developed, well nourished  HEENT:  Head: NC/AT    Eyes: Sclera anicteric, conjunctiva not injected    Ears: EAC normal, bilateral TMs clear    Nose: No discharge    Throat: MMM, posterior pharynx without erythema or exudate  Neck:   Supple with normal ROM, no cervical LAD, no thyromegaly, no JVD, no carotid bruits  Cardiovascular: RRR, normal S1/S2, no murmurs/rubs/gallops  Pulmonary:  CTAB, equal air entry, nonlabored, no wheezes/crackles/rhonchi  Abdomen:   NABS, NT/ND, soft, no HSM, no masses  Musculoskeletal:  No deformity, no injury, no edema  Neurological:   Alert, oriented x 3, no abnormal tone  Skin:     Warm, pink, dry, no rashes, no jaundice, no pallor, no cyanosis  Psychiatric:  Normal mood, affect, behavior, judgment, and thought content        Assessment & Plan:       ICD-10-CM    1. Essential hypertension, benign  I10 CBC/DIFF     COMPREHENSIVE METABOLIC PNL, FASTING  2. Mixed hyperlipidemia  E78.2 LIPID PANEL      3. Obstructive sleep apnea  G47.33       4. 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 Convulsions, unspecified convulsion type (CMS HCC)  R56.9       6. History of CVA (cerebrovascular accident)  Z86.73       7. Arthralgia, unspecified joint  M25.50       8. Lumbar radiculopathy  M54.16       9. Polymyalgia rheumatica (CMS HCC)  M35.3       10. Hypothyroidism, adult  E03.9 Sensitive TSH     T3,  Free     T4, Free      11. Type 2 diabetes mellitus without complication, without long-term current use of insulin (CMS HCC)  E11.9 HGA1C (HEMOGLOBIN A1C WITH EST AVG GLUCOSE)          Orders Placed This Encounter    CBC/DIFF    COMPREHENSIVE METABOLIC PNL, FASTING    HUT6L (HEMOGLOBIN A1C WITH EST AVG GLUCOSE)    LIPID PANEL    Sensitive TSH    T3, Free    T4, Free    traZODone (DESYREL) 100 mg Oral Tablet    glimepiride (AMARYL) 2 mg Oral Tablet                 Health Maintenance   Topic Date Due    Diabetic Retinal Exam  Never done    Osteoporosis screening  Never done    Hepatitis C screening  Never done    Covid-19 Vaccine (1) Never done    Diabetic Kidney Health Microalb/Cr Ratio  Never done    Adult Tdap-Td (1 - Tdap) Never done    Shingles Vaccine (1 of 2) Never done    Pneumococcal Vaccination, Age 29+ (2 - PPSV23 or PCV20) 02/13/2017    Annual Wellness Visit - Calendar Year Insurers  03/10/2021    Influenza Vaccine (1) 11/08/2021    Diabetic A1C  06/02/2022    Depression Screening  08/31/2022    Diabetic Kidney Health eGFR  12/03/2022    Colonoscopy  07/02/2025    Meningococcal Vaccine  Aged Out   We did review patient diabetes status and need for continued diet, exercise and regular follow up with labs and A1c. We did rec yearly ophthalmology exams and also regular foot exam and did review foot care. Patient will continue to monitor blood glucose and call with any issues or concerns.   I did discuss with the patient and she will try to continue and wean the prednisone and monitor her pain level with this. I did review her labs and her a1c did increase to 7.9% and this is most likely due to prednisone. Will temporarily increase the Amaryl to 77m daily and then as BS does decrease. Cost is a factor and cannot rx the glp1 or the sglt2 due to cost. I did review her TSH is suppressed but she is fatigued and will continue same dose and will try treating the insomnia. I did rec that she continue her other  medications and follow up in 3 months with labs repeated. She will have the ct of the head. Her BP is stable.   Return in about 3 months (around 03/06/2022).    PJunious [employer], DO

## 2021-12-05 ENCOUNTER — Other Ambulatory Visit: Payer: Self-pay

## 2021-12-05 ENCOUNTER — Ambulatory Visit (INDEPENDENT_AMBULATORY_CARE_PROVIDER_SITE_OTHER): Payer: Medicare PPO | Admitting: Family Medicine

## 2021-12-05 ENCOUNTER — Encounter (INDEPENDENT_AMBULATORY_CARE_PROVIDER_SITE_OTHER): Payer: Self-pay | Admitting: Family Medicine

## 2021-12-05 VITALS — BP 130/60 | HR 60 | Temp 96.7°F | Ht 66.0 in | Wt 214.6 lb

## 2021-12-05 DIAGNOSIS — Z8673 Personal history of transient ischemic attack (TIA), and cerebral infarction without residual deficits: Secondary | ICD-10-CM

## 2021-12-05 DIAGNOSIS — M2550 Pain in unspecified joint: Secondary | ICD-10-CM

## 2021-12-05 DIAGNOSIS — I10 Essential (primary) hypertension: Secondary | ICD-10-CM

## 2021-12-05 DIAGNOSIS — M353 Polymyalgia rheumatica: Secondary | ICD-10-CM

## 2021-12-05 DIAGNOSIS — E782 Mixed hyperlipidemia: Secondary | ICD-10-CM

## 2021-12-05 DIAGNOSIS — E854 Organ-limited amyloidosis: Secondary | ICD-10-CM

## 2021-12-05 DIAGNOSIS — I680 Cerebral amyloid angiopathy: Secondary | ICD-10-CM

## 2021-12-05 DIAGNOSIS — M5416 Radiculopathy, lumbar region: Secondary | ICD-10-CM

## 2021-12-05 DIAGNOSIS — R569 Unspecified convulsions: Secondary | ICD-10-CM

## 2021-12-05 DIAGNOSIS — E119 Type 2 diabetes mellitus without complications: Secondary | ICD-10-CM

## 2021-12-05 DIAGNOSIS — E039 Hypothyroidism, unspecified: Secondary | ICD-10-CM

## 2021-12-05 DIAGNOSIS — G4733 Obstructive sleep apnea (adult) (pediatric): Secondary | ICD-10-CM

## 2021-12-05 MED ORDER — TRAZODONE 100 MG TABLET
100.0000 mg | ORAL_TABLET | Freq: Every evening | ORAL | 4 refills | Status: DC
Start: 2021-12-05 — End: 2022-06-18

## 2021-12-05 MED ORDER — GLIMEPIRIDE 2 MG TABLET
4.0000 mg | ORAL_TABLET | Freq: Every morning | ORAL | 3 refills | Status: DC
Start: 2021-12-05 — End: 2023-07-23

## 2021-12-06 ENCOUNTER — Telehealth (INDEPENDENT_AMBULATORY_CARE_PROVIDER_SITE_OTHER): Payer: Self-pay

## 2021-12-09 NOTE — Telephone Encounter (Deleted)
Comprehensive Medication Review    Name: Linda [name]   MRN: [id number]   DOB: 01-14-1946   Age: 76 y.o.   Date: 12/09/2021     Telephone Encounter  I left a message for Kamyia Thomason today in regards to completing a medication review as part of the [facility] Quality Blue/True Performance program.   She recently saw her PCP  and a medication review was completed at that time.     I reviewed medication dispense history, I see  lisinopril/HCTZ  was filled on 11/18/21 and valsartan/HCTZ on 11/22/21 at different pharmacies.    Valsartan/HCTZ is on mediation list.      A/P:  1. Medication Review:                 -Medication list is updated and accurate in the PharmMD platform and reconciled with Epic list. Medication lists have been sent to patient and the provider.    Thanks!  Madelynn Done, PHARMD

## 2021-12-12 NOTE — Telephone Encounter (Signed)
Comprehensive Medication Review    Name: Linda [name]   MRN: [id number]   DOB: 04-Aug-1945   Age: 76 y.o.   Date: 12/12/2021     Telephone Encounter  I spoke with Linda [name] today and completed a comprehensive medication review as part of the [place] Blue/[employer] program. She has a good understanding of her medications and does not have any questions at this time.     She only has valsartan/HCTZ at home, she is not filling medications at [facility] and did not pick up lisinopril/HCTZ     A/P:  1. Medication Review:                 -Medication list is updated and accurate in the PharmMD platform and reconciled with Epic list. Medication lists have been sent to patient and the provider.    Thanks!  Madelynn Done, PHARMD

## 2022-03-13 ENCOUNTER — Other Ambulatory Visit (INDEPENDENT_AMBULATORY_CARE_PROVIDER_SITE_OTHER): Payer: Self-pay | Admitting: Family Medicine

## 2022-03-13 NOTE — Telephone Encounter (Signed)
Regarding: refills  ----- Message from [name] sent at 03/13/2022 10:04 AM EST -----  [name], DO    Refills    amLODIPine (NORVASC) 5 mg Oral Tablet    pravastatin (PRAVACHOL) 80 mg Oral Tablet    Preferred Pharmacy     [facility] [place] [facility] [place]    [place] [address]    Phone: [phone] Fax: [phone]    Hours: Not open 24 hours

## 2022-03-17 ENCOUNTER — Other Ambulatory Visit: Payer: Medicare PPO | Attending: Family Medicine | Admitting: Family Medicine

## 2022-03-17 ENCOUNTER — Other Ambulatory Visit: Payer: Self-pay

## 2022-03-17 ENCOUNTER — Ambulatory Visit (INDEPENDENT_AMBULATORY_CARE_PROVIDER_SITE_OTHER): Payer: Medicare PPO

## 2022-03-17 DIAGNOSIS — I10 Essential (primary) hypertension: Secondary | ICD-10-CM

## 2022-03-17 DIAGNOSIS — E039 Hypothyroidism, unspecified: Secondary | ICD-10-CM

## 2022-03-17 DIAGNOSIS — E782 Mixed hyperlipidemia: Secondary | ICD-10-CM | POA: Insufficient documentation

## 2022-03-17 DIAGNOSIS — E119 Type 2 diabetes mellitus without complications: Secondary | ICD-10-CM | POA: Insufficient documentation

## 2022-03-17 LAB — LIPID PANEL
CHOL/HDL RATIO: 3.2
CHOLESTEROL: 210 mg/dL — ABNORMAL HIGH (ref 100–200)
HDL CHOL: 65 mg/dL (ref >=50)
LDL CALC: 112 mg/dL — ABNORMAL HIGH (ref <100)
NON-HDL: 145 mg/dL (ref <=190)
TRIGLYCERIDES: 195 mg/dL — ABNORMAL HIGH (ref <150)
VLDL CALC: 33 mg/dL — ABNORMAL HIGH (ref <30)

## 2022-03-17 LAB — CBC WITH DIFF
BASOPHIL #: <0.1 10*3/uL (ref <=0.20)
BASOPHIL %: 1 %
EOSINOPHIL #: <0.1 10*3/uL (ref <=0.50)
EOSINOPHIL %: 1 %
HCT: 38.8 % (ref 34.8–46.0)
HGB: 12.7 g/dL (ref 11.5–16.0)
IMMATURE GRANULOCYTE #: <0.1 10*3/uL (ref <0.10)
IMMATURE GRANULOCYTE %: 0 % (ref 0.0–1.0)
LYMPHOCYTE #: 2.56 10*3/uL (ref 1.00–4.80)
LYMPHOCYTE %: 34 %
MCH: 32.2 pg — ABNORMAL HIGH (ref 26.0–32.0)
MCHC: 32.7 g/dL (ref 31.0–35.5)
MCV: 98.2 fL (ref 78.0–100.0)
MONOCYTE #: 0.53 10*3/uL (ref 0.20–1.10)
MONOCYTE %: 7 %
MPV: 9.6 fL (ref 8.7–12.5)
NEUTROPHIL #: 4.24 10*3/uL (ref 1.50–7.70)
NEUTROPHIL %: 57 %
PLATELETS: 234 10*3/uL (ref 150–400)
RBC: 3.95 10*6/uL (ref 3.85–5.22)
RDW-CV: 12.9 % (ref 11.5–15.5)
WBC: 7.5 10*3/uL (ref 3.7–11.0)

## 2022-03-17 LAB — COMPREHENSIVE METABOLIC PNL, FASTING
ALBUMIN: 3.8 g/dL (ref 3.4–4.8)
ALKALINE PHOSPHATASE: 43 U/L — ABNORMAL LOW (ref 55–145)
ALT (SGPT): 11 U/L (ref 8–22)
ANION GAP: 6 mmol/L (ref 4–13)
AST (SGOT): 14 U/L (ref 8–45)
BILIRUBIN TOTAL: 0.5 mg/dL (ref 0.3–1.3)
BUN/CREA RATIO: 19 (ref 6–22)
BUN: 19 mg/dL (ref 8–25)
CALCIUM: 10.1 mg/dL (ref 8.6–10.3)
CHLORIDE: 106 mmol/L (ref 96–111)
CO2 TOTAL: 30 mmol/L (ref 23–31)
CREATININE: 1.02 mg/dL (ref 0.60–1.05)
ESTIMATED GFR - FEMALE: 57 mL/min/BSA — ABNORMAL LOW (ref >=60)
GLUCOSE: 197 mg/dL — ABNORMAL HIGH (ref 70–99)
POTASSIUM: 4 mmol/L (ref 3.5–5.1)
PROTEIN TOTAL: 7 g/dL (ref 6.0–8.0)
SODIUM: 142 mmol/L (ref 136–145)

## 2022-03-17 LAB — HGA1C (HEMOGLOBIN A1C WITH EST AVG GLUCOSE)
ESTIMATED AVERAGE GLUCOSE: 174 mg/dL
HEMOGLOBIN A1C: 7.7 % — ABNORMAL HIGH (ref <=5.7)

## 2022-03-17 LAB — T3 (TRIIODOTHYRONINE), FREE, SERUM: T3 FREE: 2.1 pg/mL (ref 1.7–3.7)

## 2022-03-17 LAB — THYROID STIMULATING HORMONE (SENSITIVE TSH): TSH: 1.848 u[IU]/mL (ref 0.350–4.940)

## 2022-03-17 LAB — THYROXINE, FREE (FREE T4): THYROXINE (T4), FREE: 0.88 ng/dL (ref 0.70–1.48)

## 2022-03-17 NOTE — Nursing Note (Signed)
Department of Community Practice     Venipuncture performed in office on right arm antecubital vein, dry pressure dressing was applied to site and patient tolerated it well.  Specimen was centrifuged, aliquoted as needed and specimen was labeled and packaged for transport.    Dondra Prader, [place]  03/17/2022, 09:15

## 2022-03-18 NOTE — Progress Notes (Unsigned)
OUTPATIENT PROGRESS NOTE    Subjective:   Patient ID:  Linda [name] is a pleasant 77 y.o. female.    Chief Complaint: No chief complaint on file.      History of Present Illness:  DM: Fasting FS range ***   Nonfasting FS range ***   Hypoglycemia episodes  {YES/NO:19957}   Compliant with diabetic diet {YES/NO:19957}   Sores on feet {YES/NO:19957}  Diabetes Monitors  A1C: 7.7  A1C Date: 03/17/2022  Kidney Health:   Urine Microalbumin/Cr Ratio Ur Microalb/Cr Ratio date   eGFR --57 eGFR date--03/17/2022    Last Lipid Panel  (Last result in the past 2 years)        Cholesterol   HDL   LDL   Direct LDL   Triglycerides      03/17/22 0744 210   65   112  Comment: <100 mg/dL, Optimal  474-259 mg/dL, Near/Above Optimal  563-875 mg/dL, Borderline High  643-329 mg/dL, High  >=518 mg/dL, Very high     841            Retinal Exam Date: Not Found    Last Foot Exam: Not Found   HTN: HAs {YES/NO:19957}    Dizziness {YES/NO:19957}   Feeling like BP is too high or low {YES/NO:19957}   Compliant with taking meds {YES/NO:19957}   Home BP:  {Hypertension home bp:17448}   HLD: {hyperlipidemia 1:28262}     {Desc; diets:16563}   Is pt fasting today? {YES/NO:19957}   Hypothyroidism: Hair changes {YES/NO:19957}     Skin changes {YES/NO:19957}     Diarrhea or constipation  {YES/NO:19957}     Heat or cold intolerance  {YES/NO:19957}     Palpitations {YES/NO:19957}     Compliant with taking Synthroid {YES/NO:19957}     The history is provided by the patient.      Allergies:   No Known Allergies      Medications:   acetaminophen (TYLENOL) 500 mg Oral Tablet, Take 1 Tablet (500 mg total) by mouth Every 4 hours as needed for Pain  amLODIPine (NORVASC) 5 mg Oral Tablet, take 1 tablet by mouth once daily  Diclofenac Sodium 3 % Gel, Apply topically  glimepiride (AMARYL) 2 mg Oral Tablet, Take 2 Tablets (4 mg total) by mouth Every morning with breakfast  levETIRAcetam (KEPPRA) 500 mg Oral Tablet, Take 1 Tablet (500 mg total) by mouth Twice  daily  levothyroxine (SYNTHROID) 100 mcg Oral Tablet, Take 1 Tablet (100 mcg total) by mouth Every morning  pravastatin (PRAVACHOL) 80 mg Oral Tablet, take 1 tablet (80MG  TOTAL) by mouth once daily  predniSONE (DELTASONE) 5 mg Oral Tablet, Take 2 Tablets (10 mg total) by mouth Once a day  propranoloL (INDERAL) 10 mg Oral Tablet, Take 1 Tablet (10 mg total) by mouth Twice daily  SITagliptin-metformin (JANUMET) 50-1,000 mg Oral Tablet, Take 1 Tablet by mouth Twice daily with food  traZODone (DESYREL) 100 mg Oral Tablet, Take 1 Tablet (100 mg total) by mouth Every night  Valsartan-Hydrochlorothiazide 160-12.5 mg Oral Tablet, Take 1 Tablet by mouth Once a day    No facility-administered medications prior to visit.        Immunization History:     Immunization History   Administered Date(s) Administered    Influenza Vaccine, 6 month-adult 12/19/2016, 01/12/2020, 12/13/2020    PREVNAR 13 12/19/2016    Shingrix - Zoster Vaccine 12/12/2021         Past Medical History:     Past Medical History:   Diagnosis  Date    Chronic pain of right ankle 12/19/2016    Essential hypertension, benign 12/17/2016    History of CVA (cerebrovascular accident) 09/12/2020    Hypothyroidism, adult 12/17/2016    Mixed hyperlipidemia 12/17/2016    Obstructive sleep apnea 12/17/2016    Seizure (CMS HCC)     Stroke (CMS HCC)     Type 2 diabetes mellitus without complication, without long-term current use of insulin (CMS [place]) 12/17/2016             Past Surgical History:     Past Surgical History:   Procedure Laterality Date    HX APPENDECTOMY      HX BACK SURGERY      REPLACEMENT TOTAL KNEE      SHOULDER SURGERY      VEIN LIGATION               Family History:     Family Medical History:       Problem Relation (Age of Onset)    Leukemia Mother    Lymphoma Father                Social History:   Linda [name]  reports that she has never smoked. She has never used smokeless tobacco. She reports that she does not drink alcohol and does not use  drugs.          ROS:  As above in HPI, otherwise negative      Objective:   Vitals:  There were no vitals filed for this visit.       There is no height or weight on file to calculate BMI.      Constitutional: Alert, well developed, well nourished  HEENT:  Head: NC/AT    Eyes: Sclera anicteric, conjunctiva not injected    Ears: EAC normal, bilateral TMs clear    Nose: No discharge    Throat: MMM, posterior pharynx without erythema or exudate  Neck:   Supple with normal ROM, no cervical LAD, no thyromegaly, no JVD, no carotid bruits  Cardiovascular: RRR, normal S1/S2, no murmurs/rubs/gallops  Pulmonary:  CTAB, equal air entry, nonlabored, no wheezes/crackles/rhonchi  Abdomen:   NABS, NT/ND, soft, no HSM, no masses  Musculoskeletal:  No deformity, no injury, no edema  Neurological:   Alert, oriented x 3, no abnormal tone  Skin:     Warm, pink, dry, no rashes, no jaundice, no pallor, no cyanosis  Psychiatric:  Normal mood, affect, behavior, judgment, and thought content        Assessment & Plan:       ICD-10-CM    1. Essential hypertension, benign  I10       2. Mixed hyperlipidemia  E78.2       3. Obstructive sleep apnea  G47.33       4. Cerebral amyloid angiopathy (CMS HCC)   E85.4     I68.0       5. Convulsions, unspecified convulsion type (CMS HCC)  R56.9       6. History of CVA (cerebrovascular accident)  Z86.73       7. Lumbar radiculopathy  M54.16       8. Polymyalgia rheumatica (CMS HCC)  M35.3       9. Hypothyroidism, adult  E03.9       10. Type 2 diabetes mellitus without complication, without long-term current use of insulin (CMS HCC)  E11.9       11. Chronic pain of right ankle  M25.571  G89.29       12. Loss of balance  R26.89           No orders of the defined types were placed in this encounter.            {This patient MAY be non-adherent to their STATIN medication. (If medication adherence given choose from SmartList. This will disappear if unselected upon signing note):46834}      {This patient MAY  be non-adherent to their non-insulin DIABETES medication. (If medication adherence given choose from SmartList. This will disappear if unselected upon signing note):46836}          Health Maintenance   Topic Date Due    Diabetic Retinal Exam  Never done    Osteoporosis screening  Never done    Hepatitis C screening  Never done    Covid-19 Vaccine (1) Never done    Diabetic Kidney Health Microalb/Cr Ratio  Never done    Adult Tdap-Td (1 - Tdap) Never done    Pneumococcal Vaccination, Age 11+ (2 - PPSV23 or PCV20) 02/13/2017    Influenza Vaccine (1) 11/08/2021    Shingles Vaccine (2 of 2) 02/06/2022    Medicare Annual Wellness Visit - Calendar Year Insurers  03/10/2022    Depression Screening  08/31/2022    Diabetic A1C  09/15/2022    Diabetic Kidney Health eGFR  03/18/2023    Colonoscopy  07/02/2025    Meningococcal Vaccine  Aged Out   We did review patient diabetes status and need for continued diet, exercise and regular follow up with labs and A1c. We did rec yearly ophthalmology exams and also regular foot exam and did review foot care. Patient will continue to monitor blood glucose and call with any issues or concerns.     No follow-ups on file.    Algis Downs [employer] DO

## 2022-03-19 ENCOUNTER — Telehealth (INDEPENDENT_AMBULATORY_CARE_PROVIDER_SITE_OTHER): Payer: Self-pay | Admitting: Family Medicine

## 2022-03-19 ENCOUNTER — Encounter (INDEPENDENT_AMBULATORY_CARE_PROVIDER_SITE_OTHER): Payer: Self-pay | Admitting: Family Medicine

## 2022-03-19 ENCOUNTER — Ambulatory Visit (INDEPENDENT_AMBULATORY_CARE_PROVIDER_SITE_OTHER): Payer: Medicare PPO | Admitting: Family Medicine

## 2022-03-19 ENCOUNTER — Other Ambulatory Visit: Payer: Self-pay

## 2022-03-19 VITALS — BP 116/62 | HR 61 | Temp 96.9°F | Wt 217.3 lb

## 2022-03-19 DIAGNOSIS — M353 Polymyalgia rheumatica: Secondary | ICD-10-CM

## 2022-03-19 DIAGNOSIS — E039 Hypothyroidism, unspecified: Secondary | ICD-10-CM

## 2022-03-19 DIAGNOSIS — R569 Unspecified convulsions: Secondary | ICD-10-CM

## 2022-03-19 DIAGNOSIS — I680 Cerebral amyloid angiopathy: Secondary | ICD-10-CM

## 2022-03-19 DIAGNOSIS — R2689 Other abnormalities of gait and mobility: Secondary | ICD-10-CM

## 2022-03-19 DIAGNOSIS — G4733 Obstructive sleep apnea (adult) (pediatric): Secondary | ICD-10-CM

## 2022-03-19 DIAGNOSIS — E119 Type 2 diabetes mellitus without complications: Secondary | ICD-10-CM

## 2022-03-19 DIAGNOSIS — I10 Essential (primary) hypertension: Secondary | ICD-10-CM

## 2022-03-19 DIAGNOSIS — E854 Organ-limited amyloidosis: Secondary | ICD-10-CM

## 2022-03-19 DIAGNOSIS — Z8673 Personal history of transient ischemic attack (TIA), and cerebral infarction without residual deficits: Secondary | ICD-10-CM

## 2022-03-19 DIAGNOSIS — G8929 Other chronic pain: Secondary | ICD-10-CM

## 2022-03-19 DIAGNOSIS — E782 Mixed hyperlipidemia: Secondary | ICD-10-CM

## 2022-03-19 DIAGNOSIS — M25571 Pain in right ankle and joints of right foot: Secondary | ICD-10-CM

## 2022-03-19 DIAGNOSIS — M5416 Radiculopathy, lumbar region: Secondary | ICD-10-CM

## 2022-03-19 MED ORDER — MOUNJARO 2.5 MG/0.5 ML SUBCUTANEOUS PEN INJECTOR
2.5000 mg | PEN_INJECTOR | SUBCUTANEOUS | 5 refills | Status: DC
Start: 2022-03-19 — End: 2022-04-14

## 2022-03-19 NOTE — Telephone Encounter (Signed)
Dr Means advised patient of lab results  Dondra Prader, [place]  03/19/2022 15:00

## 2022-03-19 NOTE — Telephone Encounter (Signed)
-----   Message from [employer] DO sent at 03/18/2022  7:57 PM EST -----  a1c did improve. cholesterol still elevated and will discuss at her visit.

## 2022-04-14 ENCOUNTER — Ambulatory Visit (INDEPENDENT_AMBULATORY_CARE_PROVIDER_SITE_OTHER): Payer: Self-pay | Admitting: Family Medicine

## 2022-04-14 MED ORDER — MOUNJARO 5 MG/0.5 ML SUBCUTANEOUS PEN INJECTOR
5.0000 mg | PEN_INJECTOR | SUBCUTANEOUS | 3 refills | Status: DC
Start: 2022-04-14 — End: 2022-05-12

## 2022-04-14 NOTE — Telephone Encounter (Unsigned)
Can you bump this up for her?  Linda [name], [place]  04/14/2022 14:42

## 2022-04-14 NOTE — Telephone Encounter (Signed)
Regarding: rx  ----- Message from Trenton Founds sent at 04/14/2022  2:04 PM EST -----  Harrietta Guardian., DO    Rx refill...... dose needs bumped up to 5    tirzepatide (MOUNJARO) 2.5 mg/0.5 mL Subcutaneous Pen Injector    Preferred Pharmacy     [facility] [place] [facility] [place]    [place] [address]    Phone: [phone] Fax: [phone]    Hours: Not open 24 hours

## 2022-04-14 NOTE — Telephone Encounter (Unsigned)
sent

## 2022-04-15 NOTE — Telephone Encounter (Signed)
Advised patient of medication send to [place], [place]  04/15/2022 13:32

## 2022-04-28 ENCOUNTER — Other Ambulatory Visit (INDEPENDENT_AMBULATORY_CARE_PROVIDER_SITE_OTHER): Payer: Self-pay | Admitting: Family Medicine

## 2022-04-28 MED ORDER — PREDNISONE 5 MG TABLET
10.0000 mg | ORAL_TABLET | Freq: Every day | ORAL | 3 refills | Status: DC
Start: 2022-04-28 — End: 2023-07-23

## 2022-04-28 MED ORDER — LEVOTHYROXINE 100 MCG TABLET
100.0000 ug | ORAL_TABLET | Freq: Every morning | ORAL | 3 refills | Status: DC
Start: 2022-04-28 — End: 2023-01-06

## 2022-04-28 NOTE — Telephone Encounter (Signed)
Regarding: rx  ----- Message from Lilia Argue sent at 04/28/2022 11:31 AM EST -----  Harrietta Guardian., DO    Pharm told pt hasn't heard from dr yet about refills    levothyroxine (SYNTHROID) 100 mcg Oral Tablet    predniSONE (DELTASONE) 5 mg Oral Tablet    Preferred Pharmacy     [facility] [place] [facility] [place]    [place] [address]    Phone: [phone] Fax: [phone]    Hours: Not open 24 hours

## 2022-05-12 ENCOUNTER — Ambulatory Visit (INDEPENDENT_AMBULATORY_CARE_PROVIDER_SITE_OTHER): Payer: Self-pay | Admitting: Family Medicine

## 2022-05-12 MED ORDER — MOUNJARO 7.5 MG/0.5 ML SUBCUTANEOUS PEN INJECTOR
7.5000 mg | PEN_INJECTOR | SUBCUTANEOUS | 4 refills | Status: DC
Start: 2022-05-12 — End: 2022-05-30

## 2022-05-12 NOTE — Telephone Encounter (Signed)
Were you going to increase this?  Dondra Prader, [place]  05/12/2022 09:05

## 2022-05-12 NOTE — Telephone Encounter (Signed)
Regarding: increase dosage?  ----- Message from [name] sent at 05/12/2022  8:52 AM EST -----  Linda [name]., DO    Refills.  Pt was under the impression that this would be increased to 7.5 mg    tirzepatide (MOUNJARO) 5 mg/0.5 mL Subcutaneous Pen Injector    Preferred Pharmacy     [facility] [place] [facility] [place]    [place] [address]    Phone: [phone] Fax: [phone]    Hours: Not open 24 hours

## 2022-05-12 NOTE — Telephone Encounter (Signed)
i just sent in the 7.'5mg'$ 

## 2022-05-26 ENCOUNTER — Ambulatory Visit (INDEPENDENT_AMBULATORY_CARE_PROVIDER_SITE_OTHER): Payer: Self-pay | Admitting: Family Medicine

## 2022-05-26 NOTE — Telephone Encounter (Signed)
What would like to do  [employer], [place]  05/26/2022 15:12

## 2022-05-26 NOTE — Telephone Encounter (Signed)
please call and see if any strengths available

## 2022-05-26 NOTE — Telephone Encounter (Signed)
Regarding: rx - [facility] are out  ----- Message from Lilia Argue sent at 05/26/2022  3:06 PM EDT -----  Harrietta Guardian., DO    Pt said [facility] don't have rx    What do you want her to do?    Please call to advise    tirzepatide (MOUNJARO) 7.5 mg/0.5 mL Subcutaneous Pen Injector

## 2022-05-27 NOTE — Telephone Encounter (Signed)
They only thing [facility] has in stock is the Zepbound 2.5 mg.  Dondra Prader, [place]  05/27/2022 14:51

## 2022-05-30 ENCOUNTER — Ambulatory Visit (INDEPENDENT_AMBULATORY_CARE_PROVIDER_SITE_OTHER): Payer: Self-pay | Admitting: Family Medicine

## 2022-05-30 ENCOUNTER — Encounter (INDEPENDENT_AMBULATORY_CARE_PROVIDER_SITE_OTHER): Payer: Self-pay | Admitting: Family Medicine

## 2022-05-30 MED ORDER — MOUNJARO 7.5 MG/0.5 ML SUBCUTANEOUS PEN INJECTOR
7.5000 mg | PEN_INJECTOR | SUBCUTANEOUS | 4 refills | Status: DC
Start: 2022-05-30 — End: 2022-06-18

## 2022-05-30 NOTE — Telephone Encounter (Signed)
i sent it in for her please call her

## 2022-05-30 NOTE — Telephone Encounter (Signed)
Advised patient that rx is at [facility], [place]  05/30/2022 15:09

## 2022-05-30 NOTE — Telephone Encounter (Signed)
[facility] [place] has it stock 3 boxes in [place], [place]  05/30/2022 14:58

## 2022-05-30 NOTE — Telephone Encounter (Signed)
Regarding: RX  ----- Message from Louis Meckel sent at 05/29/2022  4:24 PM EDT -----  Harrietta Guardian., DO    Pt calling about her tirzepatide [facility]) 7.5 mg/0.5 mL Subcutaneous Pen Injector.    Still unable to find and needs to know what to do about her diabetes.    Thank you, Vania Rea

## 2022-06-16 ENCOUNTER — Ambulatory Visit: Payer: Medicare PPO | Attending: Family Medicine | Admitting: Family Medicine

## 2022-06-16 ENCOUNTER — Ambulatory Visit (INDEPENDENT_AMBULATORY_CARE_PROVIDER_SITE_OTHER): Payer: Medicare PPO

## 2022-06-16 ENCOUNTER — Other Ambulatory Visit: Payer: Self-pay

## 2022-06-16 DIAGNOSIS — I10 Essential (primary) hypertension: Secondary | ICD-10-CM | POA: Insufficient documentation

## 2022-06-16 DIAGNOSIS — E782 Mixed hyperlipidemia: Secondary | ICD-10-CM

## 2022-06-16 DIAGNOSIS — E119 Type 2 diabetes mellitus without complications: Secondary | ICD-10-CM

## 2022-06-16 DIAGNOSIS — E039 Hypothyroidism, unspecified: Secondary | ICD-10-CM

## 2022-06-16 LAB — COMPREHENSIVE METABOLIC PNL, FASTING
ALBUMIN: 4 g/dL (ref 3.4–4.8)
ALKALINE PHOSPHATASE: 43 U/L — ABNORMAL LOW (ref 55–145)
ALT (SGPT): 14 U/L (ref 8–22)
ANION GAP: 9 mmol/L (ref 4–13)
AST (SGOT): 15 U/L (ref 8–45)
BILIRUBIN TOTAL: 0.7 mg/dL (ref 0.3–1.3)
BUN/CREA RATIO: 16 (ref 6–22)
BUN: 16 mg/dL (ref 8–25)
CALCIUM: 9.7 mg/dL (ref 8.6–10.3)
CHLORIDE: 105 mmol/L (ref 96–111)
CO2 TOTAL: 26 mmol/L (ref 23–31)
CREATININE: 0.98 mg/dL (ref 0.60–1.05)
ESTIMATED GFR - FEMALE: 60 mL/min/BSA (ref >=60)
GLUCOSE: 180 mg/dL — ABNORMAL HIGH (ref 70–99)
POTASSIUM: 3.7 mmol/L (ref 3.5–5.1)
PROTEIN TOTAL: 7.2 g/dL (ref 6.0–8.0)
SODIUM: 140 mmol/L (ref 136–145)

## 2022-06-16 LAB — CBC WITH DIFF
BASOPHIL #: <0.1 10*3/uL (ref <=0.20)
BASOPHIL %: 1 %
EOSINOPHIL #: <0.1 10*3/uL (ref <=0.50)
EOSINOPHIL %: 0 %
HCT: 38.5 % (ref 34.8–46.0)
HGB: 12.9 g/dL (ref 11.5–16.0)
IMMATURE GRANULOCYTE #: <0.1 10*3/uL (ref <0.10)
IMMATURE GRANULOCYTE %: 0 % (ref 0.0–1.0)
LYMPHOCYTE #: 2.15 10*3/uL (ref 1.00–4.80)
LYMPHOCYTE %: 31 %
MCH: 31.9 pg (ref 26.0–32.0)
MCHC: 33.5 g/dL (ref 31.0–35.5)
MCV: 95.3 fL (ref 78.0–100.0)
MONOCYTE #: 0.6 10*3/uL (ref 0.20–1.10)
MONOCYTE %: 9 %
MPV: 9.6 fL (ref 8.7–12.5)
NEUTROPHIL #: 4.18 10*3/uL (ref 1.50–7.70)
NEUTROPHIL %: 59 %
PLATELETS: 254 10*3/uL (ref 150–400)
RBC: 4.04 10*6/uL (ref 3.85–5.22)
RDW-CV: 13.2 % (ref 11.5–15.5)
WBC: 7 10*3/uL (ref 3.7–11.0)

## 2022-06-16 LAB — LIPID PANEL
CHOL/HDL RATIO: 4.1
CHOLESTEROL: 223 mg/dL — ABNORMAL HIGH (ref 100–200)
HDL CHOL: 54 mg/dL (ref >=50)
LDL CALC: 135 mg/dL — ABNORMAL HIGH (ref <100)
NON-HDL: 169 mg/dL (ref <=190)
TRIGLYCERIDES: 190 mg/dL — ABNORMAL HIGH (ref <150)
VLDL CALC: 34 mg/dL — ABNORMAL HIGH (ref <30)

## 2022-06-16 LAB — THYROID STIMULATING HORMONE (SENSITIVE TSH): TSH: 1.756 u[IU]/mL (ref 0.350–4.940)

## 2022-06-16 LAB — HGA1C (HEMOGLOBIN A1C WITH EST AVG GLUCOSE)
ESTIMATED AVERAGE GLUCOSE: 209 mg/dL
HEMOGLOBIN A1C: 8.9 % — ABNORMAL HIGH (ref <=5.7)

## 2022-06-16 NOTE — Nursing Note (Signed)
Venipuncture times 1 to right ac.  Labs obtained.  Patient tolerated well.

## 2022-06-17 NOTE — Progress Notes (Unsigned)
OUTPATIENT PROGRESS NOTE    Subjective:   Patient ID:  Linda [name] is a pleasant 77 y.o. female.    Chief Complaint: No chief complaint on file.      History of Present Illness:  DM: Fasting FS range ***   Nonfasting FS range ***   Hypoglycemia episodes  {YES/NO:19957}   Compliant with diabetic diet {YES/NO:19957}   Sores on feet {YES/NO:19957}  Diabetes Monitors  A1C: 8.9  A1C Date: 06/16/2022  Kidney Health:   Urine Microalbumin/Cr Ratio Ur Microalb/Cr Ratio date   eGFR --60 eGFR date--06/16/2022    Last Lipid Panel  (Last result in the past 2 years)        Cholesterol   HDL   LDL   Direct LDL   Triglycerides      06/16/22 1004 223   54   135  Comment: <100 mg/dL, Optimal  [id number] mg/dL, Near/Above Optimal  [phone] mg/dL, Borderline High  [phone] mg/dL, High  >=782>=190 mg/dL, Very high     [id number]            Retinal Exam Date: Not Found    Last Foot Exam: Not Found   HTN: HAs {YES/NO:19957}    Dizziness {YES/NO:19957}   Feeling like BP is too high or low {YES/NO:19957}   Compliant with taking meds {YES/NO:19957}   Home BP:  {Hypertension home bp:17448}   HLD: {hyperlipidemia 1:28262}     {Desc; diets:16563}   Is pt fasting today? {YES/NO:19957}   Hypothyroidism: Hair changes {YES/NO:19957}     Skin changes {YES/NO:19957}     Diarrhea or constipation  {YES/NO:19957}     Heat or cold intolerance  {YES/NO:19957}     Palpitations {YES/NO:19957}     Compliant with taking Synthroid {YES/NO:19957}     The history is provided by the patient.      Allergies:   No Known Allergies      Medications:   acetaminophen (TYLENOL) 500 mg Oral Tablet, Take 1 Tablet (500 mg total) by mouth Every 4 hours as needed for Pain  amLODIPine (NORVASC) 5 mg Oral Tablet, take 1 tablet by mouth once daily  Diclofenac Sodium 3 % Gel, Apply topically  glimepiride (AMARYL) 2 mg Oral Tablet, Take 2 Tablets (4 mg total) by mouth Every morning with breakfast  levETIRAcetam (KEPPRA) 500 mg Oral Tablet, Take 1 Tablet (500 mg total) by mouth Twice  daily  levothyroxine (SYNTHROID) 100 mcg Oral Tablet, Take 1 Tablet (100 mcg total) by mouth Every morning  pravastatin (PRAVACHOL) 80 mg Oral Tablet, take 1 tablet (80MG  TOTAL) by mouth once daily  predniSONE (DELTASONE) 5 mg Oral Tablet, Take 2 Tablets (10 mg total) by mouth Once a day  propranoloL (INDERAL) 10 mg Oral Tablet, Take 1 Tablet (10 mg total) by mouth Twice daily  tirzepatide (MOUNJARO) 7.5 mg/0.5 mL Subcutaneous Pen Injector, Inject 0.5 mL (7.5 mg total) under the skin Every 7 days  traZODone (DESYREL) 100 mg Oral Tablet, Take 1 Tablet (100 mg total) by mouth Every night  Valsartan-Hydrochlorothiazide 160-12.5 mg Oral Tablet, Take 1 Tablet by mouth Once a day    No facility-administered medications prior to visit.        Immunization History:     Immunization History   Administered Date(s) Administered    Covid-19 Vaccine,Moderna,12 Years+ 04/06/2019, 05/06/2019, 01/04/2020    Influenza Vaccine, 6 month-adult 12/19/2016, 01/12/2020, 12/13/2020    PREVNAR 13 12/19/2016    Shingrix - Zoster Vaccine 12/12/2021  Past Medical History:     Past Medical History:   Diagnosis Date    Chronic pain of right ankle 12/19/2016    Essential hypertension, benign 12/17/2016    History of CVA (cerebrovascular accident) 09/12/2020    Hypothyroidism, adult 12/17/2016    Mixed hyperlipidemia 12/17/2016    Obstructive sleep apnea 12/17/2016    Seizure (CMS HCC)     Stroke (CMS HCC)     Type 2 diabetes mellitus without complication, without long-term current use of insulin (CMS HCC) 12/17/2016             Past Surgical History:     Past Surgical History:   Procedure Laterality Date    HX APPENDECTOMY      HX BACK SURGERY      REPLACEMENT TOTAL KNEE      SHOULDER SURGERY      VEIN LIGATION               Family History:     Family Medical History:       Problem Relation (Age of Onset)    Leukemia Mother    Lymphoma Father                Social History:   Zyriah Aronov  reports that she has never smoked. She has never used  smokeless tobacco. She reports that she does not drink alcohol and does not use drugs.          ROS:  As above in HPI, otherwise negative      Objective:   Vitals:  There were no vitals filed for this visit.       There is no height or weight on file to calculate BMI.      Constitutional: Alert, well developed, well nourished  HEENT:  Head: NC/AT    Eyes: Sclera anicteric, conjunctiva not injected    Ears: EAC normal, bilateral TMs clear    Nose: No discharge    Throat: MMM, posterior pharynx without erythema or exudate  Neck:   Supple with normal ROM, no cervical LAD, no thyromegaly, no JVD, no carotid bruits  Cardiovascular: RRR, normal S1/S2, no murmurs/rubs/gallops  Pulmonary:  CTAB, equal air entry, nonlabored, no wheezes/crackles/rhonchi  Abdomen:   NABS, NT/ND, soft, no HSM, no masses  Musculoskeletal:  No deformity, no injury, no edema  Neurological:   Alert, oriented x 3, no abnormal tone  Skin:     Warm, pink, dry, no rashes, no jaundice, no pallor, no cyanosis  Psychiatric:  Normal mood, affect, behavior, judgment, and thought content        Assessment & Plan:       ICD-10-CM    1. Essential hypertension, benign  I10       2. Mixed hyperlipidemia  E78.2       3. Obstructive sleep apnea  G47.33       4. 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 Convulsions, unspecified convulsion type (CMS HCC)  R56.9       6. History of CVA (cerebrovascular accident)  Z86.73       7. Lumbar radiculopathy  M54.16       8. Polymyalgia rheumatica (CMS HCC)  M35.3       9. Hypothyroidism, adult  E03.9       10. Type 2 diabetes mellitus without complication, without long-term current use of insulin (CMS HCC)  E11.9  No orders of the defined types were placed in this encounter.                {This patient MAY be non-adherent to their non-insulin DIABETES medication. (If medication adherence given choose from SmartList. This will disappear if unselected upon signing  note):46836}        Health Maintenance   Topic Date Due    Diabetic Retinal Exam  Never done    Osteoporosis screening  Never done    Hepatitis C screening  Never done    Diabetic Kidney Health Microalb/Cr Ratio  Never done    Pneumococcal Vaccination, Age 62+ (2 of 2 - PPSV23 or PCV20) 02/13/2017    Covid-19 Vaccine (4 - 2023-24 season) 11/08/2021    Medicare Annual Wellness Visit - Calendar Year Insurers  03/10/2022    Diabetic A1C  09/15/2022    Influenza Vaccine (Season Ended) 11/09/2022    Depression Screening  03/20/2023    Diabetic Kidney Health eGFR  06/16/2023    Adult Tdap-Td (2 - Td or Tdap) 05/16/2030    Shingles Vaccine  Completed    Meningococcal Vaccine  Aged Out       No follow-ups on file.    Algis Downs [employer] DO

## 2022-06-18 ENCOUNTER — Other Ambulatory Visit: Payer: Self-pay

## 2022-06-18 ENCOUNTER — Encounter (INDEPENDENT_AMBULATORY_CARE_PROVIDER_SITE_OTHER): Payer: Self-pay | Admitting: Family Medicine

## 2022-06-18 ENCOUNTER — Telehealth (INDEPENDENT_AMBULATORY_CARE_PROVIDER_SITE_OTHER): Payer: Self-pay | Admitting: Family Medicine

## 2022-06-18 ENCOUNTER — Ambulatory Visit (INDEPENDENT_AMBULATORY_CARE_PROVIDER_SITE_OTHER): Payer: Medicare PPO | Admitting: Family Medicine

## 2022-06-18 VITALS — BP 110/60 | HR 72 | Temp 96.9°F | Ht 66.0 in | Wt 210.7 lb

## 2022-06-18 DIAGNOSIS — G4733 Obstructive sleep apnea (adult) (pediatric): Secondary | ICD-10-CM

## 2022-06-18 DIAGNOSIS — R569 Unspecified convulsions: Secondary | ICD-10-CM

## 2022-06-18 DIAGNOSIS — Z0000 Encounter for general adult medical examination without abnormal findings: Secondary | ICD-10-CM

## 2022-06-18 DIAGNOSIS — I680 Cerebral amyloid angiopathy: Secondary | ICD-10-CM

## 2022-06-18 DIAGNOSIS — E039 Hypothyroidism, unspecified: Secondary | ICD-10-CM

## 2022-06-18 DIAGNOSIS — I10 Essential (primary) hypertension: Secondary | ICD-10-CM

## 2022-06-18 DIAGNOSIS — M5416 Radiculopathy, lumbar region: Secondary | ICD-10-CM

## 2022-06-18 DIAGNOSIS — Z7985 Long-term (current) use of injectable non-insulin antidiabetic drugs: Secondary | ICD-10-CM

## 2022-06-18 DIAGNOSIS — M353 Polymyalgia rheumatica: Secondary | ICD-10-CM

## 2022-06-18 DIAGNOSIS — Z8673 Personal history of transient ischemic attack (TIA), and cerebral infarction without residual deficits: Secondary | ICD-10-CM

## 2022-06-18 DIAGNOSIS — E782 Mixed hyperlipidemia: Secondary | ICD-10-CM

## 2022-06-18 DIAGNOSIS — E119 Type 2 diabetes mellitus without complications: Secondary | ICD-10-CM

## 2022-06-18 MED ORDER — MOUNJARO 10 MG/0.5 ML SUBCUTANEOUS PEN INJECTOR
10.0000 mg | PEN_INJECTOR | SUBCUTANEOUS | 3 refills | Status: DC
Start: 2022-06-18 — End: 2022-07-07

## 2022-06-18 NOTE — Telephone Encounter (Signed)
Advised patient of lab results  Linda [name], [place]  06/18/2022 13:24

## 2022-06-18 NOTE — Nursing Note (Signed)
06/18/22 1300   Medicare Wellness Assessment   Medicare initial or wellness physical in the last year? No   Advance Directives   Does patient have a living will or MPOA No   Advance directive information given to the patient today? Yes   Activities of Daily Living   Do you need help with dressing, bathing, or walking? No   Do you need help with shopping, housekeeping, medications, or finances? No   Do you have rugs in hallways, broken steps, or poor lighting? No   Do you have grab bars in your bathroom, non-slip strips in your tub, and hand rails on your stairs? Yes   Cognitive Function Screen   What is you age? 1   What is the time to the nearest hour? 1   What is the year? 1   What is the name of this clinic? 1   Can the patient recognize two persons (the doctor, the nurse, home help, etc.)? 1   What is the date of your birth? (day and month sufficient)  1   In what year did World War II end? 1   Who is the current president of the [place]? 1   Count from 20 down to 1? 1   What address did I give you earlier? 1   Total Score 10   Depression Screen   Little interest or pleasure in doing things. 0   Feeling down, depressed, or hopeless 0   PHQ 2 Total 0   Pain Score   Pain Score Four   Substance Use Screening   In Past 12 MONTHS, how often have you used any tobacco product (for example, cigarettes, e-cigarettes, cigars, pipes, or smokeless tobacco)? Never   In the PAST 12 MONTHS, how often have you had 5 (men)/4 (women) or more drinks containing alcohol in one day? Never   In the PAST 12 months, how often have you used any prescription medications just for the feeling, more than prescribed, or that were not prescribed for you? Prescriptions may include: opioids, benzodiazepines, medications for ADHD Never   In the PAST 12 MONTHS, how often have you used any drugs, including marijuana, cocaine or crack, heroin, methamphetamine, hallucinogens, ecstasy/MDMA? Never   Fall Risk Assessment   Do you feel unsteady  when standing or walking? No   Do you worry about falling? No   Have you fallen in the past year? No   Urinary Incontinence Screen   Do you ever leak urine when you don't want to? No

## 2022-06-18 NOTE — Patient Instructions (Signed)
Medicare Preventive Services  Medicare coverage information Recommendation for YOU   Heart Disease and Diabetes   Lipid profile Every 5 years or more often if at risk for cardiovascular disease     Lab Results   Component Value Date    CHOLESTEROL 223 (H) 06/16/2022    HDLCHOL 54 06/16/2022    LDLCHOL 135 (H) 06/16/2022    LDLCHOLDIR 105 01/09/2020    TRIG 190 (H) 06/16/2022         Diabetes Screening    Yearly for those at risk for diabetes, 2 tests per year for those with prediabetes Last Glucose: 140    Diabetes Self Management Training or Medical Nutrition Therapy  For those with diabetes, up to 10 hrs initial training within a year, subsequent years up to 2 hrs of follow up training Optional for those with diabetes     Medical Nutrition Therapy  Three hours of one-on-one counseling in first year, two hours in subsequent years Optional for those with diabetes, kidney disease   Intensive Behavioral Therapy for Obesity  Face-to-face counseling, first month every week, month 2-6 every other week, month 7-12 every month if continued progress is documented Optional for those with Body Mass Index 30 or higher  Your Body mass index is 34.01 kg/m.   Tobacco Cessation (Quitting) Counseling   Covers up to 8 smoking and tobacco-use cessation counseling sessions in a 21-month period.    Optional for those that use tobacco   Cancer Screening Last Completion Date   Colorectal screening   For anyone age 22 to 20 or any age if high risk:  Screening Colonoscopy every 10 yrs if low risk,  more frequent if higher risk  OR  Cologuard Stool DNA test once every 3 years OR  Fecal Occult Blood Testing yearly OR  Flexible  Sigmoidoscopy  every 5 yr OR  CT Colonography every 5 yrs      See below for due date if applicable.   Screening Pap Test   Recommended every 3 years for all women age 76 to 19, or every five years if combined with HPV test (routine screening not needed after total hysterectomy).  Medicare covers every 2 years or  yearly if high risk.  Screening Pelvic Exam   Medicare covers every 2 years, yearly if high risk or childbearing age with abnormal Pap in last 3 yrs.     See below for due date if applicable.   Screening Mammogram   Recommended every 2 years for women age 12 to 68, or more frequent if you have a higher risk. Selectively recommended for women between 40-49 based on shared decisions about risk. Covered by Medicare up to every year for women age 51 or older   See below for due date if applicable.         Lung Cancer Screening  Annual low dose computed tomography (LDCT scan) is recommended for those age 63-80 who smoked 20 pack-years and are current smokers or quit smoking within past 15 years, after counseling by your doctor or nurse clinician about the possible benefits or harms.     See below for due date if applicable.   Vaccinations   Respiratory syncytial virus (RSV)  Age 67 years or older: Based on shared clinical decision-making with your provider.  Pneumococcal Vaccine  Recommended routinely age 30+ with one or two separate vaccines based on your risk. Recommended before age 102 if medical conditions with increased risk  Seasonal Influenza Vaccine  Once every flu season   Hepatitis B Vaccine  3 doses if risk (including anyone with diabetes or liver disease)  Shingles Vaccine  Two doses at age 33 or older  Diphtheria Tetanus Pertussis Vaccine  ONCE as adult, booster every 10 years     Immunization History   Administered Date(s) Administered    Covid-19 Vaccine,Moderna,12 Years+ 04/06/2019, 05/06/2019, 01/04/2020    Influenza Vaccine, 6 month-adult 12/19/2016, 01/12/2020, 12/13/2020    PREVNAR 13 12/19/2016    Shingrix - Zoster Vaccine 12/12/2021     Shingles vaccine and Diphtheria Tetanus Pertussis vaccines are available at pharmacies or local health department without a prescription.   Other Preventative Screening  Last Completion Date   Bone Densitometry   Screening: All females ages 75 and older every 10 years  if initial screening normal. Postmenopausal women ages 68-64 need screening with one or more risk factor: previous fracture, parental hip fracture, current smoker, low body weight, excessive alcohol use, Rheumatoid Arthritis   For women with diagnosed Osteoporosis, follow up is recommended every 2 years or a frequency recommended by your provider.       See below for due date if applicable.     Glaucoma Screening   Yearly if in high risk group such as diabetes, family history, African American age 66+ or Hispanic American age 18+   See your eye care provider for screening.   Hepatitis C Screening   Recommended  for those born between ages 18-79 years.     See below for due date if applicable.     HIV Testing  Recommended routinely at least ONCE, covered every year for age 23 to 52 regardless of risk, and every year for age over 49 who ask for the test or higher risk. Yearly or up to 3 times in pregnancy         See below for due date if applicable.   Abdominal Aortic Aneurysm Screening Ultrasound   Once with a family history of abdominal aortic aneurysms OR a female between 83-75 and have smoked at least 100 cigarettes in your lifetime.         See below for due date if applicable.       Your Personalized Schedule for Preventive Tests     Health Maintenance: Pending and Last Completed         Date Due Completion Date    Diabetic Retinal Exam Never done ---    Osteoporosis screening Never done ---    Hepatitis C screening Never done ---    Diabetic Kidney Health Microalb/Cr Ratio Never done ---    Pneumococcal Vaccination, Age 59+ (2 of 2 - PPSV23 or PCV20) 02/13/2017 12/19/2016    Covid-19 Vaccine (4 - 2023-24 season) 11/08/2021 01/04/2020    Diabetic A1C 09/15/2022 06/16/2022    Influenza Vaccine (Season Ended) 11/09/2022 12/13/2020    Diabetic Kidney Health eGFR 06/16/2023 06/16/2022    Depression Screening 06/18/2023 06/18/2022    Adult Tdap-Td (2 - Td or Tdap) 05/16/2030 05/15/2020                  For Information on  Advanced Directives for Health Care:  [place]:  [email or web address]  [place], [place], [place], [place] General Information: [email or web address]

## 2022-06-18 NOTE — Telephone Encounter (Signed)
-----   Message from [employer] DO sent at 06/16/2022  9:08 PM EDT -----  a1c up to 8.9% , keep follow up to review, cholesterol increased also

## 2022-07-07 ENCOUNTER — Other Ambulatory Visit (INDEPENDENT_AMBULATORY_CARE_PROVIDER_SITE_OTHER): Payer: Self-pay | Admitting: Family Medicine

## 2022-07-07 ENCOUNTER — Encounter (INDEPENDENT_AMBULATORY_CARE_PROVIDER_SITE_OTHER): Payer: Self-pay | Admitting: Family Medicine

## 2022-07-07 MED ORDER — MOUNJARO 10 MG/0.5 ML SUBCUTANEOUS PEN INJECTOR
10.0000 mg | PEN_INJECTOR | SUBCUTANEOUS | 3 refills | Status: DC
Start: 2022-07-07 — End: 2022-11-11

## 2022-09-22 ENCOUNTER — Other Ambulatory Visit: Payer: Self-pay

## 2022-09-22 ENCOUNTER — Other Ambulatory Visit: Payer: Medicare PPO | Attending: Family Medicine | Admitting: Family Medicine

## 2022-09-22 ENCOUNTER — Ambulatory Visit (INDEPENDENT_AMBULATORY_CARE_PROVIDER_SITE_OTHER): Payer: Self-pay | Admitting: Family Medicine

## 2022-09-22 ENCOUNTER — Ambulatory Visit (INDEPENDENT_AMBULATORY_CARE_PROVIDER_SITE_OTHER): Payer: Medicare PPO

## 2022-09-22 DIAGNOSIS — E039 Hypothyroidism, unspecified: Secondary | ICD-10-CM | POA: Insufficient documentation

## 2022-09-22 DIAGNOSIS — E119 Type 2 diabetes mellitus without complications: Secondary | ICD-10-CM

## 2022-09-22 DIAGNOSIS — E782 Mixed hyperlipidemia: Secondary | ICD-10-CM | POA: Insufficient documentation

## 2022-09-22 DIAGNOSIS — Z1159 Encounter for screening for other viral diseases: Secondary | ICD-10-CM

## 2022-09-22 LAB — LIPID PANEL
CHOL/HDL RATIO: 4.6
CHOLESTEROL: 185 mg/dL (ref 100–200)
HDL CHOL: 40 mg/dL — ABNORMAL LOW (ref >=50)
LDL CALC: 105 mg/dL — ABNORMAL HIGH (ref <100)
NON-HDL: 145 mg/dL (ref <=190)
TRIGLYCERIDES: 230 mg/dL — ABNORMAL HIGH (ref <150)
VLDL CALC: 38 mg/dL — ABNORMAL HIGH (ref <30)

## 2022-09-22 LAB — COMPREHENSIVE METABOLIC PNL, FASTING
ALBUMIN: 3.8 g/dL (ref 3.4–4.8)
ALKALINE PHOSPHATASE: 44 U/L — ABNORMAL LOW (ref 55–145)
ALT (SGPT): 9 U/L (ref 8–22)
ANION GAP: 11 mmol/L (ref 4–13)
AST (SGOT): 16 U/L (ref 8–45)
BILIRUBIN TOTAL: 0.6 mg/dL (ref 0.3–1.3)
BUN/CREA RATIO: 12 (ref 6–22)
BUN: 12 mg/dL (ref 8–25)
CALCIUM: 10 mg/dL (ref 8.6–10.3)
CHLORIDE: 106 mmol/L (ref 96–111)
CO2 TOTAL: 25 mmol/L (ref 23–31)
CREATININE: 0.98 mg/dL (ref 0.60–1.05)
ESTIMATED GFR - FEMALE: 60 mL/min/BSA (ref >=60)
GLUCOSE: 163 mg/dL — ABNORMAL HIGH (ref 70–99)
POTASSIUM: 3.5 mmol/L (ref 3.5–5.1)
PROTEIN TOTAL: 6.9 g/dL (ref 6.0–8.0)
SODIUM: 142 mmol/L (ref 136–145)

## 2022-09-22 LAB — CBC WITH DIFF
BASOPHIL #: <0.1 10*3/uL (ref <=0.20)
BASOPHIL %: 1 %
EOSINOPHIL #: <0.1 10*3/uL (ref <=0.50)
EOSINOPHIL %: 2 %
HCT: 37.9 % (ref 34.8–46.0)
HGB: 12.5 g/dL (ref 11.5–16.0)
IMMATURE GRANULOCYTE #: <0.1 10*3/uL (ref <0.10)
IMMATURE GRANULOCYTE %: 0 % (ref 0.0–1.0)
LYMPHOCYTE #: 1.75 10*3/uL (ref 1.00–4.80)
LYMPHOCYTE %: 33 %
MCH: 32.6 pg — ABNORMAL HIGH (ref 26.0–32.0)
MCHC: 33 g/dL (ref 31.0–35.5)
MCV: 99 fL (ref 78.0–100.0)
MONOCYTE #: 0.42 10*3/uL (ref 0.20–1.10)
MONOCYTE %: 8 %
MPV: 9.6 fL (ref 8.7–12.5)
NEUTROPHIL #: 3.06 10*3/uL (ref 1.50–7.70)
NEUTROPHIL %: 56 %
PLATELETS: 251 10*3/uL (ref 150–400)
RBC: 3.83 10*6/uL — ABNORMAL LOW (ref 3.85–5.22)
RDW-CV: 13.4 % (ref 11.5–15.5)
WBC: 5.4 10*3/uL (ref 3.7–11.0)

## 2022-09-22 LAB — HGA1C (HEMOGLOBIN A1C WITH EST AVG GLUCOSE)
ESTIMATED AVERAGE GLUCOSE: 171 mg/dL
HEMOGLOBIN A1C: 7.6 % — ABNORMAL HIGH (ref <=5.7)

## 2022-09-22 LAB — MICROALBUMIN/CREATININE RATIO, URINE, RANDOM
CREATININE RANDOM URINE: 222 mg/dL
MICROALBUMIN RANDOM URINE: 1.9 mg/dL
MICROALBUMIN/CREATININE RATIO RANDOM URINE: 8.6 mg/g (ref <30.0)

## 2022-09-22 LAB — THYROID STIMULATING HORMONE (SENSITIVE TSH): TSH: 0.173 u[IU]/mL — ABNORMAL LOW (ref 0.350–4.940)

## 2022-09-22 LAB — HEPATITIS C ANTIBODY SCREEN WITH REFLEX TO HCV PCR: HCV ANTIBODY QUALITATIVE: NEGATIVE

## 2022-09-22 NOTE — Telephone Encounter (Signed)
Can you sign off on these?  I will draw them  Theone Stanley, [place]  09/22/2022 08:40

## 2022-09-22 NOTE — Nursing Note (Signed)
Department of Community Practice     Venipuncture performed in office on right arm antecubital vein, dry pressure dressing was applied to site and patient tolerated it well.  Specimen was centrifuged, aliquoted as needed and specimen was labeled and packaged for transport.    Linda [name], [place]  09/22/2022, 15:45

## 2022-09-23 NOTE — Progress Notes (Unsigned)
OUTPATIENT PROGRESS NOTE    Subjective:   Patient ID:  Linda [name] is a pleasant 77 y.o. female.    Chief Complaint: No chief complaint on file.      History of Present Illness:  DM: Fasting FS range ***   Nonfasting FS range ***   Hypoglycemia episodes  {YES/NO:19957}   Compliant with diabetic diet {YES/NO:19957}   Sores on feet {YES/NO:19957}    HEMOGLOBIN A1C   Date Value Ref Range Status   09/22/2022 7.6 (H) <=5.7 % Final   01/09/2020 6.5 % Final     Results in Last 18 Months   Lab Test 09/22/22  0844   MICALBRNUR 1.9   MICALBCRERAT 8.6         Wt Readings from Last 3 Encounters:   06/18/22 95.6 kg (210 lb 11.2 oz)   03/19/22 98.6 kg (217 lb 4.8 oz)   12/05/21 97.3 kg (214 lb 9.6 oz)   HTN: HAs {YES/NO:19957}    Dizziness {YES/NO:19957}   Feeling like BP is too high or low {YES/NO:19957}   Compliant with taking meds {YES/NO:19957}   Home BP:  {Hypertension home bp:17448}    BP Readings from Last 3 Encounters:   06/18/22 110/60   03/19/22 116/62   12/05/21 130/60    HLD: {hyperlipidemia 1:28262}     {Desc; diets:16563}   Is pt fasting today? {YES/NO:19957}  Lab Results   Component Value Date    CHOLESTEROL 185 09/22/2022    HDLCHOL 40 (L) 09/22/2022    LDLCHOL 105 (H) 09/22/2022    LDLCHOLDIR 105 01/09/2020    TRIG 230 (H) 09/22/2022      Lab Results   Component Value Date    AST 16 09/22/2022    ALT 9 09/22/2022    Hypothyroidism: Hair changes {YES/NO:19957}     Skin changes {YES/NO:19957}     Diarrhea or constipation  {YES/NO:19957}     Heat or cold intolerance  {YES/NO:19957}     Palpitations {YES/NO:19957}     Compliant with taking Synthroid {YES/NO:19957}  THYROID STIMULATING HORMONE  Lab Results   Component Value Date    TSH 0.173 (L) 09/22/2022                   The history is provided by the patient.      Allergies:   No Known Allergies      Medications:   acetaminophen (TYLENOL) 500 mg Oral Tablet, Take 1 Tablet (500 mg total) by mouth Every 4 hours as needed for Pain  amLODIPine (NORVASC) 5 mg Oral  Tablet, take 1 tablet by mouth once daily  Diclofenac Sodium 3 % Gel, Apply topically  glimepiride (AMARYL) 2 mg Oral Tablet, Take 2 Tablets (4 mg total) by mouth Every morning with breakfast  levETIRAcetam (KEPPRA) 500 mg Oral Tablet, Take 1 Tablet (500 mg total) by mouth Twice daily  levothyroxine (SYNTHROID) 100 mcg Oral Tablet, Take 1 Tablet (100 mcg total) by mouth Every morning  pravastatin (PRAVACHOL) 80 mg Oral Tablet, take 1 tablet (80MG  TOTAL) by mouth once daily  predniSONE (DELTASONE) 5 mg Oral Tablet, Take 2 Tablets (10 mg total) by mouth Once a day  propranoloL (INDERAL) 10 mg Oral Tablet, Take 1 Tablet (10 mg total) by mouth Twice daily  tirzepatide (MOUNJARO) 10 mg/0.5 mL Subcutaneous Pen Injector, Inject 0.5 mL (10 mg total) under the skin Every 7 days  Valsartan-Hydrochlorothiazide 160-12.5 mg Oral Tablet, Take 1 Tablet by mouth Once a day    No  facility-administered medications prior to visit.        Immunization History:     Immunization History   Administered Date(s) Administered    Covid-19 Vaccine,Moderna,12 Years+ 04/06/2019, 05/06/2019, 01/04/2020    Influenza Vaccine, 6 month-adult 12/19/2016, 01/12/2020, 12/13/2020    PREVNAR 13 12/19/2016    Shingrix - Zoster Vaccine 12/12/2021         Past Medical History:     Past Medical History:   Diagnosis Date    Chronic pain of right ankle 12/19/2016    Essential hypertension, benign 12/17/2016    History of CVA (cerebrovascular accident) 09/12/2020    Hypothyroidism, adult 12/17/2016    Mixed hyperlipidemia 12/17/2016    Obstructive sleep apnea 12/17/2016    Seizure (CMS HCC)     Stroke (CMS HCC)     Type 2 diabetes mellitus without complication, without long-term current use of insulin (CMS HCC) 12/17/2016             Past Surgical History:     Past Surgical History:   Procedure Laterality Date    HX APPENDECTOMY      HX BACK SURGERY      REPLACEMENT TOTAL KNEE      SHOULDER SURGERY      VEIN LIGATION               Family History:     Family  Medical History:       Problem Relation (Age of Onset)    Leukemia Mother    Lymphoma Father                Social History:   Mandolin Falwell  reports that she has never smoked. She has never used smokeless tobacco. She reports that she does not drink alcohol and does not use drugs.          ROS:  As above in HPI, otherwise negative      Objective:   Vitals:  There were no vitals filed for this visit.       There is no height or weight on file to calculate BMI.      Constitutional: Alert, well developed, well nourished  HEENT:  Head: NC/AT    Eyes: Sclera anicteric, conjunctiva not injected    Ears: EAC normal, bilateral TMs clear    Nose: No discharge    Throat: MMM, posterior pharynx without erythema or exudate  Neck:   Supple with normal ROM, no cervical LAD, no thyromegaly, no JVD, no carotid bruits  Cardiovascular: RRR, normal S1/S2, no murmurs/rubs/gallops  Pulmonary:  CTAB, equal air entry, nonlabored, no wheezes/crackles/rhonchi  Abdomen:   NABS, NT/ND, soft, no HSM, no masses  Musculoskeletal:  No deformity, no injury, no edema  Neurological:   Alert, oriented x 3, no abnormal tone  Skin:     Warm, pink, dry, no rashes, no jaundice, no pallor, no cyanosis  Psychiatric:  Normal mood, affect, behavior, judgment, and thought content        Assessment & Plan:       ICD-10-CM    1. Mixed hyperlipidemia  E78.2       2. Essential hypertension, benign  I10       3. Obstructive sleep apnea  G47.33       4. Polymyalgia rheumatica (CMS HCC)  M35.3       5. Lumbar radiculopathy  M54.16       6. History of CVA (cerebrovascular accident)  [phone]  7. Convulsions, unspecified convulsion type (CMS HCC)  R56.9       8. 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 Type 2 diabetes mellitus without complication, without long-term current use of insulin (CMS HCC)  E11.9       10. Hypothyroidism, adult  E03.9       11. Vertigo  R42           No orders of the defined types were placed in this  encounter.                      Health Maintenance   Topic Date Due    Diabetic Retinal Exam  Never done    Osteoporosis screening  Never done    Pneumococcal Vaccination, Age 56+ (2 of 2 - PPSV23 or PCV20) 02/13/2017    Covid-19 Vaccine (4 - 2023-24 season) 11/08/2021    Influenza Vaccine (1) 11/09/2022    Diabetic A1C  03/25/2023    Depression Screening  06/18/2023    Diabetic Kidney Health eGFR  09/22/2023    Diabetic Kidney Health Microalb/Cr Ratio  09/22/2023    Adult Tdap-Td (2 - Td or Tdap) 05/16/2030    Hepatitis C screening  Completed    Shingles Vaccine  Completed    Medicare Annual Wellness Visit - Calendar Year Insurers  Completed    Meningococcal Vaccine  Aged Out   We did review patient diabetes status and need for continued diet, exercise and regular follow up with labs and A1c. We did rec yearly ophthalmology exams and also regular foot exam and did review foot care. Patient will continue to monitor blood glucose and call with any issues or concerns.     No follow-ups on file.    Algis Downs [employer] DO

## 2022-09-24 ENCOUNTER — Other Ambulatory Visit: Payer: Self-pay

## 2022-09-24 ENCOUNTER — Ambulatory Visit (INDEPENDENT_AMBULATORY_CARE_PROVIDER_SITE_OTHER): Payer: Medicare PPO | Admitting: Family Medicine

## 2022-09-24 ENCOUNTER — Encounter (INDEPENDENT_AMBULATORY_CARE_PROVIDER_SITE_OTHER): Payer: Self-pay | Admitting: Family Medicine

## 2022-09-24 ENCOUNTER — Telehealth (INDEPENDENT_AMBULATORY_CARE_PROVIDER_SITE_OTHER): Payer: Self-pay | Admitting: Family Medicine

## 2022-09-24 VITALS — BP 110/60 | HR 84 | Temp 97.5°F | Ht 66.0 in | Wt 201.0 lb

## 2022-09-24 DIAGNOSIS — I10 Essential (primary) hypertension: Secondary | ICD-10-CM

## 2022-09-24 DIAGNOSIS — E854 Organ-limited amyloidosis: Secondary | ICD-10-CM

## 2022-09-24 DIAGNOSIS — M353 Polymyalgia rheumatica: Secondary | ICD-10-CM

## 2022-09-24 DIAGNOSIS — E782 Mixed hyperlipidemia: Secondary | ICD-10-CM

## 2022-09-24 DIAGNOSIS — R569 Unspecified convulsions: Secondary | ICD-10-CM

## 2022-09-24 DIAGNOSIS — E039 Hypothyroidism, unspecified: Secondary | ICD-10-CM

## 2022-09-24 DIAGNOSIS — R42 Dizziness and giddiness: Secondary | ICD-10-CM

## 2022-09-24 DIAGNOSIS — Z8673 Personal history of transient ischemic attack (TIA), and cerebral infarction without residual deficits: Secondary | ICD-10-CM

## 2022-09-24 DIAGNOSIS — I680 Cerebral amyloid angiopathy: Secondary | ICD-10-CM

## 2022-09-24 DIAGNOSIS — E119 Type 2 diabetes mellitus without complications: Secondary | ICD-10-CM

## 2022-09-24 DIAGNOSIS — M5416 Radiculopathy, lumbar region: Secondary | ICD-10-CM

## 2022-09-24 DIAGNOSIS — G4733 Obstructive sleep apnea (adult) (pediatric): Secondary | ICD-10-CM

## 2022-09-24 NOTE — Telephone Encounter (Signed)
-----   Message from [employer] DO sent at 09/23/2022  7:36 AM EDT -----  Her a1c improved to 7.6%, her cholesterol improved. her other labs are stable , keep her regular follow up to review

## 2022-09-24 NOTE — Telephone Encounter (Signed)
Dr Means advised patient of lab results  Linda [name], [place]  09/24/2022 14:51

## 2022-09-29 ENCOUNTER — Other Ambulatory Visit (INDEPENDENT_AMBULATORY_CARE_PROVIDER_SITE_OTHER): Payer: Self-pay

## 2022-09-29 MED ORDER — VALSARTAN 160 MG-HYDROCHLOROTHIAZIDE 12.5 MG TABLET
1.0000 | ORAL_TABLET | Freq: Every day | ORAL | 4 refills | Status: DC
Start: 2022-09-29 — End: 2023-07-23

## 2022-09-29 NOTE — Telephone Encounter (Signed)
Reason for Encounter: Medication Adherence Quality Review    I reviewed Linda [name]'s chart and medication dispensing report for compliance with Valsartan/HCTZ. Upon review of the patient's chart and medication dispensing report, the patient last filled a 90 days supply on 05/14/22 and is past due for her seond fill this year.  After consulting and verifying with the patient's preferred pharmacy, the patient does not have any active refills on file. I pended a refill request for approval and will follow-up accordingly.     Thanks!  Ok Edwards, PHARMD

## 2022-10-08 ENCOUNTER — Other Ambulatory Visit (INDEPENDENT_AMBULATORY_CARE_PROVIDER_SITE_OTHER): Payer: Self-pay | Admitting: Family Medicine

## 2022-10-08 MED ORDER — PROPRANOLOL 10 MG TABLET: 10.0000 mg | ORAL_TABLET | Freq: Two times a day (BID) | ORAL | 3 refills | Status: DC

## 2022-11-09 ENCOUNTER — Other Ambulatory Visit (INDEPENDENT_AMBULATORY_CARE_PROVIDER_SITE_OTHER): Payer: Self-pay | Admitting: Family Medicine

## 2022-11-28 NOTE — Progress Notes (Signed)
 -------------------------------------------------------------------------------  Attestation signed by Fleet Huh, MD at 11/28/2022  2:06 PM  Cosigned by:  I did not treat the patient; however, I was in the clinic at the time of the service. I (or my partners) previously saw the patient and established the initial plan of care.    Fleet Huh, MD      Fleet Huh, MD            -------------------------------------------------------------------------------    NEUROLOGY PROGRESS NOTE  [facility]    11/28/2022    CC:  Seizure    HPI:   The following portions of the patient's history were reviewed and updated as appropriate: allergies, current medications, problem list, past medical/social/surgical history, and past family medical history.     CURRENT ASM:  Keppra  500 BID    Linda [name] is a 77 y.o. female who presents to our office for yearly follow-up.  Patient was last seen 11/29/2021 by myself.  At that time we had discussion regarding coming off of Keppra .  Patient decided to continue Keppra .  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  Wishes to stay on ASM for now.    No further headaches therefore she discontinued magnesium and riboflavin.    C/o feeling off balance.  Does not feel dizzy, feels lightheaded.  No vertiginous feelings.  Ongoing for a few months.  Started Mounjaro  a few months ago, having elevated BGL.  Has appt with PCP next week to discuss diabetes.  New recurrence of stroke-like symptoms including headache, facial droop, unilateral weakness.      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  She has been maintained on Keppra  500 mg b.i.d. since that time.      Seizure  type#1  Onset: 07/20/2020  Aura: None.   Ictal: LOC, left arm elevated and flexed, then both arms became ridged with jerking, chewing mouth movements, bit her lip. Denies loss of bowel or bladder.   Duration: 30 seconds.   PI: confused for the rest of the day. charley horses in legs.   Frequency: 1 time  Last Seizure: 07/20/2020    CT HEAD WITHOUT 02/04/2022:  Extensive presumed chronic small vessel ischemic changes in the periventricular and deep white matter.  Probable chronic small infarct within the left frontal lobe. Additional  chronic lacunar infarct within the left pons. Numerous chronic microhemorrhages are not well visualized on CT.     MRI BRAIN 08/24/2020:  Numerous microhemorrhages predominantly in a peripheral distribution however there is a left thalamic and right medial cerebellar focus as well. Differential considerations would include amyloid aniopathy versus hypertensive hemorrhage.  No acute or subacute infarct. No pathologic intracranial enhancement.     EEG 09/05/2020:  This is an unremarkable EEG for patient of this age.  If seizures are highly suspected, a repeat study with sleep recording and or a 24 hour ambulatory EEG would be recommended.  Clinical correlation is indicated.      Allergies: No Known Allergies    Current Meds:    Current Outpatient Medications:   .  acetaminophen (TYLENOL) 500 MG tablet, Take 1 tablet (500 mg total) by mouth every 4 (four) hours as needed ., Disp: , Rfl:   .  amLODIPine  (NORVASC) 10 MG tablet, Take 1 tablet (10 mg total) by mouth ., Disp: , Rfl:   .  levETIRAcetam  (KEPPRA ) 500 MG tablet, Take 1 tablet (500 mg total) by mouth 2 (two) times a day ., Disp: 180 tablet, Rfl: 3  .  levothyroxine  (SYNTHROID) 150 MCG tablet, Take 1 tablet (150 mcg total) by mouth every morning ., Disp: , Rfl:   .  Mounjaro  10 mg/0.5 mL pnij pen, INJECT 10 MG SUBCUTANEOUSLY ONCE A WEEK, Disp: , Rfl:   .  pravastatin  (PRAVACHOL ) 80 MG tablet, Take 1 tablet (80 mg total) by mouth  daily ., Disp: , Rfl:   .  propranoloL  (INDERAL ) 10 MG tablet, Take 1 tablet (10 mg total) by mouth ., Disp: , Rfl:   .  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  FamHx: family history includes Leukemia in her mother; Lymphoma in her father.  SocHx:  reports that she has never smoked. She has never used smokeless tobacco. She reports that she does not drink alcohol and does not use drugs.     Objective:  Vital signs: (most recent): Blood pressure 120/68, pulse 60, temperature 96.9 F (36.1 C), temperature source Forehead, height 5' 6.5" (1.689 m), weight 90.3 kg (199 lb), SpO2 96%.           PHYSICAL EXAM:   GENERAL:   Blood pressure 120/68, pulse 60, temperature 96.9 F (36.1 C), temperature source Forehead, height 5' 6.5" (1.689 m), weight 90.3 kg (199 lb), SpO2 96%.  CONSITUTIONAL: Patient is well nourished, well developed, in no acute distress.  HEENT: Normocephalic, atraumatic. Conjunctiva and oropharynx clear.  CARDIOVASCULAR: RRR  PULMONARY: CTAB. Normal respiratory effort.  ABDOMEN: Soft, non tender.  PERIPHERAL: No swelling, distal pulses intact.    NEUROLOGICAL EXAM:  MENTAL STATUS: Awake, A&O x3. Normal attention and concentration. Speech is without dysarthria. Fund of knowledge is appropriate.  CRANIAL NERVES: Pupils equal, round, and reactive to light and accomodation. EOM intact without conjugate eye movements, no nystagmus noted. Visual fields are full to confrontation. Face is symmetric with intact facial sensation. Hearing is intact bilaterally. Tongue is midline without atrophy or fasciculations, movement is brisk. Soft palate and uvula elevates symmetrically. Shoulder shrug is intact bilaterally.   MOTOR: Normal bulk and  tone. No pronator drift. No tremors. No abnormal movements.  Strength of upper extremities; 5/5 throughout  Strength of lower extremities; 5/5  throughout  REFLEXES: Present and symmetric, 2+ throughout, flexor plantar response bilaterally  SENSORY: Normal light touch over all extremities. Romberg positive  COORDINATION: Finger to nose test intact bilaterally. Intact heel to shin bilaterally. Finger tapping is intact bilaterally.  GAIT:  unable to perform tandem gait, ataxic gait, titubation  PSYCH: Patient is cooperative and friendly, mood and affect is appropriate.      I reviewed relevant test results:  No results found for: "CBC", "VITAMINB12", "TSH", "VITAMIND", "BMP"         ASSESSMENT:  Linda [name] was seen today for follow-up.    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 female with a past medical history of hypertension, hyperlipidemia, hypothyroidism, diabetes, cerebellar stroke (2022), seizure, who presents to our office for yearly follow-up.  Patient wishes to stay on ASM for now.  Current complaints include instability on her feet, feels like she is staggering.  She did have ataxic gait.  Normal coordination and visual fields were full.  She did have prior cerebellar stroke which is likely etiology of her imbalance however this problem is new.  I would like to rule out any new stroke with CT.  I did refer her to neuro ENT for further evaluation as well as vestibular therapy.      PLAN:  - continue Keppra  500 mg b.i.d.  - referral to vestibular therapy and neuro ENT for evaluation of gait instability      Risks/benefits/side effects of medications were discussed with the patient and family. Patient's records were reviewed at length with the patient.  Return to clinic in 1 year. Call as needed  with questions/concerns    Dr. Valeriano was available at the time of service  I rendered all services in the patient encounter. A physician of the practice was available via telecommunication and/or in the office at the time services were rendered. Supervising Provider: Dr. Fleet Huh     Please note: This note was dictated but not read. Notes are transcribed using voice recognition software. Because of this technology there are often unintended grammatical, spelling, and other transcription errors which should be disregarded.    Sherie Dine, PA-C  [facility]  [address], [address]  [place], [place] [address]

## 2023-01-01 ENCOUNTER — Ambulatory Visit (INDEPENDENT_AMBULATORY_CARE_PROVIDER_SITE_OTHER): Payer: Medicare PPO

## 2023-01-01 ENCOUNTER — Other Ambulatory Visit: Payer: Self-pay

## 2023-01-01 ENCOUNTER — Other Ambulatory Visit: Payer: Medicare PPO | Attending: Family Medicine | Admitting: Family Medicine

## 2023-01-01 DIAGNOSIS — E119 Type 2 diabetes mellitus without complications: Secondary | ICD-10-CM | POA: Insufficient documentation

## 2023-01-01 DIAGNOSIS — E039 Hypothyroidism, unspecified: Secondary | ICD-10-CM | POA: Insufficient documentation

## 2023-01-01 DIAGNOSIS — E782 Mixed hyperlipidemia: Secondary | ICD-10-CM | POA: Insufficient documentation

## 2023-01-01 DIAGNOSIS — I10 Essential (primary) hypertension: Secondary | ICD-10-CM

## 2023-01-01 LAB — COMPREHENSIVE METABOLIC PNL, FASTING
ALBUMIN: 3.9 g/dL (ref 3.4–4.8)
ALKALINE PHOSPHATASE: 40 U/L — ABNORMAL LOW (ref 55–145)
ALT (SGPT): 9 U/L (ref <31)
ANION GAP: 9 mmol/L (ref 4–13)
AST (SGOT): 17 U/L (ref 11–34)
BILIRUBIN TOTAL: 0.8 mg/dL (ref 0.3–1.3)
BUN/CREA RATIO: 17 (ref 6–22)
BUN: 19 mg/dL (ref 8–25)
CALCIUM: 10.3 mg/dL (ref 8.6–10.3)
CHLORIDE: 106 mmol/L (ref 96–111)
CO2 TOTAL: 24 mmol/L (ref 23–31)
CREATININE: 1.13 mg/dL — ABNORMAL HIGH (ref 0.60–1.05)
ESTIMATED GFR - FEMALE: 50 mL/min/BSA — ABNORMAL LOW (ref >=60)
GLUCOSE: 169 mg/dL — ABNORMAL HIGH (ref 70–99)
POTASSIUM: 4 mmol/L (ref 3.5–5.1)
PROTEIN TOTAL: 6.7 g/dL (ref 6.0–8.0)
SODIUM: 139 mmol/L (ref 136–145)

## 2023-01-01 LAB — CBC WITH DIFF
BASOPHIL #: <0.1 10*3/uL (ref <=0.20)
BASOPHIL %: 0.6 %
EOSINOPHIL #: <0.1 10*3/uL (ref <=0.50)
EOSINOPHIL %: 0.3 %
HCT: 37.4 % (ref 34.8–46.0)
HGB: 12.2 g/dL (ref 11.5–16.0)
IMMATURE GRANULOCYTE #: <0.1 10*3/uL (ref <0.10)
IMMATURE GRANULOCYTE %: 0.3 % (ref 0.0–1.0)
LYMPHOCYTE #: 2.19 10*3/uL (ref 1.00–4.80)
LYMPHOCYTE %: 32.7 %
MCH: 32.7 pg — ABNORMAL HIGH (ref 26.0–32.0)
MCHC: 32.6 g/dL (ref 31.0–35.5)
MCV: 100.3 fL — ABNORMAL HIGH (ref 78.0–100.0)
MONOCYTE #: 0.58 10*3/uL (ref 0.20–1.10)
MONOCYTE %: 8.7 %
MPV: 9.6 fL (ref 8.7–12.5)
NEUTROPHIL #: 3.85 10*3/uL (ref 1.50–7.70)
NEUTROPHIL %: 57.4 %
PLATELETS: 214 10*3/uL (ref 150–400)
RBC: 3.73 10*6/uL — ABNORMAL LOW (ref 3.85–5.22)
RDW-CV: 14.2 % (ref 11.5–15.5)
WBC: 6.7 10*3/uL (ref 3.7–11.0)

## 2023-01-01 LAB — HGA1C (HEMOGLOBIN A1C WITH EST AVG GLUCOSE)
ESTIMATED AVERAGE GLUCOSE: 194 mg/dL
HEMOGLOBIN A1C: 8.4 % — ABNORMAL HIGH (ref <=5.7)

## 2023-01-01 LAB — LIPID PANEL
CHOL/HDL RATIO: 4
CHOLESTEROL: 202 mg/dL — ABNORMAL HIGH (ref 100–200)
HDL CHOL: 51 mg/dL (ref >=50)
LDL CALC: 128 mg/dL — ABNORMAL HIGH (ref <100)
NON-HDL: 151 mg/dL (ref <=190)
TRIGLYCERIDES: 128 mg/dL (ref <150)
VLDL CALC: 22 mg/dL (ref <30)

## 2023-01-01 LAB — THYROID STIMULATING HORMONE (SENSITIVE TSH): TSH: 3.644 u[IU]/mL (ref 0.350–4.940)

## 2023-01-01 LAB — T3 (TRIIODOTHYRONINE), FREE, SERUM: T3 FREE: 2.5 pg/mL (ref 1.7–3.7)

## 2023-01-01 LAB — THYROXINE, FREE (FREE T4): THYROXINE (T4), FREE: 1.11 ng/dL (ref 0.70–1.48)

## 2023-01-01 NOTE — Nursing Note (Signed)
Department of Community Practice     Venipuncture performed in office on right arm antecubital vein, dry pressure dressing was applied to site and patient tolerated it well.  Specimen was centrifuged, aliquoted as needed and specimen was labeled and packaged for transport.    Linda [name], [place]  01/01/2023, 11:49

## 2023-01-02 ENCOUNTER — Telehealth (INDEPENDENT_AMBULATORY_CARE_PROVIDER_SITE_OTHER): Payer: Self-pay | Admitting: Family Medicine

## 2023-01-02 NOTE — Telephone Encounter (Signed)
-----   Message from [employer]. sent at 01/02/2023  8:16 AM EDT -----  Keep follow up , her a1c increased to 8.4% and his cholesterol increased, will review at visit

## 2023-01-02 NOTE — Telephone Encounter (Signed)
Left message for patient about her lab results  Theone Stanley, [place]  01/02/2023 10:50

## 2023-01-05 NOTE — Progress Notes (Unsigned)
OUTPATIENT PROGRESS NOTE    Subjective:   Patient ID:  Linda [name] is a pleasant 77 y.o. female.    Chief Complaint: No chief complaint on file.      History of Present Illness:  DM: Fasting FS range ***   Nonfasting FS range ***   Hypoglycemia episodes  {YES/NO:19957}   Compliant with diabetic diet {YES/NO:19957}   Sores on feet {YES/NO:19957}    HEMOGLOBIN A1C   Date Value Ref Range Status   01/01/2023 8.4 (H) <=5.7 % Final   01/09/2020 6.5 % Final     Results in Last 18 Months   Lab Test 09/22/22  0844   MICALBRNUR 1.9   MICALBCRERAT 8.6         Wt Readings from Last 3 Encounters:   09/24/22 91.2 kg (201 lb)   06/18/22 95.6 kg (210 lb 11.2 oz)   03/19/22 98.6 kg (217 lb 4.8 oz)   HTN: HAs {YES/NO:19957}    Dizziness {YES/NO:19957}   Feeling like BP is too high or low {YES/NO:19957}   Compliant with taking meds {YES/NO:19957}   Home BP:  {Hypertension home bp:17448}    BP Readings from Last 3 Encounters:   09/24/22 110/60   06/18/22 110/60   03/19/22 116/62    HLD: {hyperlipidemia 1:28262}     {Desc; diets:16563}   Is pt fasting today? {YES/NO:19957}  Lab Results   Component Value Date    CHOLESTEROL 202 (H) 01/01/2023    HDLCHOL 51 01/01/2023    LDLCHOL 128 (H) 01/01/2023    LDLCHOLDIR 105 01/09/2020    TRIG 128 01/01/2023      Lab Results   Component Value Date    AST 17 01/01/2023    ALT 9 01/01/2023    Hypothyroidism: Hair changes {YES/NO:19957}     Skin changes {YES/NO:19957}     Diarrhea or constipation  {YES/NO:19957}     Heat or cold intolerance  {YES/NO:19957}     Palpitations {YES/NO:19957}     Compliant with taking Synthroid {YES/NO:19957}  THYROID STIMULATING HORMONE  Lab Results   Component Value Date    TSH 3.644 01/01/2023                   The history is provided by the patient.      Allergies:   No Known Allergies      Medications:   acetaminophen (TYLENOL) 500 mg Oral Tablet, Take 1 Tablet (500 mg total) by mouth Every 4 hours as needed for Pain  amLODIPine (NORVASC) 5 mg Oral Tablet, take 1 tablet  by mouth once daily  Diclofenac Sodium 3 % Gel, Apply topically  glimepiride (AMARYL) 2 mg Oral Tablet, Take 2 Tablets (4 mg total) by mouth Every morning with breakfast  levETIRAcetam (KEPPRA) 500 mg Oral Tablet, Take 1 Tablet (500 mg total) by mouth Twice daily  levothyroxine (SYNTHROID) 100 mcg Oral Tablet, Take 1 Tablet (100 mcg total) by mouth Every morning  pravastatin (PRAVACHOL) 80 mg Oral Tablet, take 1 tablet (80MG  TOTAL) by mouth once daily  predniSONE (DELTASONE) 5 mg Oral Tablet, Take 2 Tablets (10 mg total) by mouth Once a day  propranoloL (INDERAL) 10 mg Oral Tablet, Take 1 Tablet (10 mg total) by mouth Twice daily  tirzepatide (MOUNJARO) 10 mg/0.5 mL Subcutaneous Pen Injector, INJECT 10 MG SUBCUTANEOUSLY ONCE A WEEK  Valsartan-Hydrochlorothiazide 160-12.5 mg Oral Tablet, Take 1 Tablet by mouth Once a day    No facility-administered medications prior to visit.  Immunization History:     Immunization History   Administered Date(s) Administered    Covid-19 Vaccine,Moderna,12 Years+ 04/06/2019, 05/06/2019, 01/04/2020    Influenza Vaccine, 6 month-adult 12/19/2016, 01/12/2020, 12/13/2020    PREVNAR 13 12/19/2016    Shingrix - Zoster Vaccine 12/12/2021         Past Medical History:     Past Medical History:   Diagnosis Date    Chronic pain of right ankle 12/19/2016    Essential hypertension, benign 12/17/2016    History of CVA (cerebrovascular accident) 09/12/2020    Hypothyroidism, adult 12/17/2016    Mixed hyperlipidemia 12/17/2016    Obstructive sleep apnea 12/17/2016    Seizure (CMS HCC)     Stroke (CMS HCC)     Type 2 diabetes mellitus without complication, without long-term current use of insulin (CMS HCC) 12/17/2016             Past Surgical History:     Past Surgical History:   Procedure Laterality Date    HX APPENDECTOMY      HX BACK SURGERY      REPLACEMENT TOTAL KNEE      SHOULDER SURGERY      VEIN LIGATION               Family History:     Family Medical History:       Problem Relation (Age  of Onset)    Leukemia Mother    Lymphoma Father                Social History:   Darla Bligh  reports that she has never smoked. She has never used smokeless tobacco. She reports that she does not drink alcohol and does not use drugs.          ROS:  As above in HPI, otherwise negative      Objective:   Vitals:  There were no vitals filed for this visit.       There is no height or weight on file to calculate BMI.      Constitutional: Alert, well developed, well nourished  HEENT:  Head: NC/AT    Eyes: Sclera anicteric, conjunctiva not injected    Ears: EAC normal, bilateral TMs clear    Nose: No discharge    Throat: MMM, posterior pharynx without erythema or exudate  Neck:   Supple with normal ROM, no cervical LAD, no thyromegaly, no JVD, no carotid bruits  Cardiovascular: RRR, normal S1/S2, no murmurs/rubs/gallops  Pulmonary:  CTAB, equal air entry, nonlabored, no wheezes/crackles/rhonchi  Abdomen:   NABS, NT/ND, soft, no HSM, no masses  Musculoskeletal:  No deformity, no injury, no edema  Neurological:   Alert, oriented x 3, no abnormal tone  Skin:     Warm, pink, dry, no rashes, no jaundice, no pallor, no cyanosis  Psychiatric:  Normal mood, affect, behavior, judgment, and thought content        Assessment & Plan:       ICD-10-CM    1. Essential hypertension, benign  I10       2. Mixed hyperlipidemia  E78.2       3. Obstructive sleep apnea  G47.33       4. 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 Convulsions, unspecified convulsion type (CMS HCC)  R56.9       6. History of CVA (cerebrovascular accident)  [phone]  7. Lumbar radiculopathy  M54.16       8. Polymyalgia rheumatica (CMS HCC)  M35.3       9. Hypothyroidism, adult  E03.9       10. Type 2 diabetes mellitus without complication, without long-term current use of insulin (CMS HCC)  E11.9       11. Loss of balance  R26.89           No orders of the defined types were placed in this encounter.                      Health  Maintenance   Topic Date Due    Diabetic Retinal Exam  Never done    Osteoporosis screening  Never done    Pneumococcal Vaccination, Age 106+ (2 of 2 - PPSV23 or PCV20) 02/13/2017    Influenza Vaccine (1) 11/09/2022    Covid-19 Vaccine (4 - 2023-24 season) 11/09/2022    Diabetic A1C  04/03/2023    Depression Screening  06/18/2023    Diabetic Kidney Health Microalb/Cr Ratio  09/22/2023    Diabetic Kidney Health eGFR  01/01/2024    Adult Tdap-Td (2 - Td or Tdap) 05/16/2030    Hepatitis C screening  Completed    Shingles Vaccine  Completed    Medicare Annual Wellness Visit - Calendar Year Insurers  Completed   We did review patient diabetes status and need for continued diet, exercise and regular follow up with labs and A1c. We did rec yearly ophthalmology exams and also regular foot exam and did review foot care. Patient will continue to monitor blood glucose and call with any issues or concerns.     No follow-ups on file.    Algis Downs [employer] DO

## 2023-01-06 ENCOUNTER — Encounter (INDEPENDENT_AMBULATORY_CARE_PROVIDER_SITE_OTHER): Payer: Self-pay | Admitting: Family Medicine

## 2023-01-06 ENCOUNTER — Other Ambulatory Visit: Payer: Self-pay

## 2023-01-06 ENCOUNTER — Ambulatory Visit (INDEPENDENT_AMBULATORY_CARE_PROVIDER_SITE_OTHER): Payer: Self-pay | Admitting: Family Medicine

## 2023-01-06 VITALS — BP 112/62 | HR 70 | Temp 96.8°F | Ht 66.0 in | Wt 190.8 lb

## 2023-01-06 DIAGNOSIS — I680 Cerebral amyloid angiopathy: Secondary | ICD-10-CM

## 2023-01-06 DIAGNOSIS — M353 Polymyalgia rheumatica: Secondary | ICD-10-CM

## 2023-01-06 DIAGNOSIS — M5416 Radiculopathy, lumbar region: Secondary | ICD-10-CM

## 2023-01-06 DIAGNOSIS — Z8673 Personal history of transient ischemic attack (TIA), and cerebral infarction without residual deficits: Secondary | ICD-10-CM

## 2023-01-06 DIAGNOSIS — I10 Essential (primary) hypertension: Secondary | ICD-10-CM

## 2023-01-06 DIAGNOSIS — R569 Unspecified convulsions: Secondary | ICD-10-CM

## 2023-01-06 DIAGNOSIS — Z7985 Long-term (current) use of injectable non-insulin antidiabetic drugs: Secondary | ICD-10-CM

## 2023-01-06 DIAGNOSIS — G4733 Obstructive sleep apnea (adult) (pediatric): Secondary | ICD-10-CM

## 2023-01-06 DIAGNOSIS — E119 Type 2 diabetes mellitus without complications: Secondary | ICD-10-CM

## 2023-01-06 DIAGNOSIS — Z23 Encounter for immunization: Secondary | ICD-10-CM

## 2023-01-06 DIAGNOSIS — Z7984 Long term (current) use of oral hypoglycemic drugs: Secondary | ICD-10-CM

## 2023-01-06 DIAGNOSIS — R2689 Other abnormalities of gait and mobility: Secondary | ICD-10-CM

## 2023-01-06 DIAGNOSIS — E782 Mixed hyperlipidemia: Secondary | ICD-10-CM

## 2023-01-06 DIAGNOSIS — E039 Hypothyroidism, unspecified: Secondary | ICD-10-CM

## 2023-01-06 MED ORDER — METFORMIN 500 MG TABLET
500.0000 mg | ORAL_TABLET | Freq: Every day | ORAL | 4 refills | Status: DC
Start: 2023-01-06 — End: 2024-01-01

## 2023-01-06 MED ORDER — LEVOTHYROXINE 88 MCG TABLET
88.0000 ug | ORAL_TABLET | Freq: Every morning | ORAL | 4 refills | Status: DC
Start: 2023-01-06 — End: 2023-04-20

## 2023-01-06 NOTE — Nursing Note (Signed)
Department of [employer]     Patient received vaccine in clinic.  Tolerated it well, given VIS sheet and was discharged to home.  Immunization administered       Name Date Dose VIS Date Route    Influenza Vaccine, 6 month-adult 01/06/2023 0.5 mL 10/14/2019 Intramuscular    Site: Left deltoid    Given By: Theone Stanley, MA    Manufacturer: [employer]    Lot: [id number]    NDC: [id number]            Theone Stanley, MA  01/06/2023, 14:48

## 2023-01-16 ENCOUNTER — Telehealth (INDEPENDENT_AMBULATORY_CARE_PROVIDER_SITE_OTHER): Payer: Self-pay | Admitting: Family Medicine

## 2023-01-16 NOTE — Telephone Encounter (Signed)
 Left detailed voice mail for the patient that starting 03/11/23 their [employer] Adv or [facility] insurance plan will no longer be in network with [facility] or the office. That if they would decide to remain patient's with the [facility], it is advisable to change insurance coverage. I left phone numbers for both Financial Counselors [phone] & [phone] if they require more info or have additional questions.

## 2023-02-08 ENCOUNTER — Other Ambulatory Visit (INDEPENDENT_AMBULATORY_CARE_PROVIDER_SITE_OTHER): Payer: Self-pay | Admitting: Family Medicine

## 2023-02-13 ENCOUNTER — Ambulatory Visit (HOSPITAL_COMMUNITY)
Admission: RE | Admit: 2023-02-13 | Discharge: 2023-02-13 | Disposition: A | Discharge status: Still In Hospital | Payer: Medicare PPO | Source: Ambulatory Visit

## 2023-02-13 ENCOUNTER — Other Ambulatory Visit: Payer: Self-pay

## 2023-02-13 ENCOUNTER — Other Ambulatory Visit (HOSPITAL_COMMUNITY): Payer: Self-pay

## 2023-02-13 DIAGNOSIS — R2689 Other abnormalities of gait and mobility: Secondary | ICD-10-CM

## 2023-02-13 NOTE — PT Evaluation (Signed)
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 Pt c/o dizziness, motion that worsens with rapid head rotation, bending over, or rolling out of bed to the left side.       PMH: DM, arthritis, CVA-2022, HOH, back injuries      Prior Level of Function: pt is independent  ambulation without a device, (+) driver, Husband, Charles present,     Dizziness Handicap Inventory(DHI):10    Neuromuscular Re-education: Special Tests:  (-) Smooth Pursuit  (-) Vestibular Ocular Reflex Slow  (+) Vestibular Ocular Reflex with Gait (+) Postural Sway  (+) Romberg x4 (30sec) (+) Loss of Balance x 10 E/C   (-) Cervicogenic Dizziness  (+) Hallpike / supine roll test: right <-- 10 1/2 (+) Nystagmus < 12 Seconds Spin/ motion   (+) BPPV Right ear: Horizontal canal  (+) Cervicalgia      Observation/posture:  Head 3/4"" Forward of Acromion     Cervical AROM:  Right Rotation (103) degrees  Left Rotation (88) degrees  Flexion (65) degrees  Extension (53) degrees               AROM        STRENGTH  RUE        WNL RLE        WNL RUE        4+/5 MMT RLE        4+/5 MMT   LUE        WNL LLE       WNL LUE        4+/5 MMT LLE       4+/5 MMT     Other/Special Tests: blood pressure: (sit) 111/74 HR 73 BPM  (stand) 102/68 HR 76 BPM  (supine-to-sit) N/T    Assessment:     The patient presents with:    BPPV     Right ear: Horizontal canal    Other (+) Right Cervicalgia abolished with Right Horizontal  Canailth Repositioning Maneuver x 1    The patient would benefit from skilled physical therapy to address the above identified deficits and to maximize function.    Rehab Potential: Good    Goals:     Short Term:     (-) Smooth Pursuit  (-) Vestibular Ocular Reflex Slow  (-) Vestibular Ocular Reflex with Gait  (-) Postural Sway  (-) Romberg x4 (30sec) No Loss of Balance E/C   (-) Cervicogenic Dizziness  (-) Hallpike/ supine roll test: Right (-) Nystagmus  (-) Seconds Spin/ motion   (-) BPPV Left ear: Horizontal canal  (-) Cervicalgia  (+) Cervical AROM > WNL    Long Term:     (+) Patient will ambulate unlimited distances without a device and good dynamic balance  (+) Patient will be independent with  Right Horizontal canal  Canalith repositioning maneuver  (+) Patient will decrease DHI score = 0 in order to increase functional independence and safety performing ADLs      Treatment/Education Today:  Evaluation( 30 minutes)  Canalith Repositioning( 15 minutes)  Neuro Re-ed( 15 minutes)  HEP-written packet- Vestibular Education   Monitor BP/HR/saO2  DHI    Total Treatment time:(  60 minutes         )  Units: 4    Plan:     Neuromuscular Re-ed  Therapeutic Activity  Canalith Repositioning Manual Therapy  Balance Training  Gait Training  Vestibular Therapy  HEP Instruction  Monitor BP/HR/SaO2  DHI        Frequency/Duration: 1  x/wk for 3-6 wk(s)    Treatment Plan reviewed with patient and patient in agreement: yes    Certification of Plan   From: _12/06/2024_______   To: __03/05/2025_______    This is to certify that the above named patient who is under my care requires outpatient therapy services as described in the above  Treatment plan. The plan of care that I have established/approved will be reviewed periodically to determine continued need.       ____________________________________ __________  ___________  Physician  Signature     Date   Time      Weston Anna, PT   02/13/2023 07:19

## 2023-02-17 ENCOUNTER — Encounter (INDEPENDENT_AMBULATORY_CARE_PROVIDER_SITE_OTHER): Payer: Self-pay | Admitting: Family Medicine

## 2023-02-23 ENCOUNTER — Ambulatory Visit
Admission: RE | Admit: 2023-02-23 | Discharge: 2023-02-23 | Disposition: A | Discharge status: Still In Hospital | Payer: Medicare PPO | Source: Ambulatory Visit

## 2023-02-23 ENCOUNTER — Other Ambulatory Visit: Payer: Self-pay

## 2023-02-23 NOTE — PT Treatment (Signed)
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    Visual Analogue Scale (ave. and high)  Cervical 0  Thoracic 0  UE 0  Head 0    Intensity no pain  Location no pain  Frequency no pain    % or #/day none  Duration none  Common Causes (2) none  Location: none      Home Exercise Program (PREP) Avoid  right sidelying and bending over x 48 hours.  Effect (1) (on distal Sx.) decreased dizziness, increased cervical AROM and balance    Number of Sessions/day (home) none  Number of Sessions/day (work) none      Neuromuscular Re-education: Special Tests:( 15  minutes)  (-) Smooth Pursuit  (-) Vestibular Ocular Reflex Slow  (+) Vestibular Ocular Reflex with Gait (-) Postural Sway  (-) Romberg x4 (30 sec) no Loss of Balance E/C  (-) Cervicogenic Dizziness   (+) Hallpike / supine roll test: Right <-- 3 1/2 beats (+) Nystagmus < 03 Seconds Spin/Motion  (+) BPPV Right ear : Horizontal canal  (-) Cervicalgia     Treatment:  Canalith Repositioning Maneuver: (+)  (15   minutes)  SaO2 98% HR 80 BMP  BP (sit) 110/72, Head position behind acromion 3/8"  Right rotation (105) degrees, Left rotation (103) degrees  Flexion (95) degrees , Extension (88) degrees  > 75% resolved right Horizontal canal BPPV   PT s/p right Horizontal canal Canalith Repositioning maneuver x1        Results:Cervical AROM: Head Position: 3/8" Behind Acromion  Right Rotation (105) degrees  Left Rotation (105) degrees  Flexion (95) degrees  Extension (90) degrees    Home Instructions:   avoid right  sidelying ane bending over x 48 hours  Monitor cervical AROM and balance  Total treatment time: (  30 minutes)  Units:2    Plan: will follow until 100% resolved right horizontal BPPV, cervical AROM > WNL and good dynamic balance    The risks/benefits of therapy have been discussed with the patient and he/she is in agreement with the established plan of care    Weston Anna, PT  02/23/2023 07:10

## 2023-02-26 ENCOUNTER — Other Ambulatory Visit (INDEPENDENT_AMBULATORY_CARE_PROVIDER_SITE_OTHER): Payer: Self-pay | Admitting: Family Medicine

## 2023-03-09 ENCOUNTER — Ambulatory Visit
Admission: RE | Admit: 2023-03-09 | Discharge: 2023-03-09 | Disposition: A | Discharge status: Still In Hospital | Payer: Medicare PPO | Source: Ambulatory Visit

## 2023-03-09 ENCOUNTER — Other Ambulatory Visit: Payer: Self-pay

## 2023-03-09 NOTE — PT Treatment (Signed)
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    Visual Analogue Scale (ave. and high)  Cervical 0  Thoracic 0  UE 0  Head 0    Intensity no pain  Location no pain  Frequency no pain    % or #/day none  Duration none  Common Causes (2) none  Location: none      Home Exercise Program (PREP) Avoid  right sidelying and bending over x 48 hours.  Effect (1) (on distal Sx.) decreased dizziness, increased cervical AROM and balance    Number of Sessions/day (home) none  Number of Sessions/day (work) none      Neuromuscular Re-education: Special Tests:( 15  minutes)  (-) Smooth Pursuit  (-) Vestibular Ocular Reflex Slow  (+) Vestibular Ocular Reflex with Gait (-) Postural Sway  (-) Romberg x4 (30 sec) no Loss of Balance E/C  (-) Cervicogenic Dizziness   (+) Hallpike/ supine roll test:  Right <--1 oscillation (+) Nystagmus < 0.50 Seconds Spin/Motion  (+) BPPV Right ear : Horizontal canal  (-) Cervicalgia     Treatment:  Canalith Repositioning Maneuver: (+)  ( 15  minutes)  SaO2 98% HR 74 BMP , BP (sit) 122/72, Head position behind acromion 3/8-1/2"  Right rotation (105) degrees, Left rotation (104) degrees  Flexion (95) degrees , Extension (89) degrees  > 99% resolved right Horizontal canal BPPV   PT s/p right Horizontal canal Canalith Repositioning maneuver x1        Results:Cervical AROM: Head Position: 1/2" Behind Acromion  Right Rotation (105) degrees  Left Rotation (105) degrees  Flexion (95) degrees  Extension (90) degrees    Home Instructions:   Avoid  right sidelying and bending over x 48 hours  Monitor cervical AROM and balance    Total treatment time: (30   minutes)  Units:2    Plan: will follow to ensure 100% resolved right Horizontal BPPV, cervical AROM > WNL and good dynam c balance    The risks/benefits of therapy have been discussed with the patient and he/she is in agreement with the established plan of care    Weston Anna, PT  03/09/2023 07:13

## 2023-03-16 ENCOUNTER — Ambulatory Visit (INDEPENDENT_AMBULATORY_CARE_PROVIDER_SITE_OTHER): Payer: Self-pay | Admitting: Family Medicine

## 2023-03-16 NOTE — Telephone Encounter (Signed)
Submitted prior auth on cover my meds for medication to insurance   03/16/23    Gari Crown, MA  03/16/2023 10:22

## 2023-03-25 ENCOUNTER — Ambulatory Visit (INDEPENDENT_AMBULATORY_CARE_PROVIDER_SITE_OTHER): Payer: Self-pay | Admitting: Family Medicine

## 2023-03-25 NOTE — Telephone Encounter (Signed)
Regarding: Clinical Question  ----- Message from Algis Greenhouse sent at 03/23/2023 10:04 AM EST -----  Copied From CRM #[id number].  [name], Linda called with a clinical question.     The following needs auth through new [employer] - [id number]    tirzepatide [facility]) 10 mg/0.5 mL Subcutaneous Pen Injector    Preferred Pharmacy      [id number] - [place], [place] - [address]    [address] [place] [address]    Phone: [phone] Fax: [phone]    Hours: Not open 24 hours

## 2023-03-25 NOTE — Telephone Encounter (Signed)
Submitted prior auth for medication on cover my meds to insurance 03/25/23    Gari Crown, MA  03/25/2023 10:56

## 2023-04-14 ENCOUNTER — Other Ambulatory Visit (INDEPENDENT_AMBULATORY_CARE_PROVIDER_SITE_OTHER): Payer: Self-pay | Admitting: Family Medicine

## 2023-04-16 ENCOUNTER — Ambulatory Visit (INDEPENDENT_AMBULATORY_CARE_PROVIDER_SITE_OTHER): Payer: Medicare PPO

## 2023-04-16 ENCOUNTER — Other Ambulatory Visit: Payer: Self-pay

## 2023-04-16 ENCOUNTER — Other Ambulatory Visit: Payer: Medicare PPO | Attending: Family Medicine | Admitting: Family Medicine

## 2023-04-16 DIAGNOSIS — E119 Type 2 diabetes mellitus without complications: Secondary | ICD-10-CM | POA: Insufficient documentation

## 2023-04-16 DIAGNOSIS — E782 Mixed hyperlipidemia: Secondary | ICD-10-CM | POA: Insufficient documentation

## 2023-04-16 DIAGNOSIS — E039 Hypothyroidism, unspecified: Secondary | ICD-10-CM

## 2023-04-16 DIAGNOSIS — I10 Essential (primary) hypertension: Secondary | ICD-10-CM | POA: Insufficient documentation

## 2023-04-16 LAB — COMPREHENSIVE METABOLIC PNL, FASTING
ALBUMIN: 4.2 g/dL (ref 3.4–4.8)
ALKALINE PHOSPHATASE: 50 U/L — ABNORMAL LOW (ref 55–145)
ALT (SGPT): 7 U/L (ref <31)
ANION GAP: 9 mmol/L (ref 4–13)
AST (SGOT): 13 U/L (ref 11–34)
BILIRUBIN TOTAL: 0.6 mg/dL (ref 0.3–1.3)
BUN/CREA RATIO: 17 (ref 6–22)
BUN: 15 mg/dL (ref 8–25)
CALCIUM: 9.8 mg/dL (ref 8.6–10.3)
CHLORIDE: 107 mmol/L (ref 96–111)
CO2 TOTAL: 25 mmol/L (ref 23–31)
CREATININE: 0.89 mg/dL (ref 0.60–1.05)
ESTIMATED GFR - FEMALE: 67 mL/min/BSA (ref >=60)
GLUCOSE: 148 mg/dL — ABNORMAL HIGH (ref 70–99)
POTASSIUM: 4 mmol/L (ref 3.5–5.1)
PROTEIN TOTAL: 6.9 g/dL (ref 5.6–7.6)
SODIUM: 141 mmol/L (ref 136–145)

## 2023-04-16 LAB — CBC WITH DIFF
BASOPHIL #: <0.1 10*3/uL (ref <=0.20)
BASOPHIL %: 0.8 %
EOSINOPHIL #: <0.1 10*3/uL (ref <=0.50)
EOSINOPHIL %: 0.6 %
HCT: 38.4 % (ref 34.8–46.0)
HGB: 12.4 g/dL (ref 11.5–16.0)
IMMATURE GRANULOCYTE #: <0.1 10*3/uL (ref <0.10)
IMMATURE GRANULOCYTE %: 0.2 % (ref 0.0–1.0)
LYMPHOCYTE #: 2.18 10*3/uL (ref 1.00–4.80)
LYMPHOCYTE %: 35.2 %
MCH: 31.6 pg (ref 26.0–32.0)
MCHC: 32.3 g/dL (ref 31.0–35.5)
MCV: 98 fL (ref 78.0–100.0)
MONOCYTE #: 0.4 10*3/uL (ref 0.20–1.10)
MONOCYTE %: 6.5 %
MPV: 9.6 fL (ref 8.7–12.5)
NEUTROPHIL #: 3.51 10*3/uL (ref 1.50–7.70)
NEUTROPHIL %: 56.7 %
PLATELETS: 247 10*3/uL (ref 150–400)
RBC: 3.92 10*6/uL (ref 3.85–5.22)
RDW-CV: 13 % (ref 11.5–15.5)
WBC: 6.2 10*3/uL (ref 3.7–11.0)

## 2023-04-16 LAB — LIPID PANEL
CHOL/HDL RATIO: 3.8
CHOLESTEROL: 182 mg/dL (ref 100–200)
HDL CHOL: 48 mg/dL — ABNORMAL LOW (ref >=50)
LDL CALC: 103 mg/dL — ABNORMAL HIGH (ref <100)
NON-HDL: 134 mg/dL (ref <=190)
TRIGLYCERIDES: 182 mg/dL — ABNORMAL HIGH (ref <150)
VLDL CALC: 30 mg/dL — ABNORMAL HIGH (ref <30)

## 2023-04-16 LAB — THYROID STIMULATING HORMONE (SENSITIVE TSH): TSH: 6.971 u[IU]/mL — ABNORMAL HIGH (ref 0.350–4.940)

## 2023-04-16 LAB — HGA1C (HEMOGLOBIN A1C WITH EST AVG GLUCOSE)
ESTIMATED AVERAGE GLUCOSE: 140 mg/dL
HEMOGLOBIN A1C: 6.5 % — ABNORMAL HIGH (ref <=5.7)

## 2023-04-16 LAB — THYROXINE, FREE (FREE T4): THYROXINE (T4), FREE: 1.07 ng/dL (ref 0.70–1.48)

## 2023-04-16 LAB — T3 (TRIIODOTHYRONINE), FREE, SERUM: T3 FREE: 2.3 pg/mL (ref 1.7–3.7)

## 2023-04-16 MED ORDER — LEVETIRACETAM 500 MG TABLET
500.0000 mg | ORAL_TABLET | Freq: Two times a day (BID) | ORAL | 3 refills | Status: AC
Start: 2023-04-16 — End: ?

## 2023-04-16 NOTE — Nursing Note (Signed)
 Venipuncture x1 to R AC. Labs obtained. Pt tolerated well.

## 2023-04-19 ENCOUNTER — Ambulatory Visit (INDEPENDENT_AMBULATORY_CARE_PROVIDER_SITE_OTHER): Payer: Self-pay | Admitting: Family Medicine

## 2023-04-19 NOTE — Progress Notes (Unsigned)
OUTPATIENT PROGRESS NOTE    Subjective:   Patient ID:  Linda [name] is a pleasant 78 y.o. female.    Chief Complaint: No chief complaint on file.      History of Present Illness:  DM: Fasting FS range ***   Nonfasting FS range ***   Hypoglycemia episodes  {YES/NO:19957}   Compliant with diabetic diet {YES/NO:19957}   Sores on feet {YES/NO:19957}    HEMOGLOBIN A1C   Date Value Ref Range Status   04/16/2023 6.5 (H) <=5.7 % Final   01/09/2020 6.5 % Final     Results in Last 18 Months   Lab Test 09/22/22  0844   MICALBRNUR 1.9   MICALBCRERAT 8.6         Wt Readings from Last 3 Encounters:   01/06/23 86.5 kg (190 lb 12.8 oz)   09/24/22 91.2 kg (201 lb)   06/18/22 95.6 kg (210 lb 11.2 oz)   HTN: HAs {YES/NO:19957}    Dizziness {YES/NO:19957}   Feeling like BP is too high or low {YES/NO:19957}   Compliant with taking meds {YES/NO:19957}   Home BP:  {Hypertension home bp:17448}    BP Readings from Last 3 Encounters:   01/06/23 112/62   09/24/22 110/60   06/18/22 110/60    HLD: {hyperlipidemia 1:28262}     {Desc; diets:16563}   Is pt fasting today? {YES/NO:19957}  Lab Results   Component Value Date    CHOLESTEROL 182 04/16/2023    HDLCHOL 48 (L) 04/16/2023    LDLCHOL 103 (H) 04/16/2023    LDLCHOLDIR 105 01/09/2020    TRIG 182 (H) 04/16/2023      Lab Results   Component Value Date    AST 13 04/16/2023    ALT 7 04/16/2023    Hypothyroidism: Hair changes {YES/NO:19957}     Skin changes {YES/NO:19957}     Diarrhea or constipation  {YES/NO:19957}     Heat or cold intolerance  {YES/NO:19957}     Palpitations {YES/NO:19957}     Compliant with taking Synthroid {YES/NO:19957}  THYROID STIMULATING HORMONE  Lab Results   Component Value Date    TSH 6.971 (H) 04/16/2023                   The history is provided by the patient.      Allergies:   No Known Allergies      Medications:     Current Outpatient Medications   Medication Instructions    acetaminophen (TYLENOL) 500 mg, Oral, EVERY 4 HOURS PRN    amLODIPine (NORVASC) 5 mg, Oral, DAILY     Diclofenac Sodium 3 % Gel Topical    glimepiride (AMARYL) 4 mg, Oral, EVERY MORNING WITH BREAKFAST    levETIRAcetam (KEPPRA) 500 mg, Oral, 2 TIMES DAILY    levothyroxine (SYNTHROID) 88 mcg, Oral, EVERY MORNING    metFORMIN (GLUCOPHAGE) 500 mg, Oral, DAILY    pravastatin (PRAVACHOL) 80 mg Oral Tablet take 1 tablet (80MG  TOTAL) by mouth once daily    predniSONE (DELTASONE) 10 mg, Oral, DAILY    propranoloL (INDERAL) 10 mg, Oral, 2 TIMES DAILY    tirzepatide (MOUNJARO) 10 mg/0.5 mL Subcutaneous Pen Injector INJECT 10 MG SUBCUTANEOUSLY ONCE A WEEK    Valsartan-Hydrochlorothiazide 160-12.5 mg Oral Tablet 1 Tablet, Oral, DAILY         Immunization History:     Immunization History   Administered Date(s) Administered    Covid-19 Vaccine,Moderna,12 Years+ 04/06/2019, 05/06/2019, 01/04/2020    Influenza Vaccine, 6 month-adult 12/19/2016, 01/12/2020,  12/13/2020, 01/06/2023    PREVNAR 13 12/19/2016    Shingrix - Zoster Vaccine 12/12/2021         Past Medical History:     Past Medical History:   Diagnosis Date    Chronic pain of right ankle 12/19/2016    Essential hypertension, benign 12/17/2016    History of CVA (cerebrovascular accident) 09/12/2020    Hypothyroidism, adult 12/17/2016    Mixed hyperlipidemia 12/17/2016    Obstructive sleep apnea 12/17/2016    Seizure (CMS HCC)     Stroke (CMS HCC)     Type 2 diabetes mellitus without complication, without long-term current use of insulin (CMS HCC) 12/17/2016             Past Surgical History:     Past Surgical History:   Procedure Laterality Date    HX APPENDECTOMY      HX BACK SURGERY      REPLACEMENT TOTAL KNEE      SHOULDER SURGERY      VEIN LIGATION               Family History:     Family Medical History:       Problem Relation (Age of Onset)    Leukemia Mother    Lymphoma Father                Social History:   Ninfa Giannelli  reports that she has never smoked. She has never used smokeless tobacco. She reports that she does not drink alcohol and does not use  drugs.          Review of Systems: Other than ROS in HPI, all other systems are negative.      Objective:   Vitals:  There were no vitals filed for this visit.       There is no height or weight on file to calculate BMI.      Constitutional: Alert, well developed, well nourished  HEENT:  Head: NC/AT    Eyes: Sclera anicteric, conjunctiva not injected    Ears: EAC normal, bilateral TMs clear    Nose: No discharge    Throat: MMM, posterior pharynx without erythema or exudate  Neck:   Supple with normal ROM, no cervical LAD, no thyromegaly, no JVD, no carotid bruits  Cardiovascular: RRR, normal S1/S2, no murmurs/rubs/gallops  Pulmonary:  CTAB, equal air entry, nonlabored, no wheezes/crackles/rhonchi  Abdomen:   NABS, NT/ND, soft, no HSM, no masses  Musculoskeletal:  No deformity, no injury, no edema  Neurological:   Alert, oriented x 3, no abnormal tone  Skin:     Warm, pink, dry, no rashes, no jaundice, no pallor, no cyanosis  Psychiatric:  Normal mood, affect, behavior, judgment, and thought content    Results for orders placed or performed in visit on 04/16/23 (from the past 12 weeks)   CBC/DIFF    Narrative    The following orders were created for panel order CBC/DIFF.  Procedure                               Abnormality         Status                     ---------                               -----------         ------  CBC WITH ZOXW[[id number]]                                    Final result                 Please view results for these tests on the individual orders.   COMPREHENSIVE METABOLIC PNL, FASTING   Result Value Ref Range    SODIUM 141 136 - 145 mmol/L    POTASSIUM 4.0 3.5 - 5.1 mmol/L    CHLORIDE 107 96 - 111 mmol/L    CO2 TOTAL 25 23 - 31 mmol/L    ANION GAP 9 4 - 13 mmol/L    BUN 15 8 - 25 mg/dL    CREATININE 1.19 1.47 - 1.05 mg/dL    BUN/CREA RATIO 17 6 - 22    ALBUMIN 4.2 3.4 - 4.8 g/dL     CALCIUM 9.8 8.6 - 82.9 mg/dL    GLUCOSE 562 (H) 70 - 99 mg/dL    ALKALINE PHOSPHATASE 50  (L) 55 - 145 U/L    ALT (SGPT) 7 <31 U/L    AST (SGOT)  13 11 - 34 U/L    BILIRUBIN TOTAL 0.6 0.3 - 1.3 mg/dL    PROTEIN TOTAL 6.9 5.6 - 7.6 g/dL    ESTIMATED GFR - FEMALE 67 >=60 mL/min/BSA   LIPID PANEL   Result Value Ref Range    CHOLESTEROL  182 100 - 200 mg/dL    HDL CHOL 48 (L) >=13 mg/dL    TRIGLYCERIDES 086 (H) <150 mg/dL    LDL CALC 578 (H) <469 mg/dL    VLDL CALC 30 (H) <62 mg/dL    NON-HDL 952 <=841 mg/dL    CHOL/HDL RATIO 3.8    HGA1C (HEMOGLOBIN A1C WITH EST AVG GLUCOSE)   Result Value Ref Range    HEMOGLOBIN A1C 6.5 (H) <=5.7 %    ESTIMATED AVERAGE GLUCOSE 140 mg/dL    Narrative    ADA endorsed glycated hemoglobin decision points:  5.7-6.4% Pre-Diabetes   >6.5% Diabetes      Sensitive TSH   Result Value Ref Range    TSH 6.971 (H) 0.350 - 4.940 uIU/mL   T3, Free   Result Value Ref Range    T3 FREE 2.3 1.7 - 3.7 pg/mL    Narrative    Pregnant patients have different reference intervals derived from this analyzer/technology (Akarsu et al, 2016): 2.4-3.8 pg/mL    T4, Free   Result Value Ref Range    THYROXINE (T4), FREE 1.07 0.70 - 1.48 ng/dL   CBC WITH DIFF   Result Value Ref Range    WBC 6.2 3.7 - 11.0 x10^3/uL    RBC 3.92 3.85 - 5.22 x10^6/uL    HGB 12.4 11.5 - 16.0 g/dL    HCT 32.4 40.1 - 02.7 %    MCV 98.0 78.0 - 100.0 fL    MCH 31.6 26.0 - 32.0 pg    MCHC 32.3 31.0 - 35.5 g/dL    RDW-CV 25.3 66.4 - 40.3 %    PLATELETS 247 150 - 400 x10^3/uL    MPV 9.6 8.7 - 12.5 fL    NEUTROPHIL % 56.7 %    LYMPHOCYTE % 35.2 %    MONOCYTE % 6.5 %    EOSINOPHIL % 0.6 %    BASOPHIL % 0.8 %    NEUTROPHIL # 3.51 1.50 - 7.70 x10^3/uL  LYMPHOCYTE # 2.18 1.00 - 4.80 x10^3/uL    MONOCYTE # 0.40 0.20 - 1.10 x10^3/uL    EOSINOPHIL # <0.10 <=0.50 x10^3/uL    BASOPHIL # <0.10 <=0.20 x10^3/uL    IMMATURE GRANULOCYTE % 0.2 0.0 - 1.0 %    IMMATURE GRANULOCYTE # <0.10 <0.10 x10^3/uL         Assessment & Plan:                     Health Maintenance   Topic Date Due    Diabetic Retinal Exam  Never done    Osteoporosis screening   Never done    Pneumococcal Vaccination, Age 61+ (2 of 2 - PPSV23) 02/13/2017    RSV Adult 60+ or Pregnancy (1 - 1-dose 75+ series) Never done    Covid-19 Vaccine (4 - 2024-25 season) 11/09/2022    Medicare Annual Wellness Visit - Calendar Year Insurers  03/11/2023    Depression Screening  06/18/2023    Diabetic Kidney Health Microalb/Cr Ratio  09/22/2023    Diabetic A1C  10/14/2023    Diabetic Kidney Health eGFR  04/15/2024    Adult Tdap-Td (2 - Td or Tdap) 05/16/2030    Hepatitis C screening  Completed    Influenza Vaccine  Completed    Shingles Vaccine  Completed   We did review patient diabetes status and need for continued diet, exercise and regular follow up with labs and A1c. We did rec yearly ophthalmology exams and also regular foot exam and did review foot care. Patient will continue to monitor blood glucose and call with any issues or concerns.     No follow-ups on file.        Algis Downs [employer] DO

## 2023-04-20 ENCOUNTER — Other Ambulatory Visit: Payer: Self-pay

## 2023-04-20 ENCOUNTER — Ambulatory Visit (INDEPENDENT_AMBULATORY_CARE_PROVIDER_SITE_OTHER): Payer: Self-pay | Admitting: Family Medicine

## 2023-04-20 ENCOUNTER — Encounter (INDEPENDENT_AMBULATORY_CARE_PROVIDER_SITE_OTHER): Payer: Self-pay | Admitting: Family Medicine

## 2023-04-20 VITALS — BP 110/60 | HR 68 | Temp 97.8°F | Ht 66.0 in | Wt 179.0 lb

## 2023-04-20 DIAGNOSIS — G4733 Obstructive sleep apnea (adult) (pediatric): Secondary | ICD-10-CM

## 2023-04-20 DIAGNOSIS — E854 Organ-limited amyloidosis: Secondary | ICD-10-CM

## 2023-04-20 DIAGNOSIS — E039 Hypothyroidism, unspecified: Secondary | ICD-10-CM

## 2023-04-20 DIAGNOSIS — E782 Mixed hyperlipidemia: Secondary | ICD-10-CM

## 2023-04-20 DIAGNOSIS — R569 Unspecified convulsions: Secondary | ICD-10-CM

## 2023-04-20 DIAGNOSIS — Z8673 Personal history of transient ischemic attack (TIA), and cerebral infarction without residual deficits: Secondary | ICD-10-CM

## 2023-04-20 DIAGNOSIS — M5416 Radiculopathy, lumbar region: Secondary | ICD-10-CM

## 2023-04-20 DIAGNOSIS — E119 Type 2 diabetes mellitus without complications: Secondary | ICD-10-CM

## 2023-04-20 DIAGNOSIS — M353 Polymyalgia rheumatica: Secondary | ICD-10-CM

## 2023-04-20 DIAGNOSIS — M25571 Pain in right ankle and joints of right foot: Secondary | ICD-10-CM

## 2023-04-20 DIAGNOSIS — Z7985 Long-term (current) use of injectable non-insulin antidiabetic drugs: Secondary | ICD-10-CM

## 2023-04-20 DIAGNOSIS — R2689 Other abnormalities of gait and mobility: Secondary | ICD-10-CM

## 2023-04-20 DIAGNOSIS — I10 Essential (primary) hypertension: Secondary | ICD-10-CM

## 2023-04-20 DIAGNOSIS — Z7984 Long term (current) use of oral hypoglycemic drugs: Secondary | ICD-10-CM

## 2023-04-20 MED ORDER — LEVOTHYROXINE 100 MCG TABLET
100.0000 ug | ORAL_TABLET | Freq: Every morning | ORAL | 4 refills | Status: DC
Start: 2023-04-20 — End: 2023-07-24

## 2023-04-20 MED ORDER — DEXCOM G7 SENSOR DEVICE
5 refills | Status: AC
Start: 2023-04-20 — End: ?

## 2023-04-21 NOTE — Telephone Encounter (Signed)
-----   Message from [employer] DO sent at 04/19/2023  8:15 AM EST -----  her a1c is much improved at 6.5%, her thyroid is underactive and we will review her labs at her visit

## 2023-04-21 NOTE — Telephone Encounter (Signed)
Dr Means advised patient of lab results  Theone Stanley, [place]  04/21/2023 10:50

## 2023-05-28 ENCOUNTER — Other Ambulatory Visit (INDEPENDENT_AMBULATORY_CARE_PROVIDER_SITE_OTHER): Payer: Self-pay | Admitting: Family Medicine

## 2023-05-28 ENCOUNTER — Encounter (INDEPENDENT_AMBULATORY_CARE_PROVIDER_SITE_OTHER): Payer: Self-pay | Admitting: Family Medicine

## 2023-05-28 MED ORDER — MOUNJARO 7.5 MG/0.5 ML SUBCUTANEOUS PEN INJECTOR
7.5000 mg | PEN_INJECTOR | SUBCUTANEOUS | 4 refills | Status: DC
Start: 2023-05-28 — End: 2023-06-01

## 2023-06-01 ENCOUNTER — Other Ambulatory Visit (INDEPENDENT_AMBULATORY_CARE_PROVIDER_SITE_OTHER): Payer: Self-pay | Admitting: Family Medicine

## 2023-06-01 MED ORDER — MOUNJARO 7.5 MG/0.5 ML SUBCUTANEOUS PEN INJECTOR
7.5000 mg | PEN_INJECTOR | SUBCUTANEOUS | 4 refills | Status: DC
Start: 2023-06-01 — End: 2023-07-23

## 2023-07-22 ENCOUNTER — Encounter (INDEPENDENT_AMBULATORY_CARE_PROVIDER_SITE_OTHER): Payer: Self-pay | Admitting: Family Medicine

## 2023-07-22 NOTE — Progress Notes (Unsigned)
 OUTPATIENT PROGRESS NOTE    Subjective:   Patient ID:  Linda [name] is a pleasant 78 y.o. female.    Chief Complaint: No chief complaint on file.      History of Present Illness:  DM: Fasting FS range ***   Nonfasting FS range ***   Hypoglycemia episodes  {YES/NO:19957}   Compliant with diabetic diet {YES/NO:19957}   Sores on feet {YES/NO:19957}    HEMOGLOBIN A1C   Date Value Ref Range Status   04/16/2023 6.5 (H) <=5.7 % Final   01/09/2020 6.5 % Final     Results in Last 18 Months   Lab Test 09/22/22  0844   MICALBRNUR 1.9   MICALBCRERAT 8.6         Wt Readings from Last 3 Encounters:   04/20/23 81.2 kg (179 lb)   01/06/23 86.5 kg (190 lb 12.8 oz)   09/24/22 91.2 kg (201 lb)   HTN: HAs {YES/NO:19957}    Dizziness {YES/NO:19957}   Feeling like BP is too high or low {YES/NO:19957}   Compliant with taking meds {YES/NO:19957}   Home BP:  {Hypertension home bp:17448}    BP Readings from Last 3 Encounters:   04/20/23 110/60   01/06/23 112/62   09/24/22 110/60    HLD: {hyperlipidemia 1:28262}     {Desc; diets:16563}   Is pt fasting today? {YES/NO:19957}  Lab Results   Component Value Date    CHOLESTEROL 182 04/16/2023    HDLCHOL 48 (L) 04/16/2023    LDLCHOL 103 (H) 04/16/2023    LDLCHOLDIR 105 01/09/2020    TRIG 182 (H) 04/16/2023      Lab Results   Component Value Date    AST 13 04/16/2023    ALT 7 04/16/2023    Hypothyroidism: Hair changes {YES/NO:19957}     Skin changes {YES/NO:19957}     Diarrhea or constipation  {YES/NO:19957}     Heat or cold intolerance  {YES/NO:19957}     Palpitations {YES/NO:19957}     Compliant with taking Synthroid {YES/NO:19957}  THYROID  STIMULATING HORMONE  Lab Results   Component Value Date    TSH 6.971 (H) 04/16/2023                   The history is provided by the patient.      Allergies:   No Known Allergies      Medications:   acetaminophen (TYLENOL) 500 mg Oral Tablet, Take 1 Tablet (500 mg total) by mouth Every 4 hours as needed for Pain  amLODIPine  (NORVASC) 5 mg Oral Tablet, take 1  tablet by mouth once daily  Blood-Glucose Sensor (DEXCOM G7 SENSOR) Does not apply Device, use to check BS and change every 10 months  Diclofenac Sodium 3 % Gel, Apply topically  glimepiride  (AMARYL ) 2 mg Oral Tablet, Take 2 Tablets (4 mg total) by mouth Every morning with breakfast (Patient not taking: Reported on 04/20/2023)  levETIRAcetam  (KEPPRA ) 500 mg Oral Tablet, Take 1 Tablet (500 mg total) by mouth Twice daily  levothyroxine  (SYNTHROID) 100 mcg Oral Tablet, Take 1 Tablet (100 mcg total) by mouth Every morning  metFORMIN  (GLUCOPHAGE ) 500 mg Oral Tablet, Take 1 Tablet (500 mg total) by mouth Once a day  pravastatin  (PRAVACHOL ) 80 mg Oral Tablet, take 1 tablet (80MG  TOTAL) by mouth once daily  predniSONE  (DELTASONE ) 5 mg Oral Tablet, Take 2 Tablets (10 mg total) by mouth Once a day  propranoloL  (INDERAL ) 10 mg Oral Tablet, Take 1 Tablet (10 mg total) by mouth Twice daily  tirzepatide  (MOUNJARO ) 7.5 mg/0.5 mL Subcutaneous Pen Injector, Inject 0.5 mL (7.5 mg total) under the skin Every 7 days  Valsartan -Hydrochlorothiazide  160-12.5 mg Oral Tablet, Take 1 Tablet by mouth Once a day    No facility-administered medications prior to visit.        Immunization History:     Immunization History   Administered Date(s) Administered    Covid-19 Vaccine,Moderna,12 Years+ 04/06/2019, 05/06/2019, 01/04/2020    Influenza Vaccine, 6 month-adult 12/19/2016, 01/12/2020, 12/13/2020, 01/06/2023    PREVNAR 13  12/19/2016    Shingrix - Zoster Vaccine 12/12/2021         Past Medical History:     Past Medical History:   Diagnosis Date    Chronic pain of right ankle 12/19/2016    Essential hypertension, benign 12/17/2016    History of CVA (cerebrovascular accident) 09/12/2020    Hypothyroidism, adult 12/17/2016    Mixed hyperlipidemia 12/17/2016    Obstructive sleep apnea 12/17/2016    Seizure (CMS HCC)     Stroke (CMS HCC)     Type 2 diabetes mellitus without complication, without long-term current use of insulin 12/17/2016              Past Surgical History:     Past Surgical History:   Procedure Laterality Date    HX APPENDECTOMY      HX BACK SURGERY      REPLACEMENT TOTAL KNEE      SHOULDER SURGERY      VEIN LIGATION               Family History:     Family Medical History:       Problem Relation (Age of Onset)    Leukemia Mother    Lymphoma Father                Social History:   Alexcis Posadas  reports that she has never smoked. She has never used smokeless tobacco. She reports that she does not drink alcohol and does not use drugs.          ROS:  As above in HPI, otherwise negative      Objective:   Vitals:  There were no vitals filed for this visit.       There is no height or weight on file to calculate BMI.      Constitutional: Alert, well developed, well nourished  HEENT:  Head: NC/AT    Eyes: Sclera anicteric, conjunctiva not injected    Ears: EAC normal, bilateral TMs clear    Nose: No discharge    Throat: MMM, posterior pharynx without erythema or exudate  Neck:   Supple with normal ROM, no cervical LAD, no thyromegaly, no JVD, no carotid bruits  Cardiovascular: RRR, normal S1/S2, no murmurs/rubs/gallops  Pulmonary:  CTAB, equal air entry, nonlabored, no wheezes/crackles/rhonchi  Abdomen:   NABS, NT/ND, soft, no HSM, no masses  Musculoskeletal:  No deformity, no injury, no edema  Neurological:   Alert, oriented x 3, no abnormal tone  Skin:     Warm, pink, dry, no rashes, no jaundice, no pallor, no cyanosis  Psychiatric:  Normal mood, affect, behavior, judgment, and thought content        Assessment & Plan:       ICD-10-CM    1. Mixed hyperlipidemia  E78.2       2. 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 Convulsions, unspecified  convulsion type (CMS HCC)  R56.9       4. Essential hypertension, benign  I10       5. Obstructive sleep apnea  G47.33       6. Vertigo  R42       7. Polymyalgia rheumatica (CMS HCC)  M35.3       8. History of CVA (cerebrovascular accident)  Z86.73       9. Lumbar  radiculopathy  M54.16       10. Arthralgia, unspecified joint  M25.50       11. Type 2 diabetes mellitus without complication, without long-term current use of insulin  E11.9       12. Hypothyroidism, adult  E03.9       13. Chronic pain of right ankle  M25.571     G89.29       14. Loss of balance  R26.89           No orders of the defined types were placed in this encounter.                      Health Maintenance   Topic Date Due    Diabetic Retinal Exam  Never done    Osteoporosis screening  Never done    Pneumococcal Vaccination, Age 11+ (2 of 2 - PPSV23) 02/13/2017    RSV Adult 60+ or Pregnancy (1 - 1-dose 75+ series) Never done    Covid-19 Vaccine (4 - 2024-25 season) 11/09/2022    Medicare Annual Wellness Visit - Calendar Year Insurers  03/11/2023    Depression Screening  06/18/2023    Diabetic Kidney Health Microalb/Cr Ratio  09/22/2023    Diabetic A1C  10/14/2023    Diabetic Kidney Health eGFR  04/15/2024    Adult Tdap-Td (2 - Td or Tdap) 05/16/2030    Hepatitis C screening  Completed    Influenza Vaccine  Completed    Shingles Vaccine  Completed   We did review patient diabetes status and need for continued diet, exercise and regular follow up with labs and A1c. We did rec yearly ophthalmology exams and also regular foot exam and did review foot care. Patient will continue to monitor blood glucose and call with any issues or concerns.     No follow-ups on file.    Cleaster Curie [employer] DO

## 2023-07-23 ENCOUNTER — Other Ambulatory Visit: Payer: Self-pay

## 2023-07-23 ENCOUNTER — Ambulatory Visit (INDEPENDENT_AMBULATORY_CARE_PROVIDER_SITE_OTHER): Payer: Self-pay | Admitting: Family Medicine

## 2023-07-23 ENCOUNTER — Encounter (INDEPENDENT_AMBULATORY_CARE_PROVIDER_SITE_OTHER): Payer: Self-pay | Admitting: Family Medicine

## 2023-07-23 ENCOUNTER — Other Ambulatory Visit: Attending: Family Medicine | Admitting: Family Medicine

## 2023-07-23 VITALS — BP 110/62 | HR 71 | Temp 96.7°F | Ht 66.0 in | Wt 166.4 lb

## 2023-07-23 DIAGNOSIS — E782 Mixed hyperlipidemia: Secondary | ICD-10-CM | POA: Insufficient documentation

## 2023-07-23 DIAGNOSIS — I10 Essential (primary) hypertension: Secondary | ICD-10-CM | POA: Insufficient documentation

## 2023-07-23 DIAGNOSIS — E559 Vitamin D deficiency, unspecified: Secondary | ICD-10-CM

## 2023-07-23 DIAGNOSIS — R42 Dizziness and giddiness: Secondary | ICD-10-CM

## 2023-07-23 DIAGNOSIS — G8929 Other chronic pain: Secondary | ICD-10-CM

## 2023-07-23 DIAGNOSIS — M353 Polymyalgia rheumatica: Secondary | ICD-10-CM

## 2023-07-23 DIAGNOSIS — E119 Type 2 diabetes mellitus without complications: Secondary | ICD-10-CM

## 2023-07-23 DIAGNOSIS — M25571 Pain in right ankle and joints of right foot: Secondary | ICD-10-CM

## 2023-07-23 DIAGNOSIS — G4733 Obstructive sleep apnea (adult) (pediatric): Secondary | ICD-10-CM

## 2023-07-23 DIAGNOSIS — M2550 Pain in unspecified joint: Secondary | ICD-10-CM

## 2023-07-23 DIAGNOSIS — R569 Unspecified convulsions: Secondary | ICD-10-CM

## 2023-07-23 DIAGNOSIS — R2689 Other abnormalities of gait and mobility: Secondary | ICD-10-CM

## 2023-07-23 DIAGNOSIS — E039 Hypothyroidism, unspecified: Secondary | ICD-10-CM

## 2023-07-23 DIAGNOSIS — M5416 Radiculopathy, lumbar region: Secondary | ICD-10-CM

## 2023-07-23 DIAGNOSIS — I680 Cerebral amyloid angiopathy: Secondary | ICD-10-CM

## 2023-07-23 DIAGNOSIS — Z8673 Personal history of transient ischemic attack (TIA), and cerebral infarction without residual deficits: Secondary | ICD-10-CM

## 2023-07-23 DIAGNOSIS — E854 Organ-limited amyloidosis: Secondary | ICD-10-CM

## 2023-07-23 LAB — COMPREHENSIVE METABOLIC PNL, FASTING
ALBUMIN: 3.7 g/dL (ref 3.4–4.8)
ALKALINE PHOSPHATASE: 41 U/L — ABNORMAL LOW (ref 55–145)
ALT (SGPT): 9 U/L (ref <31)
ANION GAP: 8 mmol/L (ref 4–13)
AST (SGOT): 16 U/L (ref 11–34)
BILIRUBIN TOTAL: 0.7 mg/dL (ref 0.3–1.3)
BUN/CREA RATIO: 17 (ref 6–22)
BUN: 17 mg/dL (ref 8–25)
CALCIUM: 9.3 mg/dL (ref 8.6–10.3)
CHLORIDE: 111 mmol/L (ref 96–111)
CO2 TOTAL: 24 mmol/L (ref 23–31)
CREATININE: 1.02 mg/dL (ref 0.60–1.05)
ESTIMATED GFR - FEMALE: 57 mL/min/BSA — ABNORMAL LOW (ref >=60)
GLUCOSE: 127 mg/dL — ABNORMAL HIGH (ref 70–99)
POTASSIUM: 4.3 mmol/L (ref 3.5–5.1)
PROTEIN TOTAL: 6.1 g/dL (ref 5.6–7.6)
SODIUM: 143 mmol/L (ref 136–145)

## 2023-07-23 LAB — CBC WITH DIFF
BASOPHIL #: <0.1 10*3/uL (ref <=0.20)
BASOPHIL %: 0.3 %
EOSINOPHIL #: <0.1 10*3/uL (ref <=0.50)
EOSINOPHIL %: 0.5 %
HCT: 32.9 % — ABNORMAL LOW (ref 34.8–46.0)
HGB: 10.9 g/dL — ABNORMAL LOW (ref 11.5–16.0)
IMMATURE GRANULOCYTE #: <0.1 10*3/uL (ref <0.10)
IMMATURE GRANULOCYTE %: 0.3 % (ref 0.0–1.0)
LYMPHOCYTE #: 1.87 10*3/uL (ref 1.00–4.80)
LYMPHOCYTE %: 31 %
MCH: 32.6 pg — ABNORMAL HIGH (ref 26.0–32.0)
MCHC: 33.1 g/dL (ref 31.0–35.5)
MCV: 98.5 fL (ref 78.0–100.0)
MONOCYTE #: 0.48 10*3/uL (ref 0.20–1.10)
MONOCYTE %: 8 %
MPV: 9.7 fL (ref 8.7–12.5)
NEUTROPHIL #: 3.61 10*3/uL (ref 1.50–7.70)
NEUTROPHIL %: 59.9 %
PLATELETS: 250 10*3/uL (ref 150–400)
RBC: 3.34 10*6/uL — ABNORMAL LOW (ref 3.85–5.22)
RDW-CV: 14.1 % (ref 11.5–15.5)
WBC: 6 10*3/uL (ref 3.7–11.0)

## 2023-07-23 LAB — LIPID PANEL
CHOL/HDL RATIO: 3.7
CHOLESTEROL: 172 mg/dL (ref 100–200)
HDL CHOL: 47 mg/dL — ABNORMAL LOW (ref >=50)
LDL CALC: 102 mg/dL — ABNORMAL HIGH (ref <100)
NON-HDL: 125 mg/dL (ref <=190)
TRIGLYCERIDES: 126 mg/dL (ref <150)
VLDL CALC: 21 mg/dL (ref <30)

## 2023-07-23 LAB — T3 (TRIIODOTHYRONINE), FREE, SERUM: T3 FREE: 2.4 pg/mL (ref 1.7–3.7)

## 2023-07-23 LAB — VITAMIN D 25 TOTAL: VITAMIN D 25, TOTAL: 29.4 ng/mL (ref 20.0–100.0)

## 2023-07-23 LAB — HGA1C (HEMOGLOBIN A1C WITH EST AVG GLUCOSE)
ESTIMATED AVERAGE GLUCOSE: 148 mg/dL
HEMOGLOBIN A1C: 6.8 % — ABNORMAL HIGH (ref <=5.7)

## 2023-07-23 LAB — THYROXINE, FREE (FREE T4): THYROXINE (T4), FREE: 1.35 ng/dL (ref 0.70–1.48)

## 2023-07-23 LAB — THYROID STIMULATING HORMONE (SENSITIVE TSH): TSH: 0.035 u[IU]/mL — ABNORMAL LOW (ref 0.350–4.940)

## 2023-07-23 MED ORDER — MOUNJARO 2.5 MG/0.5 ML SUBCUTANEOUS PEN INJECTOR
2.5000 mg | PEN_INJECTOR | SUBCUTANEOUS | 4 refills | Status: DC
Start: 2023-07-23 — End: 2023-11-26

## 2023-07-23 NOTE — Nursing Note (Signed)
 07/23/23 1000   Fall Risk Assessment   Do you feel unsteady when standing or walking? Yes   Do you worry about falling? Yes   Have you fallen in the past year? No

## 2023-07-23 NOTE — Nursing Note (Signed)
 07/23/23 0959   Depression Screen   Little interest or pleasure in doing things. 0   Feeling down, depressed, or hopeless 0   PHQ 2 Total 0

## 2023-07-24 ENCOUNTER — Ambulatory Visit (INDEPENDENT_AMBULATORY_CARE_PROVIDER_SITE_OTHER): Payer: Self-pay | Admitting: Family Medicine

## 2023-07-24 DIAGNOSIS — E039 Hypothyroidism, unspecified: Secondary | ICD-10-CM

## 2023-07-24 MED ORDER — LEVOTHYROXINE 75 MCG TABLET
75.0000 ug | ORAL_TABLET | Freq: Every morning | ORAL | 4 refills | Status: AC
Start: 2023-07-24 — End: ?

## 2023-07-24 NOTE — Telephone Encounter (Signed)
 Advised patient of lab results.  She will call back about the GI dr.  Jesusita Morris, MA  07/24/2023 12:28

## 2023-07-24 NOTE — Telephone Encounter (Signed)
-----   Message from [employer] DO sent at 07/24/2023  7:23 AM EDT -----  her thyroid  is actually overactive probably due to her weight loss and I am decreasing her thyroid  dose and want to repeat labs in 6 weeks. This is also making her lose weight.  Her a1c is 6.8% and she is to take the lower mounjaro  dose.She has new anemia and I want to refer her to Gi to be evaluated to make sure no bleeding and with her appetite being so suppressed , please ask where she wants to go.

## 2023-07-28 ENCOUNTER — Encounter (INDEPENDENT_AMBULATORY_CARE_PROVIDER_SITE_OTHER): Payer: Self-pay

## 2023-09-10 ENCOUNTER — Other Ambulatory Visit: Payer: Self-pay

## 2023-09-10 ENCOUNTER — Other Ambulatory Visit: Attending: Family Medicine | Admitting: Family Medicine

## 2023-09-10 ENCOUNTER — Ambulatory Visit (INDEPENDENT_AMBULATORY_CARE_PROVIDER_SITE_OTHER)

## 2023-09-10 DIAGNOSIS — E039 Hypothyroidism, unspecified: Secondary | ICD-10-CM | POA: Insufficient documentation

## 2023-09-10 LAB — THYROID STIMULATING HORMONE (SENSITIVE TSH): TSH: 0.82 u[IU]/mL (ref 0.350–4.940)

## 2023-09-10 LAB — T3 (TRIIODOTHYRONINE), FREE, SERUM: T3 FREE: 2.2 pg/mL (ref 1.7–3.7)

## 2023-09-10 LAB — THYROXINE, FREE (FREE T4): THYROXINE (T4), FREE: 1.04 ng/dL (ref 0.70–1.48)

## 2023-09-12 ENCOUNTER — Ambulatory Visit (INDEPENDENT_AMBULATORY_CARE_PROVIDER_SITE_OTHER): Payer: Self-pay | Admitting: Family Medicine

## 2023-09-14 NOTE — Telephone Encounter (Signed)
-----   Message from [employer] DO sent at 09/12/2023  9:05 AM EDT -----  thyroid  panel is now normal range, but will follow as still on higher side of normal  ----- Message -----  From: Lab, Background User  Sent: 09/10/2023   2:47 PM EDT  To: Deward BRAVO Means Jr., DO

## 2023-09-14 NOTE — Telephone Encounter (Signed)
 Advised patient of lab results  Linda [name] Class, [place]  09/14/2023 12:55

## 2023-09-25 ENCOUNTER — Ambulatory Visit (INDEPENDENT_AMBULATORY_CARE_PROVIDER_SITE_OTHER): Payer: Self-pay | Admitting: Family Medicine

## 2023-09-25 ENCOUNTER — Telehealth (INDEPENDENT_AMBULATORY_CARE_PROVIDER_SITE_OTHER): Payer: Self-pay | Admitting: Family Medicine

## 2023-09-25 NOTE — Telephone Encounter (Signed)
 Regarding: Refill Request prior auth  ----- Message from Shona HERO sent at 09/25/2023  9:13 AM EDT -----  Copied From CRM [phone].[name], Linda (Self) called to request a prescription refill.     Pharm told pt rx needs new prior auth    tirzepatide  (MOUNJARO ) 2.5 mg/0.5 mL Subcutaneous Pen Injector    Preferred Pharmacy     [facility] - [place], [place] - [address]    [address] [place] [address]    Phone: [phone] Fax: [phone]    Hours: Not open 24 hours

## 2023-09-25 NOTE — Telephone Encounter (Signed)
 i did not see anything

## 2023-09-25 NOTE — Telephone Encounter (Signed)
 Did you see anything on this  Linda [name] Class, [place]  09/25/2023 09:47

## 2023-09-25 NOTE — Telephone Encounter (Signed)
 I submitted prior auth for medication mounjaro  2.5mg /0.62ml    Insurance is saying that the quantity exceeds the limit allowed       Stated limit allowed is the follow :    The current quantity covered by your Medicare Part D drug plan for this drug is: Mounjaro  2.5 mg/0.5 mL auto-injector, 4 mL per 365 days    Rosina Slocumb, MA  09/25/2023 14:54

## 2023-09-28 ENCOUNTER — Encounter (INDEPENDENT_AMBULATORY_CARE_PROVIDER_SITE_OTHER): Payer: Self-pay | Admitting: Family Medicine

## 2023-10-02 NOTE — Telephone Encounter (Signed)
 Started prior auth appeal for this medication   Rosina Slocumb, [place]  10/02/2023 07:39

## 2023-10-05 ENCOUNTER — Ambulatory Visit (INDEPENDENT_AMBULATORY_CARE_PROVIDER_SITE_OTHER): Payer: Self-pay | Admitting: Family Medicine

## 2023-10-05 NOTE — Telephone Encounter (Signed)
 I left patient a message about the appeal on the medication she is asking about  Linda [name] Class, [place]  10/05/2023 14:26

## 2023-10-05 NOTE — Telephone Encounter (Signed)
 I appealed this , I could take a couple weeks to hear anything back . I will try and reach out to insurance .   Rosina Slocumb, MA  10/05/2023 14:25

## 2023-10-05 NOTE — Telephone Encounter (Signed)
 Regarding: Nurse  ----- Message from Monica GRADE sent at 10/05/2023  2:14 PM EDT -----  Copied From CRM [phone].[name], Linda (Self) called with a clinical question.     Pt calling about her Prior Auth on tirzepatide  (MOUNJARO ) 2.5 mg/0.5 mL Subcutaneous Pen Injector.    Please call to advise [phone] or [phone]

## 2023-11-24 ENCOUNTER — Ambulatory Visit (INDEPENDENT_AMBULATORY_CARE_PROVIDER_SITE_OTHER)

## 2023-11-24 ENCOUNTER — Other Ambulatory Visit: Payer: Self-pay

## 2023-11-24 ENCOUNTER — Other Ambulatory Visit: Attending: Family Medicine | Admitting: Family Medicine

## 2023-11-24 DIAGNOSIS — I10 Essential (primary) hypertension: Secondary | ICD-10-CM

## 2023-11-24 DIAGNOSIS — E119 Type 2 diabetes mellitus without complications: Secondary | ICD-10-CM

## 2023-11-24 DIAGNOSIS — E039 Hypothyroidism, unspecified: Secondary | ICD-10-CM

## 2023-11-24 DIAGNOSIS — E782 Mixed hyperlipidemia: Secondary | ICD-10-CM | POA: Insufficient documentation

## 2023-11-24 LAB — COMPREHENSIVE METABOLIC PNL, FASTING
ALBUMIN: 3.9 g/dL (ref 3.4–4.8)
ALKALINE PHOSPHATASE: 60 U/L (ref 55–145)
ALT (SGPT): <7 U/L (ref <31)
ANION GAP: 7 mmol/L (ref 4–13)
AST (SGOT): 14 U/L (ref 11–34)
BILIRUBIN TOTAL: 0.5 mg/dL (ref 0.3–1.3)
BUN/CREA RATIO: 17 (ref 6–22)
BUN: 19 mg/dL (ref 8–25)
CALCIUM: 9.7 mg/dL (ref 8.6–10.3)
CHLORIDE: 112 mmol/L — ABNORMAL HIGH (ref 96–111)
CO2 TOTAL: 25 mmol/L (ref 23–31)
CREATININE: 1.09 mg/dL — ABNORMAL HIGH (ref 0.60–1.05)
GLUCOSE: 167 mg/dL — ABNORMAL HIGH (ref 70–99)
POTASSIUM: 4.2 mmol/L (ref 3.5–5.1)
PROTEIN TOTAL: 6.1 g/dL (ref 5.6–7.6)
SODIUM: 144 mmol/L (ref 136–145)
eGFRcr - FEMALE: 52 mL/min/1.73mˆ2 — ABNORMAL LOW (ref >=60)

## 2023-11-24 LAB — CBC WITH DIFF
BASOPHIL #: <0.1 x10ˆ3/uL (ref <=0.20)
BASOPHIL %: 0.7 %
EOSINOPHIL #: <0.1 x10ˆ3/uL (ref <=0.50)
EOSINOPHIL %: 0.7 %
HCT: 35.5 % (ref 34.8–46.0)
HGB: 11.5 g/dL (ref 11.5–16.0)
IMMATURE GRANULOCYTE #: <0.1 x10ˆ3/uL (ref <0.10)
IMMATURE GRANULOCYTE %: 0.2 % (ref 0.0–1.0)
LYMPHOCYTE #: 1.57 x10ˆ3/uL (ref 1.00–4.80)
LYMPHOCYTE %: 26 %
MCH: 31.4 pg (ref 26.0–32.0)
MCHC: 32.4 g/dL (ref 31.0–35.5)
MCV: 97 fL (ref 78.0–100.0)
MONOCYTE #: 0.42 x10ˆ3/uL (ref 0.20–1.10)
MONOCYTE %: 6.9 %
MPV: 10.3 fL (ref 8.7–12.5)
NEUTROPHIL #: 3.97 x10ˆ3/uL (ref 1.50–7.70)
NEUTROPHIL %: 65.5 %
PLATELETS: 213 x10ˆ3/uL (ref 150–400)
RBC: 3.66 x10ˆ6/uL — ABNORMAL LOW (ref 3.85–5.22)
RDW-CV: 13.1 % (ref 11.5–15.5)
WBC: 6.1 x10ˆ3/uL (ref 3.7–11.0)

## 2023-11-24 LAB — LIPID PANEL
CHOL/HDL RATIO: 4
CHOLESTEROL: 187 mg/dL (ref 100–200)
HDL CHOL: 47 mg/dL — ABNORMAL LOW (ref >=50)
LDL CALC: 116 mg/dL — ABNORMAL HIGH (ref <100)
NON-HDL: 140 mg/dL (ref <=190)
TRIGLYCERIDES: 137 mg/dL (ref <150)
VLDL CALC: 23 mg/dL (ref <30)

## 2023-11-24 LAB — THYROID STIMULATING HORMONE (SENSITIVE TSH): TSH: 3.634 u[IU]/mL (ref 0.350–4.940)

## 2023-11-24 LAB — T3 (TRIIODOTHYRONINE), FREE, SERUM: T3 FREE: 2.1 pg/mL (ref 1.7–3.7)

## 2023-11-24 LAB — HGA1C (HEMOGLOBIN A1C WITH EST AVG GLUCOSE)
ESTIMATED AVERAGE GLUCOSE: 154 mg/dL
HEMOGLOBIN A1C: 7 % — ABNORMAL HIGH (ref <=5.7)

## 2023-11-24 LAB — THYROXINE, FREE (FREE T4): THYROXINE (T4), FREE: 0.95 ng/dL (ref 0.70–1.48)

## 2023-11-24 NOTE — Nursing Note (Signed)
 Department of Community Practice     Venipuncture performed in office on right arm antecubital vein, dry pressure dressing was applied to site and patient tolerated it well.  Specimen was centrifuged, aliquoted as needed and specimen was labeled and packaged for transport.    Rosina Slocumb, MA  11/24/2023, 09:14

## 2023-11-25 NOTE — Progress Notes (Unsigned)
 OUTPATIENT PROGRESS NOTE    Subjective:   Patient ID:  Linda [name] is a pleasant 78 y.o. female.    Chief Complaint: No chief complaint on file.      History of Present Illness:  DM: Fasting FS range ***   Nonfasting FS range ***   Hypoglycemia episodes  {YES/NO:19957}   Compliant with diabetic diet {YES/NO:19957}   Sores on feet {YES/NO:19957}    HEMOGLOBIN A1C   Date Value Ref Range Status   11/24/2023 7.0 (H) <=5.7 % Final   01/09/2020 6.5 % Final     Results in Last 18 Months   Lab Test 09/22/22  0844   MICALBRNUR 1.9   MICALBCRERAT 8.6         Wt Readings from Last 3 Encounters:   07/23/23 75.5 kg (166 lb 6.4 oz)   04/20/23 81.2 kg (179 lb)   01/06/23 86.5 kg (190 lb 12.8 oz)   HTN: HAs {YES/NO:19957}    Dizziness {YES/NO:19957}   Feeling like BP is too high or low {YES/NO:19957}   Compliant with taking meds {YES/NO:19957}   Home BP:  {Hypertension home bp:17448}    BP Readings from Last 3 Encounters:   07/23/23 110/62   04/20/23 110/60   01/06/23 112/62    HLD: {hyperlipidemia 1:28262}     {Desc; diets:16563}   Is pt fasting today? {YES/NO:19957}  Lab Results   Component Value Date    CHOLESTEROL 187 11/24/2023    HDLCHOL 47 (L) 11/24/2023    LDLCHOL 116 (H) 11/24/2023    LDLCHOLDIR 105 01/09/2020    TRIG 137 11/24/2023      Lab Results   Component Value Date    AST 14 11/24/2023    ALT <7 11/24/2023    Hypothyroidism: Hair changes {YES/NO:19957}     Skin changes {YES/NO:19957}     Diarrhea or constipation  {YES/NO:19957}     Heat or cold intolerance  {YES/NO:19957}     Palpitations {YES/NO:19957}     Compliant with taking Synthroid {YES/NO:19957}  THYROID  STIMULATING HORMONE  Lab Results   Component Value Date    TSH 3.634 11/24/2023               .    The history is provided by the patient.      Allergies:   Allergies[1]      Medications:   acetaminophen (TYLENOL) 500 mg Oral Tablet, Take 1 Tablet (500 mg total) by mouth Every 4 hours as needed for Pain  Blood-Glucose Sensor (DEXCOM G7 SENSOR) Does not apply  Device, use to check BS and change every 10 months  Diclofenac Sodium 3 % Gel, Apply topically  levETIRAcetam  (KEPPRA ) 500 mg Oral Tablet, Take 1 Tablet (500 mg total) by mouth Twice daily  levothyroxine  (SYNTHROID) 75 mcg Oral Tablet, Take 1 Tablet (75 mcg total) by mouth Every morning  metFORMIN  (GLUCOPHAGE ) 500 mg Oral Tablet, Take 1 Tablet (500 mg total) by mouth Once a day  pravastatin  (PRAVACHOL ) 80 mg Oral Tablet, take 1 tablet (80MG  TOTAL) by mouth once daily  propranoloL  (INDERAL ) 10 mg Oral Tablet, Take 1 Tablet (10 mg total) by mouth Twice daily  tirzepatide  (MOUNJARO ) 2.5 mg/0.5 mL Subcutaneous Pen Injector, Inject 0.5 mL (2.5 mg total) under the skin Every 7 days    No facility-administered medications prior to visit.        Immunization History:     Immunization History   Administered Date(s) Administered    Covid-19 Vaccine,Moderna,12 Years+ 04/06/2019, 05/06/2019, 01/04/2020  Influenza Vaccine, 6 month-adult 12/19/2016, 01/12/2020, 12/13/2020, 01/06/2023    PREVNAR 13  12/19/2016    Shingrix - Zoster Vaccine 12/12/2021         Past Medical History:     Past Medical History:   Diagnosis Date    Chronic pain of right ankle 12/19/2016    Essential hypertension, benign 12/17/2016    History of CVA (cerebrovascular accident) 09/12/2020    Hypothyroidism, adult 12/17/2016    Mixed hyperlipidemia 12/17/2016    Obstructive sleep apnea 12/17/2016    Seizure (CMS HCC)     Stroke     Type 2 diabetes mellitus without complication, without long-term current use of insulin 12/17/2016             Past Surgical History:     Past Surgical History:   Procedure Laterality Date    HX APPENDECTOMY      HX BACK SURGERY      REPLACEMENT TOTAL KNEE      SHOULDER SURGERY      VEIN LIGATION               Family History:     Family Medical History:       Problem Relation (Age of Onset)    Leukemia Mother    Lymphoma Father                Social History:   Linda [name]  reports that she has never smoked. She has never used  smokeless tobacco. She reports that she does not drink alcohol and does not use drugs.          ROS:  As above in HPI, otherwise negative      Objective:   Vitals:  There were no vitals filed for this visit.       There is no height or weight on file to calculate BMI.      Constitutional: Alert, well developed, well nourished  HEENT:  Head: NC/AT    Eyes: Sclera anicteric, conjunctiva not injected    Ears: EAC normal, bilateral TMs clear    Nose: No discharge    Throat: MMM, posterior pharynx without erythema or exudate  Neck:   Supple with normal ROM, no cervical LAD, no thyromegaly, no JVD, no carotid bruits  Cardiovascular: RRR, normal S1/S2, no murmurs/rubs/gallops  Pulmonary:  CTAB, equal air entry, nonlabored, no wheezes/crackles/rhonchi  Abdomen:   NABS, NT/ND, soft, no HSM, no masses  Musculoskeletal:  No deformity, no injury, no edema  Neurological:   Alert, oriented x 3, no abnormal tone  Skin:     Warm, pink, dry, no rashes, no jaundice, no pallor, no cyanosis  Psychiatric:  Normal mood, affect, behavior, judgment, and thought content        Assessment & Plan:       ICD-10-CM    1. Essential hypertension, benign  I10       2. Mixed hyperlipidemia  E78.2       3. Obstructive sleep apnea  G47.33       4. 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 Convulsions, unspecified convulsion type (CMS HCC)  R56.9       6. History of CVA (cerebrovascular accident)  Z86.73       7. Lumbar radiculopathy  M54.16       8. Polymyalgia rheumatica (CMS HCC)  M35.3       9. Hypothyroidism, adult  E03.9       10. Type 2 diabetes mellitus without complication, without long-term current use of insulin  E11.9       11. Chronic pain of right ankle  M25.571     G89.29       12. Loss of balance  R26.89           No orders of the defined types were placed in this encounter.                      Health Maintenance   Topic Date Due    Diabetic Retinal Exam  Never done    Osteoporosis screening  Never  done    Pneumococcal Vaccination, Age 62+ (2 of 2 - PPSV23, PCV20, or PCV21) 02/13/2017    RSV Adult 60+ or Pregnancy (1 - 1-dose 75+ series) Never done    Diabetic Kidney Health Microalbumin/Cr Ratio  03/11/2023    Medicare Annual Wellness Visit - Calendar Year Insurers  03/11/2023    Influenza Vaccine (1) 11/09/2023    Covid-19 Vaccine (4 - 2025-26 season) 11/09/2023    Diabetic A1C  05/23/2024    Depression Screening  07/22/2024    Diabetic Kidney Health eGFR  11/23/2024    Adult Tdap-Td (2 - Td or Tdap) 05/16/2030    Hepatitis C screening  Completed    Shingles Vaccine  Completed   We did review patient diabetes status and need for continued diet, exercise and regular follow up with labs and A1c. We did rec yearly ophthalmology exams and also regular foot exam and did review foot care. Patient will continue to monitor blood glucose and call with any issues or concerns.     No follow-ups on file.    Deward BRAVO [employer] DO                 [1] No Known Allergies

## 2023-11-25 NOTE — Telephone Encounter (Signed)
 Advised patient of lab results  Linda [name], Linda [name]  11/25/2023 11:57

## 2023-11-25 NOTE — Telephone Encounter (Signed)
-----   Message from [employer] DO sent at 11/24/2023  9:35 PM EDT -----  a1c is 7.0, thyroid  panel is normal , keep follow up to review  ----- Message -----  From: Lab, Background User  Sent: 11/24/2023   1:01 PM EDT  To: Deward BRAVO Means Jr., DO

## 2023-11-26 ENCOUNTER — Other Ambulatory Visit: Payer: Self-pay

## 2023-11-26 ENCOUNTER — Encounter (INDEPENDENT_AMBULATORY_CARE_PROVIDER_SITE_OTHER): Payer: Self-pay | Admitting: Family Medicine

## 2023-11-26 ENCOUNTER — Ambulatory Visit (INDEPENDENT_AMBULATORY_CARE_PROVIDER_SITE_OTHER): Payer: Self-pay | Admitting: Family Medicine

## 2023-11-26 VITALS — BP 118/62 | HR 56 | Temp 97.6°F | Ht 66.0 in | Wt 165.8 lb

## 2023-11-26 DIAGNOSIS — E119 Type 2 diabetes mellitus without complications: Secondary | ICD-10-CM

## 2023-11-26 DIAGNOSIS — M353 Polymyalgia rheumatica: Secondary | ICD-10-CM

## 2023-11-26 DIAGNOSIS — Z0000 Encounter for general adult medical examination without abnormal findings: Secondary | ICD-10-CM

## 2023-11-26 DIAGNOSIS — R569 Unspecified convulsions: Secondary | ICD-10-CM

## 2023-11-26 DIAGNOSIS — M25571 Pain in right ankle and joints of right foot: Secondary | ICD-10-CM

## 2023-11-26 DIAGNOSIS — R2689 Other abnormalities of gait and mobility: Secondary | ICD-10-CM

## 2023-11-26 DIAGNOSIS — M5416 Radiculopathy, lumbar region: Secondary | ICD-10-CM

## 2023-11-26 DIAGNOSIS — E782 Mixed hyperlipidemia: Secondary | ICD-10-CM

## 2023-11-26 DIAGNOSIS — Z8673 Personal history of transient ischemic attack (TIA), and cerebral infarction without residual deficits: Secondary | ICD-10-CM

## 2023-11-26 DIAGNOSIS — E039 Hypothyroidism, unspecified: Secondary | ICD-10-CM

## 2023-11-26 DIAGNOSIS — G4733 Obstructive sleep apnea (adult) (pediatric): Secondary | ICD-10-CM

## 2023-11-26 DIAGNOSIS — E854 Organ-limited amyloidosis: Secondary | ICD-10-CM

## 2023-11-26 DIAGNOSIS — I10 Essential (primary) hypertension: Secondary | ICD-10-CM

## 2023-11-26 DIAGNOSIS — Z7984 Long term (current) use of oral hypoglycemic drugs: Secondary | ICD-10-CM

## 2023-11-26 NOTE — Nursing Note (Signed)
 11/26/23 1300   Medicare Wellness Assessment   Medicare initial or wellness physical in the last year? No   Advance Directives   Does patient have a living will or MPOA Yes   Has patient provided [employer] with a copy? No   Patient advised to bring in copy. Yes   Activities of Daily Living   Do you need help with dressing, bathing, or walking? No   Do you need help with shopping, housekeeping, medications, or finances? No   Do you have rugs in hallways, broken steps, or poor lighting? No   Do you have grab bars in your bathroom, non-slip strips in your tub, and hand rails on your stairs? Yes   Cognitive Function Screen   What is you age? 1   What is the time to the nearest hour? 1   What is the year? 1   What is the name of this clinic? 1   Can the patient recognize two persons (the doctor, the nurse, home help, etc.)? 1   What is the date of your birth? (day and month sufficient)  1   In what year did World War II end? 1   Who is the current president of the [place] ? 1   Count from 20 down to 1? 1   What address did I give you earlier? 0   Total Score 9   Depression Screen   Little interest or pleasure in doing things. 0   Feeling down, depressed, or hopeless 0   PHQ 2 Total 0   Pain Score   Pain Score Two   Substance Use Screening   In Past 12 MONTHS, how often have you used any tobacco product (for example, cigarettes, e-cigarettes, cigars, pipes, or smokeless tobacco)? Never   In the PAST 12 MONTHS, how often have you had 5 (men)/4 (women) or more drinks containing alcohol in one day? Never   In the PAST 12 months, how often have you used any prescription medications just for the feeling, more than prescribed, or that were not prescribed for you? Prescriptions may include: opioids, benzodiazepines, medications for ADHD Never   In the PAST 12 MONTHS, how often have you used any drugs, including marijuana, cocaine or crack, heroin, methamphetamine, hallucinogens, ecstasy/MDMA? Never   Fall Risk  Assessment   Do you feel unsteady when standing or walking? Yes   Do you worry about falling? Yes   Have you fallen in the past year? No   Urinary Incontinence Screen   Do you ever leak urine when you don't want to? YES

## 2023-11-26 NOTE — Patient Instructions (Signed)
 Medicare Preventive Services  Medicare coverage information Recommendation for YOU   Heart Disease and Diabetes   Lipid profile Every 5 years or more often if at risk for cardiovascular disease     Lab Results   Component Value Date    CHOLESTEROL 187 11/24/2023    HDLCHOL 47 (L) 11/24/2023    LDLCHOL 116 (H) 11/24/2023    LDLCHOLDIR 105 01/09/2020    TRIG 137 11/24/2023         Diabetes Screening    Yearly for those at risk for diabetes, 2 tests per year for those with prediabetes Last Glucose: 167    Diabetes Self Management Training or Medical Nutrition Therapy  For those with diabetes, up to 10 hrs initial training within a year, subsequent years up to 2 hrs of follow up training Optional for those with diabetes     Medical Nutrition Therapy  Three hours of one-on-one counseling in first year, two hours in subsequent years Optional for those with diabetes, kidney disease   Intensive Behavioral Therapy for Obesity  Face-to-face counseling, first month every week, month 2-6 every other week, month 7-12 every month if continued progress is documented Optional for those with Body Mass Index 30 or higher  Your There is no height or weight on file to calculate BMI.   Tobacco Cessation (Quitting) Counseling   Covers up to 8 smoking and tobacco-use cessation counseling sessions in a 41-month period.    Optional for those that use tobacco   Cancer Screening Last Completion Date   Colorectal screening   For anyone age 79 to 43 or any age if high risk:  Screening Colonoscopy every 10 yrs if low risk,  more frequent if higher risk  OR  Cologuard Stool DNA test once every 3 years OR  Fecal Occult Blood Testing yearly OR  Flexible  Sigmoidoscopy  every 5 yr OR  CT Colonography every 5 yrs      See below for due date if applicable.   Screening Pap Test   Recommended every 3 years for all women age 62 to 20, or every five years if combined with HPV test (routine screening not needed after total hysterectomy).  Medicare covers  every 2 years or yearly if high risk.  Screening Pelvic Exam   Medicare covers every 2 years, yearly if high risk or childbearing age with abnormal Pap in last 3 yrs.     See below for due date if applicable.   Screening Mammogram   Recommended every 2 years for women age 19 to 27, or more frequent if you have a higher risk. Selectively recommended for women between 40-49 based on shared decisions about risk. Covered by Medicare up to every year for women age 74 or older   See below for due date if applicable.         Lung Cancer Screening  Annual low dose computed tomography (LDCT scan) is recommended for those age 30-80 who smoked 20 pack-years and are current smokers or quit smoking within past 15 years, after counseling by your doctor or nurse clinician about the possible benefits or harms.     See below for due date if applicable.   Vaccinations   Respiratory syncytial virus (RSV)  Age 73 years or older: Based on shared clinical decision-making with your provider.  Pneumococcal Vaccine  Recommended routinely age 85+ with one or two separate vaccines based on your risk. Recommended before age 9 if medical conditions with increased risk  Seasonal Influenza Vaccine  Once every flu season   Hepatitis B Vaccine  3 doses if risk (including anyone with diabetes or liver disease)  Shingles Vaccine  Two doses at age 63 or older  Diphtheria Tetanus Pertussis Vaccine  ONCE as adult, booster every 10 years     Immunization History   Administered Date(s) Administered    Covid-19 Vaccine,Moderna,12 Years+ 04/06/2019, 05/06/2019, 01/04/2020    Influenza Vaccine, 6 month-adult 12/19/2016, 01/12/2020, 12/13/2020, 01/06/2023    PREVNAR 13  12/19/2016    Shingrix - Zoster Vaccine 12/12/2021     Shingles vaccine and Diphtheria Tetanus Pertussis vaccines are available at pharmacies or local health department without a prescription.   Other Preventative Screening  Last Completion Date   Bone Densitometry   Screening: All females  ages 50 and older every 10 years if initial screening normal. Postmenopausal women ages 49-64 need screening with one or more risk factor: previous fracture, parental hip fracture, current smoker, low body weight, excessive alcohol use, Rheumatoid Arthritis   For women with diagnosed Osteoporosis, follow up is recommended every 2 years or a frequency recommended by your provider.       See below for due date if applicable.     Glaucoma Screening   Yearly if in high risk group such as diabetes, family history, African American age 51+ or Hispanic American age 86+   See your eye care provider for screening.   Hepatitis C Screening   Recommended  for those born between ages 18-79 years.   --09/22/2022  See below for due date if applicable.     HIV Testing  Recommended routinely at least ONCE, covered every year for age 51 to 83 regardless of risk, and every year for age over 15 who ask for the test or higher risk. Yearly or up to 3 times in pregnancy         See below for due date if applicable.   Abdominal Aortic Aneurysm Screening Ultrasound   Once with a family history of abdominal aortic aneurysms OR a female between 89-75 and have smoked at least 100 cigarettes in your lifetime.         See below for due date if applicable.       Your Personalized Schedule for Preventive Tests     Health Maintenance: Pending and Last Completed         Date Due Completion Date    Diabetic Retinal Exam Never done ---    Osteoporosis screening Never done ---    Pneumococcal Vaccination, Age 2+ (2 of 2 - PPSV23, PCV20, or PCV21) 02/13/2017 12/19/2016    RSV Adult 60+ or Pregnancy (1 - 1-dose 75+ series) Never done ---    Diabetic Kidney Health Microalbumin/Cr Ratio 03/11/2023 09/22/2022    Medicare Annual Wellness Visit - Calendar Year Insurers 03/11/2023 06/18/2022    Influenza Vaccine (1) 11/09/2023 01/06/2023    Covid-19 Vaccine (4 - 2025-26 season) 11/09/2023 01/04/2020    Diabetic A1C 05/23/2024 11/24/2023    Diabetic Kidney Health  eGFR 11/23/2024 11/24/2023    Depression Screening 11/25/2024 11/26/2023    Adult Tdap-Td (2 - Td or Tdap) 05/16/2030 05/15/2020                  For Information on Advanced Directives for Health Care:  [place]:  [email or web address]  [place], [place], [place], [place] General Information: [email or web address]

## 2023-11-27 ENCOUNTER — Telehealth (INDEPENDENT_AMBULATORY_CARE_PROVIDER_SITE_OTHER): Payer: Self-pay | Admitting: Family Medicine

## 2023-11-27 ENCOUNTER — Other Ambulatory Visit: Attending: Family Medicine | Admitting: Family Medicine

## 2023-11-27 LAB — MICROALBUMIN/CREATININE RATIO, URINE, RANDOM
CREATININE RANDOM URINE: 58.66 mg/dL
MICROALBUMIN RANDOM URINE: 0.5 mg/dL
MICROALBUMIN/CREATININE RATIO RANDOM URINE: 8.5 mg/g (ref <30.0)

## 2023-11-27 NOTE — Telephone Encounter (Signed)
 I called patient and LVM, asking where she wants to go for Physical therapy.SABRASABRA

## 2023-12-02 ENCOUNTER — Telehealth (INDEPENDENT_AMBULATORY_CARE_PROVIDER_SITE_OTHER): Payer: Self-pay | Admitting: Family Medicine

## 2023-12-02 NOTE — Telephone Encounter (Signed)
 I have tried all 3 phone numbers in patients chart to find out where she wants to go for PT.   LVM   LLT

## 2023-12-31 ENCOUNTER — Other Ambulatory Visit (INDEPENDENT_AMBULATORY_CARE_PROVIDER_SITE_OTHER): Payer: Self-pay | Admitting: Family Medicine

## 2024-02-11 ENCOUNTER — Ambulatory Visit (HOSPITAL_COMMUNITY): Payer: Self-pay

## 2024-02-16 ENCOUNTER — Other Ambulatory Visit (INDEPENDENT_AMBULATORY_CARE_PROVIDER_SITE_OTHER): Payer: Self-pay | Admitting: Family Medicine

## 2024-02-22 ENCOUNTER — Other Ambulatory Visit (INDEPENDENT_AMBULATORY_CARE_PROVIDER_SITE_OTHER): Payer: Self-pay | Admitting: Family Medicine

## 2024-02-22 MED ORDER — PROPRANOLOL 10 MG TABLET: 10.0000 mg | ORAL_TABLET | Freq: Two times a day (BID) | ORAL | 3 refills | Status: AC

## 2024-02-22 NOTE — Telephone Encounter (Signed)
 Regarding: Refill Request  ----- Message from Frazier RAMAN sent at 02/22/2024  8:22 AM EST -----  Copied From CRM [phone].  Munter, Janiyha (Self) called to request a prescription refill.     Preferred Pharmacy     [facility] [address], [place] - [address]    [address] [place] [address]    Phone: [phone] Fax: [phone]    Hours: Not open 24 hours         Disp Refills Start End   propranoloL  (INDERAL ) 10 mg Oral Tablet 180 Tablet 3 10/08/2022 -   Sig - Route: Take 1 Tablet (10 mg total) by mouth Twice daily - Oral   Sent to pharmacy as: propranoloL  10 mg tablet (INDERAL )   Class: E-Rx

## 2024-03-14 ENCOUNTER — Encounter (INDEPENDENT_AMBULATORY_CARE_PROVIDER_SITE_OTHER): Payer: Self-pay | Admitting: Family Medicine

## 2024-03-23 ENCOUNTER — Ambulatory Visit (INDEPENDENT_AMBULATORY_CARE_PROVIDER_SITE_OTHER): Payer: Self-pay | Admitting: Family Medicine

## 2024-03-23 NOTE — Telephone Encounter (Signed)
 Faxed back.

## 2024-03-23 NOTE — Telephone Encounter (Signed)
 Regarding: Clinical Question  ----- Message from Vickie C sent at 03/23/2024  9:12 AM EST -----  Copied From CRM #[id number].[employer] () called with a clinical question.     Prior auth information needed.  How often does patient inject?    Fax [phone]    Mounjaro 

## 2024-04-15 ENCOUNTER — Encounter (INDEPENDENT_AMBULATORY_CARE_PROVIDER_SITE_OTHER): Payer: Self-pay | Admitting: Family Medicine

## 2024-07-21 ENCOUNTER — Encounter (INDEPENDENT_AMBULATORY_CARE_PROVIDER_SITE_OTHER): Admitting: Family Medicine
